# Patient Record
Sex: Female | Born: 1935 | Race: Black or African American | Hispanic: No | Marital: Married | Smoking: Former smoker
Health system: Southern US, Community
[De-identification: ages and names within clinical notes are randomized; demographics above are authoritative.]

## PROBLEM LIST (undated history)

## (undated) DIAGNOSIS — E119 Type 2 diabetes mellitus without complications: Secondary | ICD-10-CM

## (undated) DIAGNOSIS — F0390 Unspecified dementia without behavioral disturbance: Secondary | ICD-10-CM

## (undated) DIAGNOSIS — M1990 Unspecified osteoarthritis, unspecified site: Secondary | ICD-10-CM

---

## 2004-03-08 ENCOUNTER — Ambulatory Visit: Payer: Self-pay | Admitting: Unknown Physician Specialty

## 2005-02-28 ENCOUNTER — Ambulatory Visit: Payer: Self-pay | Admitting: Gastroenterology

## 2005-03-19 ENCOUNTER — Ambulatory Visit: Payer: Self-pay | Admitting: Unknown Physician Specialty

## 2006-04-16 ENCOUNTER — Ambulatory Visit: Payer: Self-pay | Admitting: Unknown Physician Specialty

## 2007-04-22 ENCOUNTER — Ambulatory Visit: Payer: Self-pay | Admitting: Unknown Physician Specialty

## 2008-01-21 ENCOUNTER — Ambulatory Visit: Payer: Self-pay | Admitting: Rheumatology

## 2008-02-06 ENCOUNTER — Ambulatory Visit: Payer: Self-pay

## 2008-03-16 ENCOUNTER — Ambulatory Visit: Payer: Self-pay | Admitting: Unknown Physician Specialty

## 2008-03-17 ENCOUNTER — Inpatient Hospital Stay: Payer: Self-pay | Admitting: Unknown Physician Specialty

## 2008-08-03 ENCOUNTER — Ambulatory Visit: Payer: Self-pay | Admitting: Unknown Physician Specialty

## 2008-08-17 ENCOUNTER — Ambulatory Visit: Payer: Self-pay | Admitting: Unknown Physician Specialty

## 2009-08-21 ENCOUNTER — Ambulatory Visit: Payer: Self-pay | Admitting: Unknown Physician Specialty

## 2010-07-19 ENCOUNTER — Ambulatory Visit: Payer: Self-pay | Admitting: Gastroenterology

## 2010-09-03 ENCOUNTER — Ambulatory Visit: Payer: Self-pay | Admitting: Unknown Physician Specialty

## 2011-11-26 ENCOUNTER — Ambulatory Visit: Payer: Self-pay | Admitting: Unknown Physician Specialty

## 2012-12-01 ENCOUNTER — Ambulatory Visit: Payer: Self-pay | Admitting: Physician Assistant

## 2013-05-20 HISTORY — PX: CARDIAC SURGERY: SHX584

## 2013-05-21 ENCOUNTER — Ambulatory Visit: Payer: Self-pay | Admitting: Cardiology

## 2013-09-24 DIAGNOSIS — E119 Type 2 diabetes mellitus without complications: Secondary | ICD-10-CM | POA: Insufficient documentation

## 2013-09-24 DIAGNOSIS — I351 Nonrheumatic aortic (valve) insufficiency: Secondary | ICD-10-CM | POA: Insufficient documentation

## 2013-09-24 DIAGNOSIS — I10 Essential (primary) hypertension: Secondary | ICD-10-CM | POA: Insufficient documentation

## 2013-09-24 DIAGNOSIS — M1990 Unspecified osteoarthritis, unspecified site: Secondary | ICD-10-CM | POA: Insufficient documentation

## 2013-09-24 DIAGNOSIS — E039 Hypothyroidism, unspecified: Secondary | ICD-10-CM | POA: Insufficient documentation

## 2013-09-24 DIAGNOSIS — I340 Nonrheumatic mitral (valve) insufficiency: Secondary | ICD-10-CM | POA: Insufficient documentation

## 2013-09-24 DIAGNOSIS — R0602 Shortness of breath: Secondary | ICD-10-CM | POA: Insufficient documentation

## 2013-09-24 DIAGNOSIS — I350 Nonrheumatic aortic (valve) stenosis: Secondary | ICD-10-CM | POA: Insufficient documentation

## 2013-09-28 DIAGNOSIS — I4510 Unspecified right bundle-branch block: Secondary | ICD-10-CM | POA: Insufficient documentation

## 2014-02-18 DIAGNOSIS — I442 Atrioventricular block, complete: Secondary | ICD-10-CM | POA: Insufficient documentation

## 2014-02-18 DIAGNOSIS — I9789 Other postprocedural complications and disorders of the circulatory system, not elsewhere classified: Secondary | ICD-10-CM

## 2014-03-01 DIAGNOSIS — Z952 Presence of prosthetic heart valve: Secondary | ICD-10-CM | POA: Insufficient documentation

## 2014-03-11 DIAGNOSIS — Z950 Presence of cardiac pacemaker: Secondary | ICD-10-CM | POA: Insufficient documentation

## 2014-05-26 ENCOUNTER — Observation Stay: Payer: Self-pay | Admitting: Internal Medicine

## 2014-05-26 LAB — COMPREHENSIVE METABOLIC PANEL
ANION GAP: 8 (ref 7–16)
Albumin: 3.9 g/dL (ref 3.4–5.0)
Alkaline Phosphatase: 72 U/L
BILIRUBIN TOTAL: 0.4 mg/dL (ref 0.2–1.0)
BUN: 12 mg/dL (ref 7–18)
CO2: 26 mmol/L (ref 21–32)
CREATININE: 0.86 mg/dL (ref 0.60–1.30)
Calcium, Total: 9.7 mg/dL (ref 8.5–10.1)
Chloride: 107 mmol/L (ref 98–107)
Glucose: 95 mg/dL (ref 65–99)
OSMOLALITY: 281 (ref 275–301)
POTASSIUM: 3.9 mmol/L (ref 3.5–5.1)
SGOT(AST): 18 U/L (ref 15–37)
SGPT (ALT): 22 U/L
SODIUM: 141 mmol/L (ref 136–145)
Total Protein: 8.1 g/dL (ref 6.4–8.2)

## 2014-05-26 LAB — LIPID PANEL
Cholesterol: 204 mg/dL — ABNORMAL HIGH (ref 0–200)
HDL Cholesterol: 104 mg/dL — ABNORMAL HIGH (ref 40–60)
Ldl Cholesterol, Calc: 91 mg/dL (ref 0–100)
TRIGLYCERIDES: 45 mg/dL (ref 0–200)
VLDL CHOLESTEROL, CALC: 9 mg/dL (ref 5–40)

## 2014-05-26 LAB — PROTIME-INR
INR: 1
Prothrombin Time: 13.3 secs (ref 11.5–14.7)

## 2014-05-26 LAB — CBC WITH DIFFERENTIAL/PLATELET
Basophil #: 0 10*3/uL (ref 0.0–0.1)
Basophil %: 0.4 %
Eosinophil #: 0.1 10*3/uL (ref 0.0–0.7)
Eosinophil %: 1.2 %
HCT: 37.1 % (ref 35.0–47.0)
HGB: 11.7 g/dL — ABNORMAL LOW (ref 12.0–16.0)
LYMPHS ABS: 1.4 10*3/uL (ref 1.0–3.6)
LYMPHS PCT: 31.2 %
MCH: 30.4 pg (ref 26.0–34.0)
MCHC: 31.5 g/dL — ABNORMAL LOW (ref 32.0–36.0)
MCV: 97 fL (ref 80–100)
Monocyte #: 0.5 x10 3/mm (ref 0.2–0.9)
Monocyte %: 10.2 %
Neutrophil #: 2.6 10*3/uL (ref 1.4–6.5)
Neutrophil %: 57 %
PLATELETS: 183 10*3/uL (ref 150–440)
RBC: 3.84 10*6/uL (ref 3.80–5.20)
RDW: 17.1 % — AB (ref 11.5–14.5)
WBC: 4.5 10*3/uL (ref 3.6–11.0)

## 2014-05-26 LAB — TROPONIN I

## 2014-05-26 LAB — SEDIMENTATION RATE: Erythrocyte Sed Rate: 17 mm/hr (ref 0–30)

## 2014-05-26 LAB — APTT: ACTIVATED PTT: 35.1 s (ref 23.6–35.9)

## 2014-05-27 LAB — CBC WITH DIFFERENTIAL/PLATELET
Basophil #: 0 10*3/uL (ref 0.0–0.1)
Basophil %: 0.3 %
EOS PCT: 2.4 %
Eosinophil #: 0.1 10*3/uL (ref 0.0–0.7)
HCT: 34.2 % — ABNORMAL LOW (ref 35.0–47.0)
HGB: 11 g/dL — ABNORMAL LOW (ref 12.0–16.0)
Lymphocyte #: 2.3 10*3/uL (ref 1.0–3.6)
Lymphocyte %: 46.8 %
MCH: 30.5 pg (ref 26.0–34.0)
MCHC: 32.2 g/dL (ref 32.0–36.0)
MCV: 95 fL (ref 80–100)
MONOS PCT: 12.3 %
Monocyte #: 0.6 x10 3/mm (ref 0.2–0.9)
NEUTROS PCT: 38.2 %
Neutrophil #: 1.9 10*3/uL (ref 1.4–6.5)
PLATELETS: 182 10*3/uL (ref 150–440)
RBC: 3.6 10*6/uL — ABNORMAL LOW (ref 3.80–5.20)
RDW: 16.7 % — ABNORMAL HIGH (ref 11.5–14.5)
WBC: 4.9 10*3/uL (ref 3.6–11.0)

## 2014-05-27 LAB — BASIC METABOLIC PANEL
ANION GAP: 6 — AB (ref 7–16)
BUN: 11 mg/dL (ref 7–18)
CHLORIDE: 110 mmol/L — AB (ref 98–107)
Calcium, Total: 9.6 mg/dL (ref 8.5–10.1)
Co2: 27 mmol/L (ref 21–32)
Creatinine: 0.83 mg/dL (ref 0.60–1.30)
EGFR (African American): >60
EGFR (Non-African Amer.): >60
GLUCOSE: 101 mg/dL — AB (ref 65–99)
OSMOLALITY: 285 (ref 275–301)
Potassium: 3.9 mmol/L (ref 3.5–5.1)
Sodium: 143 mmol/L (ref 136–145)

## 2014-05-27 LAB — HEMOGLOBIN A1C: HEMOGLOBIN A1C: 6.2 % (ref 4.2–6.3)

## 2014-05-30 DIAGNOSIS — E782 Mixed hyperlipidemia: Secondary | ICD-10-CM | POA: Insufficient documentation

## 2014-05-30 DIAGNOSIS — I10 Essential (primary) hypertension: Secondary | ICD-10-CM | POA: Insufficient documentation

## 2014-05-30 DIAGNOSIS — E119 Type 2 diabetes mellitus without complications: Secondary | ICD-10-CM | POA: Insufficient documentation

## 2014-09-18 NOTE — H&P (Signed)
PATIENT NAME:  [name], [name] MR#:  [id number] DATE OF BIRTH:  10/06/1926  DATE OF ADMISSION:  05/26/2014  PRIMARY CARE PHYSICIAN:  Zoe Singh, MD   EMERGENCY ROOM PHYSICIAN: David Schaevitz, MD   CHIEF COMPLAINT: Vision loss in the left eye.   HISTORY OF PRESENT ILLNESS: An 81-year-old female patient with history of high blood pressure, diabetes, brought in because of the vision changes. Mainly, she lost her vision in the left eye briefly for an hour. The patient noticed she that could not see anything out of the left eye around 9:00 a.m. and it persisted until 10:00 a.m. The patient started to see things again from the left eye after 1 hour. The patient did not have any headache, did not have syncope, no weakness in hands or legs. No facial droop, no slurred speech. The patient denies any fever, did not have any previous symptoms like this. The patient has no previous history of strokes.   PAST MEDICAL HISTORY: Significant for high blood pressure, diabetes, history of pacemaker placement, recent open heart surgery at [facility] back in October. The patient is not sure of what surgery she had. , but she is unable to tell me what surgery she had. The patient also has history of hypothyroidism and hyperlipidemia.   ALLERGIES: No known allergies.   MEDICATIONS: Aspirin 81 mg daily, atorvastatin 20 mg daily, fish oil 1 gram p.o. t.i.d., levothyroxine 112 mcg p.o. daily, oxycodone 5 mg every 6 hours, Actos 30 mg p.o. daily, Tylenol 500 mg every 6 hours p.r.n. as needed for pain and fever.   PAST SURGICAL HISTORY:>, history of hysterectomy, pacemaker replacement, right knee replacement, and open heart surgery in October of last year.   SOCIAL HISTORY: The patient is an occasional smoker, quit now, never was a heavy smoker. No alcohol. No drugs. Lives alone, independent of activities of daily living.   FAMILY HISTORY: Mother has diabetes. Sisters have diabetes. Nephews have diabetes.  REVIEW OF  SYSTEMS:  CONSTITUTIONAL: The patient has no fever, no fatigue.  EYES: The patient lost vision the left eye briefly and now denies any blurred vision.  EARS, NOSE, AND THROAT: No tinnitus, no epistaxis, no difficulty swallowing.  RESPIRATORY: No cough. No wheezing.  CARDIOVASCULAR: No chest pain, no orthopnea, no PND, no pedal edema.  GASTROINTESTINAL: No nausea. No vomiting. No abdominal pain.  GENITOURINARY: No dysuria or hematuria.  ENDOCRINE: No polyuria or polydipsia.  HEMATOLOGIC: No anemia.  INTEGUMENTARY: No skin rashes. The patient has no acne.  MUSCULOSKELETAL: Denies any joint pains, no gout.  NEUROLOGIC: Denies any numbness, no dysarthria, no epilepsy, no tremors, no headache, no seizures, no TIA. The patient has no migraines. Lost vision in the left eye briefly today.  PSYCHIATRIC: No anxiety or insomnia. No nervousness or anxiety.   PHYSICAL EXAMINATION: VITAL SIGNS: Temperature 98 Fahrenheit, heart rate 86, blood pressure 193/100, and saturations 93% in room air.  GENERAL: The patient is an awake, alert, oriented elderly African American female, not in distress, answering questions appropriately.  HEAD: Normocephalic, atraumatic.  EYES: Pupils equal, reacting to light. Right now, her visual acuity is normal on vision test, able to see from both eyes. Extraocular movements are intact. The patient's pupils are equal and reacting to light. The patient has no scleral icterus, and able to see clearly from both eyes and visual field testing is normal in both eyes.  NOSE: No nasal drainage and no external lesions.  EARS: No drainage,  tympanic membranes are clear. MOUTH:   No lesions.   NECK: Supple. No JVD. No carotid bruit. Normal range of motion.  RESPIRATORY: Good respiratory effort. Clear to auscultation. No wheeze. No rales.  CARDIOVASCULAR: S1, S2 regular. PMI not displaced. The patient has no murmurs. 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 No peripheral edema.  GASTROINTESTINAL: Abdomen is soft, nontender, nondistended. Bowel sounds present. No hernias.  MUSCULOSKELETAL: Normal gait and station. No pathology. No tenderness or effusion. Normal range of motion. Strength and tone are equal bilaterally.  SKIN: Inspection is normal, well-hydrated.  NEUROLOGIC: Alert, awake, oriented. Cranial nerves II through XII are intact. Power 5/5 in upper and lower extremities. Sensation is intact. DTRs 2+ bilaterally. The patient's visual acuity,  extraocular movements, and visual field defects are completely normal. Her visual fields are completely normal in both eyes at this time.  PSYCHIATRIC: Mood and affect are within normal limits.   LABORATORY DATA: INR 1. Electrolytes: Sodium 141, potassium 3.9, chloride 107, bicarbonate 26, BUN 12, creatinine 0.86, glucose 95. LFTs within normal limits. CT head shows no intracranial hemorrhage. Small vessel-type changes without any evidence of acute ischemia. CBC: WBC 4.5, hemoglobin 11.7, hematocrit 37.1, platelets 183. EKG: Atrial pacer rhythm with a prolonged AV conduction, 82 beats per minute.   ASSESSMENT AND PLAN:  1. This is an 81-year-old female patient with amaurosis fugax with vision loss in the left eye. Differential includes possible emboli versus Central retinal artery occlusion versus possible giant cell arteritis versus vasculitis. The patient is admitted to telemetry. We were going to do carotid ultrasound, echocardiogram, and MRI of the brain to evaluate for stroke. The patient is also on telemetry for (possible arrhythmias.  2. Possible giant cell arteritis. That can cause transient monocular vision loss, so I ordered ESR and CRP, and I called ophthalmology on call. He suggested that if the patient  vision is back to normal, he would see her in the office.  I called and talked to her grandson .  3. The patient may need outpatient fundoscopy evaluation to evaluate for any retinal emboli   The patient is awaiting carotid ultrasound to evaluate for any thromboembolic phenomena, and MRIof the brain and echocardiogram. The patient had a heart surgery, but unable to tell me what surgery she had. If the work-up is negative, she may need a TEE. The patient is right now on aspirin and statins. Continue them, and further work-up based on the testing, and a neurology consult will be ordered, and the patient will be signed out to [facility].  4. Continue other medications for hypothyroidism and hyperlipidemia.   TIME SPENT: 55 minutes.    ____________________________  , MD sk:mw D: 05/26/2014 18:12:27 ET T: 05/26/2014 19:04:38 ET JOB#: [id number]  cc:  , MD, <Dictator>   MD ELECTRONICALLY SIGNED 06/22/2014 16:15 

## 2014-09-20 ENCOUNTER — Encounter: Payer: Medicare Other | Attending: Cardiology | Admitting: *Deleted

## 2014-09-20 ENCOUNTER — Encounter: Payer: Self-pay | Admitting: *Deleted

## 2014-09-20 VITALS — Ht 68.0 in | Wt 197.2 lb

## 2014-09-20 DIAGNOSIS — Z954 Presence of other heart-valve replacement: Secondary | ICD-10-CM | POA: Insufficient documentation

## 2014-09-20 DIAGNOSIS — Z952 Presence of prosthetic heart valve: Secondary | ICD-10-CM

## 2014-09-20 NOTE — Patient Instructions (Signed)
Patient Instructions  Patient Details  Name: Zoe [name] MRN: [id number] Date of Birth: [birth year] Referring Provider:  Dalia HeadingFath, Kenneth A, MD  Below are the personal goals you chose as well as exercise and nutrition goals. Our goal is to help you keep on track towards obtaining and maintaining your goals. We will be discussing your progress on these goals with you throughout the program.  Initial Exercise Prescription:     Initial Exercise Prescription - 09/20/14 1000    Treadmill   MPH 1.5   Grade 0   Minutes 10   Bike   Level 0.4   Minutes 10   Recumbant Bike   Level 2   Watts 15   Minutes 10   NuStep   Level 2   Watts 35   Minutes 15   Arm Ergometer   Level 1   Watts 8   Minutes 10   Arm/Foot Ergometer   Level 2   Watts 10   Minutes 10   Cybex   Level 1   RPM 50   Minutes 10   Recumbant Elliptical   Level 1   RPM 40   Watts 10   Minutes 10   REL-XR   Level 1   Watts 20   Minutes 10   Prescription Details   Frequency (times per week) 3   Duration Progress to 30 minutes of continuous aerobic without signs/symptoms of physical distress   Intensity   THRR REST +  20   Ratings of Perceived Exertion 11-15   Progression Continue progressive overload as per policy without signs/symptoms or physical distress.   Resistance Training   Training Prescription Yes   Weight 1      Exercise Goals: Frequency: Be able to perform aerobic exercise three times per week working toward 3-5 days per week.  Intensity: Work with a perceived exertion of 11 (fairly light) - 15 (hard) as tolerated. Follow your new exercise prescription and watch for changes in prescription as you progress with the program. Changes will be reviewed with you when they are made.  Duration: You should be able to do 30 minutes of continuous aerobic exercise in addition to a 5 minute warm-up and a 5 minute cool-down routine.  Nutrition Goals: Your personal nutrition goals will be established  when you do your nutrition analysis with the dietician.  The following are nutrition guidelines to follow: Cholesterol < 200mg /day Sodium < 1500mg /day Fiber: Women over 50 yrs - 21 grams per day  Personal Goals:     Personal Goals and Risk Factors at Admission - 09/20/14 0908    Personal Goals and Risk Factors on Admission    Weight Management Yes   Intervention Learn and follow the exercise and diet guidelines while in the program. Utilize the nutrition and education classes to help gain knowledge of the diet and exercise expectations in the program   Increase Aerobic Exercise and Physical Activity Yes;Sedentary   Intervention While in program, learn and follow the exercise prescription taught. Start at a low level workload and increase workload after able to maintain previous level for 30 minutes. Increase time before increasing intensity.   Intervention Provide exercise education and an individualized exercise prescription that will provide continued progressive overload as per policy without signs/symptoms of physical distress.   Diabetes Yes   Goal Blood glucose control identified by blood glucose values, HgbA1C. Participant verbalizes understanding of the signs/symptoms of hyper/hypo glycemia, proper foot care and importance of medication and nutrition  plan for blood glucose control.   Intervention Provide nutrition & aerobic exercise along with prescribed medications to achieve blood glucose in normal ranges: Fasting 65-99 mg/dL   Hypertension Yes   Goal Participant will see blood pressure controlled within the values of 140/28mm/Hg or within value directed by their physician.   Intervention Provide nutrition & aerobic exercise along with prescribed medications to achieve BP 140/90 or less.   Lipids Yes   Goal Cholesterol controlled with medications as prescribed, with individualized exercise RX and with personalized nutrition plan. Value goals: LDL < , HDL > . Participant  states understanding of desired cholesterol values and following prescriptions.   Intervention Provide nutrition & aerobic exercise along with prescribed medications to achieve LDL 70mg , HDL >40mg .   Stress No      Tobacco Use Initial Evaluation: History  Smoking status  . Former Smoker -- 0.25 packs/day for 5 years  . Types: Cigarettes  . Quit date: 05/22/1968  Smokeless tobacco  . Not on file    Copy of goals given to participant.

## 2014-09-20 NOTE — Progress Notes (Signed)
Cardiac Individual Treatment Plan  Patient Details  Name: Zoe [name] MRN: [id number] Date of Birth: 1927/02/20 Referring Provider:  Dalia Heading, MD  Initial Encounter Date:   09/20/2014   Visit Diagnosis: S/P aortic valve replacement    Patient's Home Medications on Admission:  Current outpatient prescriptions:  .  atorvastatin (LIPITOR) 20 MG tablet, Take by mouth., Disp: , Rfl:  .  Bioflavonoid Products (BIOFLEX PO), Take 1 tablet by mouth every morning., Disp: , Rfl:  .  levothyroxine (SYNTHROID, LEVOTHROID) 88 MCG tablet, TAKE 1 TABLET DAILY ON AN EMPTY STOMACH WITH A GLASS OF WATER AT LEAST 30 TO 60 MINUTES BEFORE BREAKFAST, Disp: , Rfl:  .  metoprolol succinate (TOPROL-XL) 50 MG 24 hr tablet, Take by mouth., Disp: , Rfl:  .  Omega-3 Fatty Acids (FISH OIL) 1000 MG CAPS, Take 2 capsules by mouth every morning., Disp: , Rfl:  .  pioglitazone (ACTOS) 30 MG tablet, TAKE 1 TABLET EVERY MORNING, Disp: , Rfl:  .  acetaminophen (TYLENOL) 325 MG tablet, Take by mouth., Disp: , Rfl:  .  clopidogrel (PLAVIX) 75 MG tablet, Take by mouth., Disp: , Rfl:   Past Medical History: Hypertension Hyperlipidemia Diabetes Pacemaker   11/2013 Complete heart block Hypothyriodism AVR-02/2014   Tobacco Use: History  Smoking status  . Former Smoker -- 0.25 packs/day for 5 years  . Types: Cigarettes  . Quit date: 05/22/1968  Smokeless tobacco  . Not on file    Labs: Recent Review Flowsheet Data    There is no flowsheet data to display.       Exercise Target Goals:    Exercise Program Goal: Individual exercise prescription set with THRR, safety & activity barriers. Participant demonstrates ability to understand and report RPE using BORG scale, to self-measure pulse accurately, and to acknowledge the importance of the exercise prescription.  Exercise Prescription Goal: Starting with aerobic activity 30 plus minutes a day, 3 days per week for initial exercise prescription.  Provide home exercise prescription and guidelines that participant acknowledges understanding prior to discharge.  Activity Barriers & Risk Stratification:   6 Minute Walk:     6 Minute Walk      09/20/14 1002       6 Minute Walk   Phase Initial     Distance 1070 feet     Walk Time 6 minutes     Resting HR 84 bpm     Resting BP 162/88 mmHg     Max Ex. HR 111 bpm     Max Ex. BP 162/90 mmHg     RPE 11     Symptoms No        Initial Exercise Prescription:     Initial Exercise Prescription - 09/20/14 1000    Treadmill   MPH 1.5   Grade 0   Minutes 10   Bike   Level 0.4   Minutes 10   Recumbant Bike   Level 2   Watts 15   Minutes 10   NuStep   Level 2   Watts 35   Minutes 15   Arm Ergometer   Level 1   Watts 8   Minutes 10   Arm/Foot Ergometer   Level 2   Watts 10   Minutes 10   Cybex   Level 1   RPM 50   Minutes 10   Recumbant Elliptical   Level 1   RPM 40   Watts 10   Minutes 10   REL-XR  Level 1   Watts 20   Minutes 10   Prescription Details   Frequency (times per week) 3   Duration Progress to 30 minutes of continuous aerobic without signs/symptoms of physical distress   Intensity   THRR REST +  20   Ratings of Perceived Exertion 11-15   Progression Continue progressive overload as per policy without signs/symptoms or physical distress.   Resistance Training   Training Prescription Yes   Weight 1      Exercise Prescription Changes:   Discharge Exercise Prescription:   Nutrition:  Target Goals: Understanding of nutrition guidelines, daily intake of sodium 1500mg , cholesterol 200mg , calories 30% from fat and 7% or less from saturated fats, daily to have 5 or more servings of fruits and vegetables.  Biometrics:     Pre Biometrics - 09/20/14 1010    Pre Biometrics   Height  (1.727 m)   Weight 197 lb 3.2 oz (89.449 kg)   Waist Circumference 33 inches   Hip Circumference 44.5 inches   Waist to Hip Ratio 0.74 %   BMI  (Calculated) 30       Nutrition Therapy Plan and Nutrition Goals:   Nutrition Discharge: Rate Your Plate Scores:   Nutrition Goals Re-Evaluation:   Psychosocial: Target Goals: Acknowledge presence or absence of depression, maximize coping skills, provide positive support system. Participant is able to verbalize types and ability to use techniques and skills needed for reducing stress and depression.  Initial Review & Psychosocial Screening:     Initial Psych Review & Screening - 09/20/14 0912    Family Dynamics   Good Support System? Yes   Barriers   Psychosocial barriers to participate in program There are no identifiable barriers or psychosocial needs.;The patient should benefit from training in stress management and relaxation.   Screening Interventions   Interventions Encouraged to exercise      Quality of Life Scores:   PHQ-9:     Recent Review Flowsheet Data    Depression screen [facility] 2/9 09/20/2014   Decreased Interest 2   Down, Depressed, Hopeless 0   PHQ - 2 Score 2   Altered sleeping 0   Tired, decreased energy 0   Change in appetite 0   Feeling bad or failure about yourself  0   Trouble concentrating 0   Moving slowly or fidgety/restless 0   Suicidal thoughts 0   PHQ-9 Score 2   Difficult doing work/chores Not difficult at all      Psychosocial Evaluation and Intervention:   Psychosocial Re-Evaluation:   Vocational Rehabilitation: Provide vocational rehab assistance to qualifying candidates.   Vocational Rehab Evaluation & Intervention:     Vocational Rehab - 09/20/14 0903    Initial Vocational Rehab Evaluation & Intervention   Assessment shows need for Vocational Rehabilitation No      Education: Education Goals: Education classes will be provided on a weekly basis, covering required topics. Participant will state understanding/return demonstration of topics presented.  Learning Barriers/Preferences:     Learning  Barriers/Preferences - 09/20/14 1610    Learning Barriers/Preferences   Learning Barriers None   Learning Preferences Written Material      Education Topics: General Nutrition Guidelines/Fats and Fiber: -Group instruction provided by verbal, written material, models and posters to present the general guidelines for heart healthy nutrition. Gives an explanation and review of dietary fats and fiber.   Controlling Sodium/Reading Food Labels: -Group verbal and written material supporting the discussion of sodium use in heart healthy  nutrition. Review and explanation with models, verbal and written materials for utilization of the food label.   Exercise Physiology & Risk Factors: - Group verbal and written instruction with models to review the exercise physiology of the cardiovascular system and associated critical values. Details cardiovascular disease risk factors and the goals associated with each risk factor.   Aerobic Exercise & Resistance Training: - Gives group verbal and written discussion on the health impact of inactivity. On the components of aerobic and resistive training programs and the benefits of this training and how to safely progress through these programs.   Flexibility, Balance, General Exercise Guidelines: - Provides group verbal and written instruction on the benefits of flexibility and balance training programs. Provides general exercise guidelines with specific guidelines to those with heart or lung disease. Demonstration and skill practice provided.   Stress Management: - Provides group verbal and written instruction about the health risks of elevated stress, cause of high stress, and healthy ways to reduce stress.   Depression: - Provides group verbal and written instruction on the correlation between heart/lung disease and depressed mood, treatment options, and the stigmas associated with seeking treatment.   Anatomy & Physiology of the Heart: - Group  verbal and written instruction and models provide basic cardiac anatomy and physiology, with the coronary electrical and arterial systems. Review of: AMI, Angina, Valve disease, Heart Failure, Cardiac Arrhythmia, Pacemakers, and the ICD.   Cardiac Procedures: - Group verbal and written instruction and models to describe the testing methods done to diagnose heart disease. Reviews the outcomes of the test results. Describes the treatment choices: Medical Management, Angioplasty, or Coronary Bypass Surgery.   Cardiac Medications: - Group verbal and written instruction to review commonly prescribed medications for heart disease. Reviews the medication, class of the drug, and side effects. Includes the steps to properly store meds and maintain the prescription regimen.   Go Sex-Intimacy & Heart Disease, Get SMART - Goal Setting: - Group verbal and written instruction through game format to discuss heart disease and the return to sexual intimacy. Provides group verbal and written material to discuss and apply goal setting through the application of the S.M.A.R.T. Method.   Other Matters of the Heart: - Provides group verbal, written materials and models to describe Heart Failure, Angina, Valve Disease, and Diabetes in the realm of heart disease. Includes description of the disease process and treatment options available to the cardiac patient.   Exercise & Equipment Safety: - Individual verbal instruction and demonstration of equipment use and safety with use of the equipment.          Cardiac Rehab from 09/20/2014 in [facility] Cardiac Rehab   Date  09/20/14   Educator  S Duayne Brideau   Instruction Review Code  2- meets goals/outcomes      Infection Prevention: - Provides verbal and written material to individual with discussion of infection control including proper hand washing and proper equipment cleaning during exercise session.      Cardiac Rehab from 09/20/2014 in [facility] Cardiac Rehab   Date  09/20/14    Educator  S Lanaiya Lantry   Instruction Review Code  2- meets goals/outcomes      Falls Prevention: - Provides verbal and written material to individual with discussion of falls prevention and safety.      Cardiac Rehab from 09/20/2014 in [facility] Cardiac Rehab   Date  09/20/14   Educator  S Eleaner Dibartolo   Instruction Review Code  2- meets goals/outcomes  Diabetes: - Individual verbal and written instruction to review signs/symptoms of diabetes, desired ranges of glucose level fasting, after meals and with exercise. Advice that pre and post exercise glucose checks will be done for 3 sessions at entry of program.      [facility]   Date  09/20/14   Educator  S Koree Staheli   Instruction Review Code  2- meets goals/outcomes       Knowledge Questionnaire Score:     Knowledge Questionnaire Score - 09/20/14 1050    Knowledge Questionnaire Score   Pre Score 20/28      Personal Goals and Risk Factors at Admission:     Personal Goals and Risk Factors at Admission - 09/20/14 0908    Personal Goals and Risk Factors on Admission    Weight Management Yes   Intervention Learn and follow the exercise and diet guidelines while in the program. Utilize the nutrition and education classes to help gain knowledge of the diet and exercise expectations in the program   Increase Aerobic Exercise and Physical Activity Yes;Sedentary   Intervention While in program, learn and follow the exercise prescription taught. Start at a low level workload and increase workload after able to maintain previous level for 30 minutes. Increase time before increasing intensity.   Intervention Provide exercise education and an individualized exercise prescription that will provide continued progressive overload as per policy without signs/symptoms of physical distress.   Diabetes Yes   Goal Blood glucose control identified by blood glucose values, HgbA1C. Participant verbalizes understanding of the  signs/symptoms of hyper/hypo glycemia, proper foot care and importance of medication and nutrition plan for blood glucose control.   Intervention Provide nutrition & aerobic exercise along with prescribed medications to achieve blood glucose in normal ranges: Fasting 65-99 mg/dL   Hypertension Yes   Goal Participant will see blood pressure controlled within the values of 140/2990mm/Hg or within value directed by their physician.   Intervention Provide nutrition & aerobic exercise along with prescribed medications to achieve BP 140/90 or less.   Lipids Yes   Goal Cholesterol controlled with medications as prescribed, with individualized exercise RX and with personalized nutrition plan. Value goals: LDL < 70mg , HDL > 40mg . Participant states understanding of desired cholesterol values and following prescriptions.   Intervention Provide nutrition & aerobic exercise along with prescribed medications to achieve LDL 70mg , HDL >40mg .   Stress No      Personal Goals and Risk Factors Review:    Personal Goals Discharge:     Comments: Initial Treatment Plan Completed 09/20/2014

## 2014-09-26 ENCOUNTER — Encounter: Payer: Medicare Other | Admitting: *Deleted

## 2014-09-26 DIAGNOSIS — Z954 Presence of other heart-valve replacement: Secondary | ICD-10-CM | POA: Diagnosis not present

## 2014-09-26 DIAGNOSIS — Z952 Presence of prosthetic heart valve: Secondary | ICD-10-CM

## 2014-09-26 LAB — GLUCOSE, CAPILLARY
GLUCOSE-CAPILLARY: 86 mg/dL (ref 70–99)
Glucose-Capillary: 72 mg/dL (ref 70–99)

## 2014-09-26 NOTE — Progress Notes (Signed)
Daily Session Note  Patient Details  Name: Zoe [name] MRN: [id number] Date of Birth: April 01, 1927 Referring Provider:  Teodoro Spray, MD  Encounter Date: 09/26/2014  Check In:     Session Check In - 09/26/14 1126    Check-In   Staff Present Heath Lark RN, BSN, CCRP;Anesha Hackert BS, ACSM CEP Exercise Physiologist;Renee De Graff MS, ACSM CEP Exercise Physiologist   ER physicians immediately available to respond to emergencies See telemetry face sheet for immediately available ER MD   Warm-up and Cool-down Performed on first and last piece of equipment   VAD Patient? No   Pain Assessment   Currently in Pain? No/denies   Multiple Pain Sites No         Goals Met:  Proper associated with RPD/PD & O2 Sat Independence with exercise equipment Exercise tolerated well No cardiac symptoms reported with exercise.  Goals Unmet:  Not Applicable  Goals Comments:    Dr. Emily Filbert is Medical Director for [place] and [facility].

## 2014-09-28 DIAGNOSIS — Z952 Presence of prosthetic heart valve: Secondary | ICD-10-CM

## 2014-09-28 DIAGNOSIS — Z954 Presence of other heart-valve replacement: Secondary | ICD-10-CM | POA: Diagnosis not present

## 2014-09-28 LAB — GLUCOSE, CAPILLARY
GLUCOSE-CAPILLARY: 89 mg/dL (ref 70–99)
Glucose-Capillary: 99 mg/dL (ref 70–99)

## 2014-09-28 NOTE — Progress Notes (Signed)
Daily Session Note  Patient Details  Name: DALTON MILLE MRN: [id number] Date of Birth: [birth year] Referring Provider:  Teodoro Spray, MD  Encounter Date: 09/28/2014  Check In:     Session Check In - 09/28/14 0858    Check-In   Staff Present Heath Lark RN, BSN, CCRP;Alassane Kalafut BS, ACSM EP-C, Exercise Physiologist;Renee Dillard Essex MS, ACSM CEP Exercise Physiologist   ER physicians immediately available to respond to emergencies See telemetry face sheet for immediately available ER MD   Medication changes reported     No   Fall or balance concerns reported    No   Warm-up and Cool-down Performed on first and last piece of equipment   VAD Patient? No   Pain Assessment   Currently in Pain? No/denies         Goals Met:  Proper associated with RPD/PD & O2 Sat Exercise tolerated well No report of cardiac concerns or symptoms  Goals Unmet:  Not Applicable  Goals Comments:   Dr. Emily Filbert is Medical Director for [place] and [facility].

## 2014-09-30 ENCOUNTER — Encounter: Payer: Medicare Other | Admitting: *Deleted

## 2014-09-30 DIAGNOSIS — Z952 Presence of prosthetic heart valve: Secondary | ICD-10-CM

## 2014-09-30 DIAGNOSIS — Z954 Presence of other heart-valve replacement: Secondary | ICD-10-CM | POA: Diagnosis not present

## 2014-09-30 NOTE — Progress Notes (Signed)
Daily Session Note  Patient Details  Name: Zoe [name] MRN: [id number] Date of Birth: 06/16/1926 Referring Provider:  Teodoro Spray, MD  Encounter Date: 09/30/2014  Check In:     Session Check In - 09/30/14 0915    Check-In   Staff Present Heath Lark RN, BSN, CCRP;Renee Dillard Essex MS, ACSM CEP Exercise Physiologist;Carroll Enterkin RN, BSN   ER physicians immediately available to respond to emergencies See telemetry face sheet for immediately available ER MD   Medication changes reported     No   Fall or balance concerns reported    No   Warm-up and Cool-down Performed on first and last piece of equipment   VAD Patient? No   Pain Assessment   Currently in Pain? No/denies         Goals Met:  Independence with exercise equipment Exercise tolerated well No report of cardiac concerns or symptoms Strength training completed today  Goals Unmet:  Not Applicable  Goals Comments:    Dr. Emily Filbert is Medical Director for [place] and [facility].

## 2014-10-03 ENCOUNTER — Encounter: Payer: Medicare Other | Admitting: *Deleted

## 2014-10-03 DIAGNOSIS — Z954 Presence of other heart-valve replacement: Secondary | ICD-10-CM | POA: Diagnosis not present

## 2014-10-03 DIAGNOSIS — Z952 Presence of prosthetic heart valve: Secondary | ICD-10-CM

## 2014-10-03 NOTE — Progress Notes (Signed)
Daily Session Note  Patient Details  Name: JET TRAYNHAM MRN: [id number] Date of Birth: July 21, 1926 Referring Provider:  Teodoro Spray, MD  Encounter Date: 10/03/2014  Check In:     Session Check In - 10/03/14 0858    Check-In   Staff Present Heath Lark RN, BSN, CCRP;Kelly Hayes BS, ACSM CEP Exercise Physiologist;Collene Massimino Dillard Essex MS, ACSM CEP Exercise Physiologist   ER physicians immediately available to respond to emergencies See telemetry face sheet for immediately available ER MD   Medication changes reported     No   Fall or balance concerns reported    No   Warm-up and Cool-down Performed on first and last piece of equipment   VAD Patient? No   Pain Assessment   Currently in Pain? No/denies   Multiple Pain Sites No         Goals Met:  Independence with exercise equipment Exercise tolerated well No report of cardiac concerns or symptoms  Goals Unmet:  Not Applicable  Goals Comments:    Dr. Emily Filbert is Medical Director for [place] and [facility].

## 2014-10-05 DIAGNOSIS — Z952 Presence of prosthetic heart valve: Secondary | ICD-10-CM

## 2014-10-05 DIAGNOSIS — Z954 Presence of other heart-valve replacement: Secondary | ICD-10-CM | POA: Diagnosis not present

## 2014-10-05 NOTE — Progress Notes (Signed)
Daily Session Note  Patient Details  Name: Zoe [name] MRN: [id number] Date of Birth: 1926-06-17 Referring Provider:  Teodoro Spray, MD  Encounter Date: 10/05/2014  Check In:     Session Check In - 10/05/14 0844    Check-In   Staff Present Heath Lark RN, BSN, CCRP;Layan Zalenski BS, ACSM EP-C, Exercise Physiologist;Renee Dillard [place] MS, ACSM CEP Exercise Physiologist   ER physicians immediately available to respond to emergencies See telemetry face sheet for immediately available ER MD   Medication changes reported     No   Fall or balance concerns reported    No   Warm-up and Cool-down Performed on first and last piece of equipment   VAD Patient? No   Pain Assessment   Currently in Pain? No/denies         Goals Met:  Proper associated with RPD/PD & O2 Sat Exercise tolerated well No report of cardiac concerns or symptoms Strength training completed today  Goals Unmet:  Not Applicable  Goals Comments:   Dr. Emily Filbert is Medical Director for [place] and [facility].

## 2014-10-07 ENCOUNTER — Encounter: Payer: Medicare Other | Admitting: *Deleted

## 2014-10-07 DIAGNOSIS — Z954 Presence of other heart-valve replacement: Secondary | ICD-10-CM | POA: Diagnosis not present

## 2014-10-07 DIAGNOSIS — Z952 Presence of prosthetic heart valve: Secondary | ICD-10-CM

## 2014-10-07 NOTE — Progress Notes (Signed)
Daily Session Note  Patient Details  Name: RAEVIN WIERENGA MRN: [id number] Date of Birth: 13-Oct-1926 Referring Provider:  Teodoro Spray, MD  Encounter Date: 10/07/2014  Check In:     Session Check In - 10/07/14 1023    Check-In   Staff Present Heath Lark RN, BSN, CCRP;Mithra Spano RN, Drusilla Kanner MS, ACSM CEP Exercise Physiologist   ER physicians immediately available to respond to emergencies See telemetry face sheet for immediately available ER MD   Medication changes reported     No   Fall or balance concerns reported    No   Warm-up and Cool-down Performed on first and last piece of equipment   VAD Patient? No   Pain Assessment   Currently in Pain? No/denies         Goals Met:  Independence with exercise equipment Exercise tolerated well No report of cardiac concerns or symptoms Strength training completed today  Goals Unmet:  Not Applicable  Goals Comments:    Dr. Emily Filbert is Medical Director for [place] and [facility].

## 2014-10-10 ENCOUNTER — Encounter: Payer: Medicare Other | Admitting: *Deleted

## 2014-10-10 DIAGNOSIS — Z952 Presence of prosthetic heart valve: Secondary | ICD-10-CM

## 2014-10-10 DIAGNOSIS — Z954 Presence of other heart-valve replacement: Secondary | ICD-10-CM | POA: Diagnosis not present

## 2014-10-10 NOTE — Progress Notes (Signed)
Daily Session Note  Patient Details  Name: Zoe [name] MRN: [id number] Date of Birth: 1926/07/18 Referring Provider:  Teodoro Spray, MD  Encounter Date: 10/10/2014  Check In:     Session Check In - 10/10/14 0859    Check-In   Staff Present Earlean Shawl BS, ACSM CEP Exercise Physiologist;Renee Dillard Essex MS, ACSM CEP Exercise Physiologist;Susanne Bice RN, BSN, CCRP   ER physicians immediately available to respond to emergencies See telemetry face sheet for immediately available ER MD   Medication changes reported     No   Fall or balance concerns reported    No   Warm-up and Cool-down Performed on first and last piece of equipment   VAD Patient? No   Pain Assessment   Currently in Pain? No/denies   Multiple Pain Sites No         Goals Met:  Independence with exercise equipment Exercise tolerated well No report of cardiac concerns or symptoms  Goals Unmet:  Not Applicable  Goals Comments:    Dr. Emily Filbert is Medical Director for [place] and [facility].

## 2014-10-11 ENCOUNTER — Encounter: Payer: Self-pay | Admitting: *Deleted

## 2014-10-11 NOTE — Progress Notes (Signed)
Cardiac Individual Treatment Plan  Patient Details  Name: Zoe [name] MRN: [id number] Date of Birth: 1927/01/29 Referring Provider:  Dr. Kirtland Bouchard. Fath Initial Encounter Date:  09/20/2014 DiagnosisS/P Aortic Valve Replacement  Patient's Home Medications on Admission:  Current outpatient prescriptions:  .  acetaminophen (TYLENOL) 325 MG tablet, Take by mouth., Disp: , Rfl:  .  atorvastatin (LIPITOR) 20 MG tablet, Take by mouth., Disp: , Rfl:  .  Bioflavonoid Products (BIOFLEX PO), Take 1 tablet by mouth every morning., Disp: , Rfl:  .  clopidogrel (PLAVIX) 75 MG tablet, Take by mouth., Disp: , Rfl:  .  levothyroxine (SYNTHROID, LEVOTHROID) 88 MCG tablet, TAKE 1 TABLET DAILY ON AN EMPTY STOMACH WITH A GLASS OF WATER AT LEAST 30 TO 60 MINUTES BEFORE BREAKFAST, Disp: , Rfl:  .  metoprolol succinate (TOPROL-XL) 50 MG 24 hr tablet, Take by mouth., Disp: , Rfl:  .  Omega-3 Fatty Acids (FISH OIL) 1000 MG CAPS, Take 2 capsules by mouth every morning., Disp: , Rfl:  .  pioglitazone (ACTOS) 30 MG tablet, TAKE 1 TABLET EVERY MORNING, Disp: , Rfl:   Past Medical History: No past medical history on file.  Tobacco Use: History  Smoking status  . Former Smoker -- 0.25 packs/day for 5 years  . Types: Cigarettes  . Quit date: 05/22/1968  Smokeless tobacco  . Not on file    Labs: Recent Review Flowsheet Data    There is no flowsheet data to display.       Exercise Target Goals:    Exercise Program Goal: Individual exercise prescription set with THRR, safety & activity barriers. Participant demonstrates ability to understand and report RPE using BORG scale, to self-measure pulse accurately, and to acknowledge the importance of the exercise prescription.  Exercise Prescription Goal: Starting with aerobic activity 30 plus minutes a day, 3 days per week for initial exercise prescription. Provide home exercise prescription and guidelines that participant acknowledges understanding prior to  discharge.  Activity Barriers & Risk Stratification:   6 Minute Walk:     6 Minute Walk      09/20/14 1002       6 Minute Walk   Phase Initial     Distance 1070 feet     Walk Time 6 minutes     Resting HR 84 bpm     Resting BP 162/88 mmHg     Max Ex. HR 111 bpm     Max Ex. BP 162/90 mmHg     RPE 11     Symptoms No        Initial Exercise Prescription:     Initial Exercise Prescription - 09/20/14 1000    Treadmill   MPH 1.5   Grade 0   Minutes 10   Bike   Level 0.4   Minutes 10   Recumbant Bike   Level 2   Watts 15   Minutes 10   NuStep   Level 2   Watts 35   Minutes 15   Arm Ergometer   Level 1   Watts 8   Minutes 10   Arm/Foot Ergometer   Level 2   Watts 10   Minutes 10   Cybex   Level 1   RPM 50   Minutes 10   Recumbant Elliptical   Level 1   RPM 40   Watts 10   Minutes 10   REL-XR   Level 1   Watts 20   Minutes 10   Prescription Details  Frequency (times per week) 3   Duration Progress to 30 minutes of continuous aerobic without signs/symptoms of physical distress   Intensity   THRR REST +  20   Ratings of Perceived Exertion 11-15   Progression Continue progressive overload as per policy without signs/symptoms or physical distress.   Resistance Training   Training Prescription Yes   Weight 1      Exercise Prescription Changes:     Exercise Prescription Changes      10/05/14 0800 10/11/14 0700         Exercise Review   Progression Yes Yes      Response to Exercise   Blood Pressure (Admit)  136/82 mmHg      Blood Pressure (Exercise)  144/82 mmHg      Blood Pressure (Exit)  120/70 mmHg      Heart Rate (Admit)  82 bpm      Heart Rate (Exercise)  97 bpm      Heart Rate (Exit)  82 bpm      Rating of Perceived Exertion (Exercise)  15      Frequency  Add 1 additional day to program exercise sessions.      Duration  Progress to 30 minutes of continuous aerobic without signs/symptoms of physical distress      Intensity  THRR  unchanged      Progression  Continue progressive overload as per policy without signs/symptoms or physical distress.      Resistance Training   Training Prescription Yes Yes      Weight 2 2      Reps 10-12 10-12      Treadmill   MPH  1.5      Grade  0      Minutes  15      NuStep   Level 3 4      Watts 45 50      Minutes  25         Discharge Exercise Prescription:   Nutrition:  Target Goals: Understanding of nutrition guidelines, daily intake of sodium 1500mg , cholesterol 200mg , calories 30% from fat and 7% or less from saturated fats, daily to have 5 or more servings of fruits and vegetables.  Biometrics:     Pre Biometrics - 09/20/14 1010    Pre Biometrics   Height 5\' 8"  (1.727 m)   Weight 197 lb 3.2 oz (89.449 kg)   Waist Circumference 33 inches   Hip Circumference 44.5 inches   Waist to Hip Ratio 0.74 %   BMI (Calculated) 30       Nutrition Therapy Plan and Nutrition Goals:   Nutrition Discharge: Rate Your Plate Scores:   Nutrition Goals Re-Evaluation:   Psychosocial: Target Goals: Acknowledge presence or absence of depression, maximize coping skills, provide positive support system. Participant is able to verbalize types and ability to use techniques and skills needed for reducing stress and depression.  Initial Review & Psychosocial Screening:     Initial Psych Review & Screening - 09/20/14 0912    Family Dynamics   Good Support System? Yes   Barriers   Psychosocial barriers to participate in program There are no identifiable barriers or psychosocial needs.;The patient should benefit from training in stress management and relaxation.   Screening Interventions   Interventions Encouraged to exercise      Quality of Life Scores:   PHQ-9:     Recent Review Flowsheet Data    Depression screen [facility] 2/9 09/20/2014   Decreased Interest  2   Down, Depressed, Hopeless 0   PHQ - 2 Score 2   Altered sleeping 0   Tired, decreased energy 0   Change  in appetite 0   Feeling bad or failure about yourself  0   Trouble concentrating 0   Moving slowly or fidgety/restless 0   Suicidal thoughts 0   PHQ-9 Score 2   Difficult doing work/chores Not difficult at all      Psychosocial Evaluation and Intervention:   Psychosocial Re-Evaluation:   Vocational Rehabilitation: Provide vocational rehab assistance to qualifying candidates.   Vocational Rehab Evaluation & Intervention:     Vocational Rehab - 09/20/14 0903    Initial Vocational Rehab Evaluation & Intervention   Assessment shows need for Vocational Rehabilitation No      Education: Education Goals: Education classes will be provided on a weekly basis, covering required topics. Participant will state understanding/return demonstration of topics presented.  Learning Barriers/Preferences:     Learning Barriers/Preferences - 09/20/14 1610    Learning Barriers/Preferences   Learning Barriers None   Learning Preferences Written Material      Education Topics: General Nutrition Guidelines/Fats and Fiber: -Group instruction provided by verbal, written material, models and posters to present the general guidelines for heart healthy nutrition. Gives an explanation and review of dietary fats and fiber.   Controlling Sodium/Reading Food Labels: -Group verbal and written material supporting the discussion of sodium use in heart healthy nutrition. Review and explanation with models, verbal and written materials for utilization of the food label.   Exercise Physiology & Risk Factors: - Group verbal and written instruction with models to review the exercise physiology of the cardiovascular system and associated critical values. Details cardiovascular disease risk factors and the goals associated with each risk factor.   Aerobic Exercise & Resistance Training: - Gives group verbal and written discussion on the health impact of inactivity. On the components of aerobic and  resistive training programs and the benefits of this training and how to safely progress through these programs.   Flexibility, Balance, General Exercise Guidelines: - Provides group verbal and written instruction on the benefits of flexibility and balance training programs. Provides general exercise guidelines with specific guidelines to those with heart or lung disease. Demonstration and skill practice provided.   Stress Management: - Provides group verbal and written instruction about the health risks of elevated stress, cause of high stress, and healthy ways to reduce stress.   Depression: - Provides group verbal and written instruction on the correlation between heart/lung disease and depressed mood, treatment options, and the stigmas associated with seeking treatment.   Anatomy & Physiology of the Heart: - Group verbal and written instruction and models provide basic cardiac anatomy and physiology, with the coronary electrical and arterial systems. Review of: AMI, Angina, Valve disease, Heart Failure, Cardiac Arrhythmia, Pacemakers, and the ICD.   Cardiac Procedures: - Group verbal and written instruction and models to describe the testing methods done to diagnose heart disease. Reviews the outcomes of the test results. Describes the treatment choices: Medical Management, Angioplasty, or Coronary Bypass Surgery.   Cardiac Medications: - Group verbal and written instruction to review commonly prescribed medications for heart disease. Reviews the medication, class of the drug, and side effects. Includes the steps to properly store meds and maintain the prescription regimen.   Go Sex-Intimacy & Heart Disease, Get SMART - Goal Setting: - Group verbal and written instruction through game format to discuss heart disease and the return to sexual  intimacy. Provides group verbal and written material to discuss and apply goal setting through the application of the S.M.A.R.T.  Method.   Other Matters of the Heart: - Provides group verbal, written materials and models to describe Heart Failure, Angina, Valve Disease, and Diabetes in the realm of heart disease. Includes description of the disease process and treatment options available to the cardiac patient.   Exercise & Equipment Safety: - Individual verbal instruction and demonstration of equipment use and safety with use of the equipment.   Infection Prevention: - Provides verbal and written material to individual with discussion of infection control including proper hand washing and proper equipment cleaning during exercise session.   Falls Prevention: - Provides verbal and written material to individual with discussion of falls prevention and safety.   Diabetes: - Individual verbal and written instruction to review signs/symptoms of diabetes, desired ranges of glucose level fasting, after meals and with exercise. Advice that pre and post exercise glucose checks will be done for 3 sessions at entry of program.    Knowledge Questionnaire Score:     Knowledge Questionnaire Score - 09/20/14 1050    Knowledge Questionnaire Score   Pre Score 20/28      Personal Goals and Risk Factors at Admission:     Personal Goals and Risk Factors at Admission - 09/20/14 0908    Personal Goals and Risk Factors on Admission    Weight Management Yes   Intervention Learn and follow the exercise and diet guidelines while in the program. Utilize the nutrition and education classes to help gain knowledge of the diet and exercise expectations in the program   Increase Aerobic Exercise and Physical Activity Yes;Sedentary   Intervention While in program, learn and follow the exercise prescription taught. Start at a low level workload and increase workload after able to maintain previous level for 30 minutes. Increase time before increasing intensity.   Intervention Provide exercise education and an individualized exercise  prescription that will provide continued progressive overload as per policy without signs/symptoms of physical distress.   Diabetes Yes   Goal Blood glucose control identified by blood glucose values, HgbA1C. Participant verbalizes understanding of the signs/symptoms of hyper/hypo glycemia, proper foot care and importance of medication and nutrition plan for blood glucose control.   Intervention Provide nutrition & aerobic exercise along with prescribed medications to achieve blood glucose in normal ranges: Fasting 65-99 mg/dL   Hypertension Yes   Goal Participant will see blood pressure controlled within the values of 140/5590mm/Hg or within value directed by their physician.   Intervention Provide nutrition & aerobic exercise along with prescribed medications to achieve BP 140/90 or less.   Lipids Yes   Goal Cholesterol controlled with medications as prescribed, with individualized exercise RX and with personalized nutrition plan. Value goals: LDL < 70mg , HDL > 40mg . Participant states understanding of desired cholesterol values and following prescriptions.   Intervention Provide nutrition & aerobic exercise along with prescribed medications to achieve LDL 70mg , HDL >40mg .   Stress No      Personal Goals and Risk Factors Review:      Goals and Risk Factor Review      10/11/14 1555           Weight Management   Goals Progress/Improvement seen Yes       Comments Weight down a few pounds       Increase Aerobic Exercise and Physical Activity   Goals Progress/Improvement seen  Yes  Comments Doing well with exercise prescription       Diabetes   Goal --  Good blood sugar contol noted during program session       Hypertension   Goal --  Blood pressure in controlled range       Abnormal Lipids   Goal --  Following exercise guidelines. No labs to see any changes          Personal Goals Discharge:     Comments:30 day review

## 2014-10-12 DIAGNOSIS — Z952 Presence of prosthetic heart valve: Secondary | ICD-10-CM

## 2014-10-12 DIAGNOSIS — Z954 Presence of other heart-valve replacement: Secondary | ICD-10-CM | POA: Diagnosis not present

## 2014-10-12 NOTE — Progress Notes (Signed)
Daily Session Note  Patient Details  Name: Zoe [name] MRN: [id number] Date of Birth: 1927/02/04 Referring Provider:  Teodoro Spray, MD  Encounter Date: 10/12/2014  Check In:     Session Check In - 10/12/14 0841    Check-In   Staff Present Heath Lark RN, BSN, CCRP;Renee Dillard Essex MS, ACSM CEP Exercise Physiologist;Alnita Aybar BS, ACSM EP-C, Exercise Physiologist   ER physicians immediately available to respond to emergencies See telemetry face sheet for immediately available ER MD   Medication changes reported     No   Fall or balance concerns reported    No   Warm-up and Cool-down Performed on first and last piece of equipment   VAD Patient? No   Pain Assessment   Currently in Pain? No/denies         Goals Met:  Proper associated with RPD/PD & O2 Sat Exercise tolerated well No report of cardiac concerns or symptoms Strength training completed today  Goals Unmet:  Not Applicable  Goals Comments:    Dr. Emily Filbert is Medical Director for [place] and [facility].

## 2014-10-13 ENCOUNTER — Other Ambulatory Visit: Payer: Self-pay | Admitting: *Deleted

## 2014-10-13 DIAGNOSIS — Z952 Presence of prosthetic heart valve: Secondary | ICD-10-CM

## 2014-10-14 ENCOUNTER — Encounter: Payer: Medicare Other | Admitting: *Deleted

## 2014-10-14 DIAGNOSIS — Z954 Presence of other heart-valve replacement: Secondary | ICD-10-CM | POA: Diagnosis not present

## 2014-10-14 DIAGNOSIS — Z952 Presence of prosthetic heart valve: Secondary | ICD-10-CM

## 2014-10-14 NOTE — Progress Notes (Signed)
Daily Session Note  Patient Details  Name: MYRL BYNUM MRN: [id number] Date of Birth: September 16, 1926 Referring Provider:  Teodoro Spray, MD  Encounter Date: 10/14/2014  Check In:     Session Check In - 10/14/14 0918    Check-In   Staff Present Heath Lark RN, BSN, CCRP;Renee Dillard Essex MS, ACSM CEP Exercise Physiologist;Carroll Enterkin RN, BSN   ER physicians immediately available to respond to emergencies See telemetry face sheet for immediately available ER MD   Medication changes reported     No   Fall or balance concerns reported    No   Warm-up and Cool-down Performed on first and last piece of equipment   VAD Patient? No   Pain Assessment   Currently in Pain? No/denies         Goals Met:  Independence with exercise equipment Exercise tolerated well No report of cardiac concerns or symptoms Strength training completed today  Goals Unmet:  Not Applicable  Goals Comments:    Dr. Emily Filbert is Medical Director for [place] and [facility].

## 2014-10-19 ENCOUNTER — Encounter: Payer: Medicare Other | Attending: Cardiology

## 2014-10-19 DIAGNOSIS — Z952 Presence of prosthetic heart valve: Secondary | ICD-10-CM

## 2014-10-19 DIAGNOSIS — Z954 Presence of other heart-valve replacement: Secondary | ICD-10-CM | POA: Insufficient documentation

## 2014-10-19 NOTE — Progress Notes (Signed)
Daily Session Note  Patient Details  Name: YVONDA FOUTY MRN: [id number] Date of Birth: 08/25/1926 Referring Provider:  Teodoro Spray, MD  Encounter Date: 10/19/2014  Check In:     Session Check In - 10/19/14 0841    Check-In   Staff Present Heath Lark RN, BSN, CCRP;Renee Dillard Essex MS, ACSM CEP Exercise Physiologist;Tawan Degroote BS, ACSM EP-C, Exercise Physiologist   ER physicians immediately available to respond to emergencies See telemetry face sheet for immediately available ER MD   Medication changes reported     No   Fall or balance concerns reported    No   Warm-up and Cool-down Performed on first and last piece of equipment   VAD Patient? No   Pain Assessment   Currently in Pain? No/denies         Goals Met:  Proper associated with RPD/PD & O2 Sat Exercise tolerated well No report of cardiac concerns or symptoms Strength training completed today  Goals Unmet:  Not Applicable  Goals Comments:    Dr. Emily Filbert is Medical Director for [place] and [facility].

## 2014-10-21 ENCOUNTER — Encounter: Payer: Medicare Other | Admitting: *Deleted

## 2014-10-21 DIAGNOSIS — Z952 Presence of prosthetic heart valve: Secondary | ICD-10-CM

## 2014-10-21 DIAGNOSIS — Z954 Presence of other heart-valve replacement: Secondary | ICD-10-CM | POA: Diagnosis not present

## 2014-10-21 NOTE — Progress Notes (Signed)
Daily Session Note  Patient Details  Name: Zoe [name] MRN: [id number] Date of Birth: 01/21/1927 Referring Provider:  Teodoro Spray, MD  Encounter Date: 10/21/2014  Check In:     Session Check In - 10/21/14 0947    Check-In   Staff Present Heath Lark RN, BSN, CCRP;Carroll Enterkin RN, Drusilla Kanner MS, ACSM CEP Exercise Physiologist   ER physicians immediately available to respond to emergencies See telemetry face sheet for immediately available ER MD   Medication changes reported     No   Fall or balance concerns reported    No   Warm-up and Cool-down Performed on first and last piece of equipment   VAD Patient? No   Pain Assessment   Currently in Pain? No/denies   Multiple Pain Sites No         Goals Met:  Independence with exercise equipment Exercise tolerated well No report of cardiac concerns or symptoms Strength training completed today  Goals Unmet:  Not Applicable  Goals Comments:    Dr. Emily Filbert is Medical Director for [place] and [facility].

## 2014-10-24 ENCOUNTER — Encounter: Payer: Medicare Other | Admitting: *Deleted

## 2014-10-24 DIAGNOSIS — Z952 Presence of prosthetic heart valve: Secondary | ICD-10-CM

## 2014-10-24 DIAGNOSIS — Z954 Presence of other heart-valve replacement: Secondary | ICD-10-CM | POA: Diagnosis not present

## 2014-10-24 NOTE — Progress Notes (Signed)
Daily Session Note  Patient Details  Name: Zoe [name] MRN: [id number] Date of Birth: [birth year] Referring Provider:  Teodoro Spray, MD  Encounter Date: 10/24/2014  Check In:     Session Check In - 10/24/14 0853    Check-In   Staff Present Heath Lark RN, BSN, CCRP;Renee Dillard Essex MS, ACSM CEP Exercise Physiologist;Gisselle Galvis Alfonso Patten, ACSM CEP Exercise Physiologist   ER physicians immediately available to respond to emergencies See telemetry face sheet for immediately available ER MD   Medication changes reported     No   Fall or balance concerns reported    No   Warm-up and Cool-down Performed on first and last piece of equipment   VAD Patient? No   Pain Assessment   Currently in Pain? No/denies   Multiple Pain Sites No         Goals Met:  Independence with exercise equipment Exercise tolerated well No report of cardiac concerns or symptoms  Goals Unmet:  Not Applicable  Goals Comments:    Dr. Emily Filbert is Medical Director for [place] and [facility].

## 2014-10-26 DIAGNOSIS — Z952 Presence of prosthetic heart valve: Secondary | ICD-10-CM

## 2014-10-26 DIAGNOSIS — Z954 Presence of other heart-valve replacement: Secondary | ICD-10-CM | POA: Diagnosis not present

## 2014-10-26 NOTE — Progress Notes (Signed)
Daily Session Note  Patient Details  Name: BETTYANN BIRCHLER MRN: [id number] Date of Birth: 10-01-1926 Referring Provider:  Teodoro Spray, MD  Encounter Date: 10/26/2014  Check In:     Session Check In - 10/26/14 0909    Check-In   Staff Present Heath Lark RN, BSN, CCRP;Marnette Perkins BS, ACSM EP-C, Exercise Physiologist;Renee Dillard Essex MS, ACSM CEP Exercise Physiologist   ER physicians immediately available to respond to emergencies See telemetry face sheet for immediately available ER MD   Medication changes reported     No   Fall or balance concerns reported    No   Warm-up and Cool-down Performed on first and last piece of equipment   VAD Patient? No   Pain Assessment   Currently in Pain? No/denies         Goals Met:  Proper associated with RPD/PD & O2 Sat Exercise tolerated well No report of cardiac concerns or symptoms Strength training completed today  Goals Unmet:  Not Applicable  Goals Comments:   Dr. Emily Filbert is Medical Director for [place] and [facility].

## 2014-10-28 ENCOUNTER — Encounter: Payer: Medicare Other | Admitting: *Deleted

## 2014-10-28 DIAGNOSIS — Z954 Presence of other heart-valve replacement: Secondary | ICD-10-CM | POA: Diagnosis not present

## 2014-10-28 DIAGNOSIS — Z952 Presence of prosthetic heart valve: Secondary | ICD-10-CM

## 2014-10-28 NOTE — Progress Notes (Signed)
Daily Session Note  Patient Details  Name: Zoe [name] MRN: [id number] Date of Birth: 10/01/1926 Referring Provider:  Teodoro Spray, MD  Encounter Date: 10/28/2014  Check In:     Session Check In - 10/28/14 0954    Check-In   Staff Present Gerlene Burdock RN, BSN;Lanya Bucks Dillard Essex MS, ACSM CEP Exercise Physiologist;Mary Kellie Shropshire RN   ER physicians immediately available to respond to emergencies See telemetry face sheet for immediately available ER MD   Medication changes reported     No   Fall or balance concerns reported    No   Warm-up and Cool-down Performed on first and last piece of equipment   VAD Patient? No   Pain Assessment   Currently in Pain? No/denies   Multiple Pain Sites No         Goals Met:  Proper associated with RPD/PD & O2 Sat Exercise tolerated well No report of cardiac concerns or symptoms Strength training completed today  Goals Unmet:  Not Applicable  Goals Comments:   Dr. Emily Filbert is Medical Director for [place] and [facility].

## 2014-10-31 ENCOUNTER — Encounter: Payer: Medicare Other | Admitting: *Deleted

## 2014-10-31 DIAGNOSIS — Z952 Presence of prosthetic heart valve: Secondary | ICD-10-CM

## 2014-10-31 DIAGNOSIS — Z954 Presence of other heart-valve replacement: Secondary | ICD-10-CM | POA: Diagnosis not present

## 2014-10-31 NOTE — Progress Notes (Signed)
Daily Session Note  Patient Details  Name: DAISIE HAFT MRN: [id number] Date of Birth: Jan 21, 1927 Referring Provider:  Teodoro Spray, MD  Encounter Date: 10/31/2014  Check In:     Session Check In - 10/31/14 0849    Check-In   Staff Present Candiss Norse MS, ACSM CEP Exercise Physiologist;Susanne Bice RN, BSN, CCRP;Mailin Coglianese Alfonso Patten, ACSM CEP Exercise Physiologist   ER physicians immediately available to respond to emergencies See telemetry face sheet for immediately available ER MD   Medication changes reported     No   Fall or balance concerns reported    No   Warm-up and Cool-down Performed on first and last piece of equipment   VAD Patient? No   Pain Assessment   Currently in Pain? No/denies   Multiple Pain Sites No         Goals Met:  Independence with exercise equipment Exercise tolerated well No report of cardiac concerns or symptoms  Goals Unmet:  Not Applicable  Goals Comments:    Dr. Emily Filbert is Medical Director for [place] and [facility].

## 2014-11-02 ENCOUNTER — Encounter: Payer: Self-pay | Admitting: *Deleted

## 2014-11-02 DIAGNOSIS — Z952 Presence of prosthetic heart valve: Secondary | ICD-10-CM

## 2014-11-02 DIAGNOSIS — Z954 Presence of other heart-valve replacement: Secondary | ICD-10-CM | POA: Diagnosis not present

## 2014-11-02 NOTE — Progress Notes (Signed)
Daily Session Note  Patient Details  Name: Zoe [name] MRN: [id number] Date of Birth: [birth year] Referring Provider:  Teodoro Spray, MD  Encounter Date: 11/02/2014  Check In:     Session Check In - 11/02/14 0837    Check-In   Staff Present Heath Lark RN, BSN, CCRP;Deontae Robson BS, ACSM EP-C, Exercise Physiologist;Renee Dillard Essex MS, ACSM CEP Exercise Physiologist   ER physicians immediately available to respond to emergencies See telemetry face sheet for immediately available ER MD   Medication changes reported     No   Fall or balance concerns reported    No   Warm-up and Cool-down Performed on first and last piece of equipment   VAD Patient? No   Pain Assessment   Currently in Pain? No/denies         Goals Met:  Proper associated with RPD/PD & O2 Sat Exercise tolerated well No report of cardiac concerns or symptoms Strength training completed today  Goals Unmet:  Not Applicable  Goals Comments:    Dr. Emily Filbert is Medical Director for [place] and [facility].

## 2014-11-03 ENCOUNTER — Encounter: Payer: Self-pay | Admitting: *Deleted

## 2014-11-03 NOTE — Patient Instructions (Signed)
Discharge Instructions  Patient Details  Name: Zoe [name] MRN: [id number] Date of Birth: 08-24-1926 Referring Provider:  No ref. provider found   Number of Visits: 36  Reason for Discharge:  Patient reached a stable level of exercise. Patient independent in their exercise.  Smoking History:  History  Smoking status  . Former Smoker -- 0.25 packs/day for 5 years  . Types: Cigarettes  . Quit date: 05/22/1968  Smokeless tobacco  . Not on file    Diagnosis:  No diagnosis found.  Initial Exercise Prescription:     Initial Exercise Prescription - 09/20/14 1000    Treadmill   MPH 1.5   Grade 0   Minutes 10   Bike   Level 0.4   Minutes 10   Recumbant Bike   Level 2   Watts 15   Minutes 10   NuStep   Level 2   Watts 35   Minutes 15   Arm Ergometer   Level 1   Watts 8   Minutes 10   Arm/Foot Ergometer   Level 2   Watts 10   Minutes 10   Cybex   Level 1   RPM 50   Minutes 10   Recumbant Elliptical   Level 1   RPM 40   Watts 10   Minutes 10   REL-XR   Level 1   Watts 20   Minutes 10   Prescription Details   Frequency (times per week) 3   Duration Progress to 30 minutes of continuous aerobic without signs/symptoms of physical distress   Intensity   THRR REST +  20   Ratings of Perceived Exertion 11-15   Progression Continue progressive overload as per policy without signs/symptoms or physical distress.   Resistance Training   Training Prescription Yes   Weight 1      Discharge Exercise Prescription:   Functional Capacity:     6 Minute Walk      09/20/14 1002       6 Minute Walk   Phase Initial     Distance 1070 feet     Walk Time 6 minutes     Resting HR 84 bpm     Resting BP 162/88 mmHg     Max Ex. HR 111 bpm     Max Ex. BP 162/90 mmHg     RPE 11     Symptoms No        Quality of Life:     Quality of Life - 11/02/14 1128    Quality of Life Scores   Health/Function Pre 21.63 %   Health/Function Post 23.11 %   Health/Function % Change 7 %   Socioeconomic Pre 22.08 %   Socioeconomic Post 25.25 %   Socioeconomic % Change 14 %   Psych/Spiritual Pre 25.2 %   Psych/Spiritual Post 25.75 %   Psych/Spiritual % Change 2 %   Family Pre 28.5 %   Family Post 26.38 %   Family % Change -7 %   GLOBAL Pre 23.23 %   GLOBAL Post 26.38 %   GLOBAL % Change 5 %      Personal Goals: Goals established at orientation with interventions provided to work toward goal.     Personal Goals and Risk Factors at Admission - 09/20/14 0908    Personal Goals and Risk Factors on Admission    Weight Management Yes   Intervention Learn and follow the exercise and diet guidelines while in the program. Utilize the  nutrition and education classes to help gain knowledge of the diet and exercise expectations in the program   Increase Aerobic Exercise and Physical Activity Yes;Sedentary   Intervention While in program, learn and follow the exercise prescription taught. Start at a low level workload and increase workload after able to maintain previous level for 30 minutes. Increase time before increasing intensity.   Intervention Provide exercise education and an individualized exercise prescription that will provide continued progressive overload as per policy without signs/symptoms of physical distress.   Diabetes Yes   Goal Blood glucose control identified by blood glucose values, HgbA1C. Participant verbalizes understanding of the signs/symptoms of hyper/hypo glycemia, proper foot care and importance of medication and nutrition plan for blood glucose control.   Intervention Provide nutrition & aerobic exercise along with prescribed medications to achieve blood glucose in normal ranges: Fasting 65-99 mg/dL   Hypertension Yes   Goal Participant will see blood pressure controlled within the values of 140/48mm/Hg or within value directed by their physician.   Intervention Provide nutrition & aerobic exercise along with prescribed  medications to achieve BP 140/90 or less.   Lipids Yes   Goal Cholesterol controlled with medications as prescribed, with individualized exercise RX and with personalized nutrition plan. Value goals: LDL < 70mg , HDL > 40mg . Participant states understanding of desired cholesterol values and following prescriptions.   Intervention Provide nutrition & aerobic exercise along with prescribed medications to achieve LDL 70mg , HDL >40mg .   Stress No       Personal Goals Discharge:   Nutrition & Weight - Outcomes:     Pre Biometrics - 09/20/14 1010    Pre Biometrics   Height 5\' 8"  (1.727 m)   Weight 197 lb 3.2 oz (89.449 kg)   Waist Circumference 33 inches   Hip Circumference 44.5 inches   Waist to Hip Ratio 0.74 %   BMI (Calculated) 30       Nutrition:   Nutrition Discharge:     Rate Your Plate - 08/65/78 1127    Rate Your Plate Scores   Post Score 61   Post Score % 70 %      Education Questionnaire Score:     Knowledge Questionnaire Score - 11/02/14 1124    Knowledge Questionnaire Score   Pre Score 20/28   Post Score 21/28      Goals reviewed with patient; copy given to patient.

## 2014-11-04 ENCOUNTER — Encounter: Payer: Medicare Other | Admitting: *Deleted

## 2014-11-04 DIAGNOSIS — Z954 Presence of other heart-valve replacement: Secondary | ICD-10-CM | POA: Diagnosis not present

## 2014-11-04 DIAGNOSIS — Z952 Presence of prosthetic heart valve: Secondary | ICD-10-CM

## 2014-11-04 NOTE — Progress Notes (Signed)
Daily Session Note  Patient Details  Name: [name] MRN: [id number] Date of Birth: September 15, 1926 Referring Provider:  Teodoro Spray, MD  Encounter Date: 11/04/2014  Check In:     Session Check In - 11/04/14 0932    Check-In   Staff Present Candiss Norse MS, ACSM CEP Exercise Physiologist;Carroll Enterkin RN, BSN;Susanne Bice RN, BSN, [place]   ER physicians immediately available to respond to emergencies See telemetry face sheet for immediately available ER MD   Medication changes reported     No   Fall or balance concerns reported    No   Warm-up and Cool-down Performed on first and last piece of equipment   VAD Patient? No   Pain Assessment   Currently in Pain? No/denies   Multiple Pain Sites No         Goals Met:  Independence with exercise equipment Exercise tolerated well No report of cardiac concerns or symptoms Strength training completed today  Goals Unmet:  Not Applicable  Goals Comments:   Dr. Emily Filbert is Medical Director for [place] and [facility].

## 2014-11-07 ENCOUNTER — Encounter: Payer: Medicare Other | Admitting: *Deleted

## 2014-11-07 DIAGNOSIS — Z952 Presence of prosthetic heart valve: Secondary | ICD-10-CM

## 2014-11-07 DIAGNOSIS — Z954 Presence of other heart-valve replacement: Secondary | ICD-10-CM | POA: Diagnosis not present

## 2014-11-07 NOTE — Progress Notes (Signed)
Daily Session Note  Patient Details  Name: SHALAINA GUARDIOLA MRN: [id number] Date of Birth: 12-22-1926 Referring Provider:  Teodoro Spray, MD  Encounter Date: 11/07/2014  Check In:     Session Check In - 11/07/14 0835    Check-In   Staff Present Heath Lark RN, BSN, CCRP;Shaunte Tuft Alfonso Patten, ACSM CEP Exercise Physiologist;Carroll Enterkin RN, BSN   ER physicians immediately available to respond to emergencies See telemetry face sheet for immediately available ER MD   Medication changes reported     No   Fall or balance concerns reported    No   Warm-up and Cool-down Performed on first and last piece of equipment   VAD Patient? No   Pain Assessment   Currently in Pain? No/denies   Multiple Pain Sites No         Goals Met:  Independence with exercise equipment Exercise tolerated well No report of cardiac concerns or symptoms Strength training completed today  Goals Unmet:  Not Applicable  Goals Comments:    Dr. Emily Filbert is Medical Director for [place] and [facility].

## 2014-11-09 ENCOUNTER — Other Ambulatory Visit: Payer: Medicare Other | Admitting: *Deleted

## 2014-11-09 DIAGNOSIS — Z954 Presence of other heart-valve replacement: Secondary | ICD-10-CM | POA: Diagnosis not present

## 2014-11-09 DIAGNOSIS — Z952 Presence of prosthetic heart valve: Secondary | ICD-10-CM

## 2014-11-09 NOTE — Progress Notes (Signed)
Daily Session Note  Patient Details  Name: AUDEN WETTSTEIN MRN: [id number] Date of Birth: 1927/01/26 Referring Provider:  Teodoro Spray, MD  Encounter Date: 11/09/2014  Check In:     Session Check In - 11/09/14 0904    Check-In   Staff Present Heath Lark RN, BSN, CCRP;Chalese Peach BS, ACSM EP-C, Exercise Physiologist;Renee Dillard Essex MS, ACSM CEP Exercise Physiologist   ER physicians immediately available to respond to emergencies See telemetry face sheet for immediately available ER MD   Medication changes reported     No   Fall or balance concerns reported    No   Warm-up and Cool-down Performed on first and last piece of equipment   VAD Patient? No   Pain Assessment   Currently in Pain? No/denies         Goals Met:  Proper associated with RPD/PD & O2 Sat Exercise tolerated well No report of cardiac concerns or symptoms Strength training completed today  Goals Unmet:  Not Applicable  Goals Comments:    Dr. Emily Filbert is Medical Director for [place] and [facility].

## 2014-11-11 ENCOUNTER — Encounter: Payer: Medicare Other | Admitting: *Deleted

## 2014-11-11 VITALS — Wt 197.0 lb

## 2014-11-11 DIAGNOSIS — Z954 Presence of other heart-valve replacement: Secondary | ICD-10-CM | POA: Diagnosis not present

## 2014-11-11 DIAGNOSIS — Z952 Presence of prosthetic heart valve: Secondary | ICD-10-CM

## 2014-11-11 NOTE — Progress Notes (Signed)
Daily Session Note  Patient Details  Name: MAZZY SANTARELLI MRN: [id number] Date of Birth: 1927/04/01 Referring Provider:  Teodoro Spray, MD  Encounter Date: 11/11/2014  Check In:      Goals Met:  Independence with exercise equipment Exercise tolerated well No report of cardiac concerns or symptoms Strength training completed today  Goals Unmet:  Not Applicable  Goals Comments:    Dr. Emily Filbert is Medical Director for [place] and [facility].

## 2014-11-14 ENCOUNTER — Encounter: Payer: Medicare Other | Admitting: *Deleted

## 2014-11-14 DIAGNOSIS — Z952 Presence of prosthetic heart valve: Secondary | ICD-10-CM

## 2014-11-14 DIAGNOSIS — Z954 Presence of other heart-valve replacement: Secondary | ICD-10-CM | POA: Diagnosis not present

## 2014-11-14 NOTE — Progress Notes (Signed)
Daily Session Note  Patient Details  Name: TYNLEIGH BIRT MRN: [id number] Date of Birth: Oct 14, 1926 Referring Provider:  Teodoro Spray, MD  Encounter Date: 11/14/2014  Check In:     Session Check In - 11/14/14 0838    Check-In   Staff Present Heath Lark RN, BSN, CCRP;Kelly Hayes BS, ACSM CEP Exercise Physiologist;Kairie Vangieson Dillard Essex MS, ACSM CEP Exercise Physiologist   ER physicians immediately available to respond to emergencies See telemetry face sheet for immediately available ER MD   Medication changes reported     No   Fall or balance concerns reported    No   Warm-up and Cool-down Performed on first and last piece of equipment   VAD Patient? No   Pain Assessment   Currently in Pain? No/denies   Multiple Pain Sites No         Goals Met:  Independence with exercise equipment Exercise tolerated well No report of cardiac concerns or symptoms Strength training completed today  Goals Unmet:  Not Applicable  Goals Comments:   Dr. Emily Filbert is Medical Director for [place] and [facility].

## 2014-11-16 ENCOUNTER — Encounter: Payer: Self-pay | Admitting: *Deleted

## 2014-11-16 DIAGNOSIS — Z954 Presence of other heart-valve replacement: Secondary | ICD-10-CM | POA: Diagnosis not present

## 2014-11-16 DIAGNOSIS — Z952 Presence of prosthetic heart valve: Secondary | ICD-10-CM

## 2014-11-16 NOTE — Progress Notes (Signed)
Daily Session Note  Patient Details  Name: Zoe [name] MRN: [id number] Date of Birth: 04/02/1927 Referring Provider:  Fath, Kenneth A, MD  Encounter Date: 11/16/2014  Check In:     Session Check In - 11/16/14 0839    Check-In   Staff Present Susanne Bice RN, BSN, CCRP;  BS, ACSM EP-C, Exercise Physiologist;Renee MacMillan MS, ACSM CEP Exercise Physiologist   ER physicians immediately available to respond to emergencies See telemetry face sheet for immediately available ER MD   Medication changes reported     No   Fall or balance concerns reported    No   Warm-up and Cool-down Performed on first and last piece of equipment   VAD Patient? No   Pain Assessment   Currently in Pain? No/denies         Goals Met:  Proper associated with RPD/PD & O2 Sat Exercise tolerated well No report of cardiac concerns or symptoms Strength training completed today  Goals Unmet:  Not Applicable  Goals Comments:    Dr. Mark Miller is Medical Director for [facility] and [facility]. 

## 2014-11-16 NOTE — Patient Instructions (Signed)
Discharge Instructions  Patient Details  Name: Zoe [name] MRN: [id number] Date of Birth: [birth year] Referring Provider:  Dr. Oletta CohnK Fath  Number of Visits: 36  Reason for Discharge:  Patient reached a stable level of exercise. Patient independent in their exercise.  Smoking History:  History  Smoking status  . Former Smoker -- 0.25 packs/day for 5 years  . Types: Cigarettes  . Quit date: 05/22/1968  Smokeless tobacco  . Not on file    Diagnosis:  S/P aortic valve replacement  Initial Exercise Prescription:     Initial Exercise Prescription - 09/20/14 1000    Treadmill   MPH 1.5   Grade 0   Minutes 10   Bike   Level 0.4   Minutes 10   Recumbant Bike   Level 2   Watts 15   Minutes 10   NuStep   Level 2   Watts 35   Minutes 15   Arm Ergometer   Level 1   Watts 8   Minutes 10   Arm/Foot Ergometer   Level 2   Watts 10   Minutes 10   Cybex   Level 1   RPM 50   Minutes 10   Recumbant Elliptical   Level 1   RPM 40   Watts 10   Minutes 10   REL-XR   Level 1   Watts 20   Minutes 10   Prescription Details   Frequency (times per week) 3   Duration Progress to 30 minutes of continuous aerobic without signs/symptoms of physical distress   Intensity   THRR REST +  20   Ratings of Perceived Exertion 11-15   Progression Continue progressive overload as per policy without signs/symptoms or physical distress.   Resistance Training   Training Prescription Yes   Weight 1      Discharge Exercise Prescription (Final Exercise Prescription Changes):     Exercise Prescription Changes - 11/11/14 1000    Exercise Review   Progression Yes   Response to Exercise   Blood Pressure (Admit) 136/82 mmHg   Blood Pressure (Exercise) 144/82 mmHg   Blood Pressure (Exit) 120/70 mmHg   Heart Rate (Admit) 82 bpm   Heart Rate (Exercise) 97 bpm   Heart Rate (Exit) 82 bpm   Rating of Perceived Exertion (Exercise) 15   Comments Plans after program: Walk at the park and  has a stationary bike at her house   Frequency Add 1 additional day to program exercise sessions.   Duration Progress to 30 minutes of continuous aerobic without signs/symptoms of physical distress   Intensity THRR unchanged   Progression Continue progressive overload as per policy without signs/symptoms or physical distress.   Resistance Training   Training Prescription Yes   Weight 2   Reps 10-12   Treadmill   MPH 1.6   Grade 0   Minutes 15   NuStep   Level 4   Watts 50   Minutes 25      Functional Capacity:     6 Minute Walk      09/20/14 1002 11/11/14 1014     6 Minute Walk   Phase Initial Discharge    Distance 1070 feet 1180 feet    Walk Time 6 minutes 6 minutes    Resting HR 84 bpm 98 bpm    Resting BP 162/88 mmHg 130/80 mmHg    Max Ex. HR 111 bpm 104 bpm    Max Ex. BP 162/90 mmHg 148/70 mmHg  RPE 11 12    Symptoms No No       Quality of Life:     Quality of Life - 11/02/14 1128    Quality of Life Scores   Health/Function Pre 21.63 %   Health/Function Post 23.11 %   Health/Function % Change 7 %   Socioeconomic Pre 22.08 %   Socioeconomic Post 25.25 %   Socioeconomic % Change 14 %   Psych/Spiritual Pre 25.2 %   Psych/Spiritual Post 25.75 %   Psych/Spiritual % Change 2 %   Family Pre 28.5 %   Family Post 26.38 %   Family % Change -7 %   GLOBAL Pre 23.23 %   GLOBAL Post 26.38 %   GLOBAL % Change 5 %      Personal Goals: Goals established at orientation with interventions provided to work toward goal.     Personal Goals and Risk Factors at Admission - 09/20/14 0908    Personal Goals and Risk Factors on Admission    Weight Management Yes   Intervention Learn and follow the exercise and diet guidelines while in the program. Utilize the nutrition and education classes to help gain knowledge of the diet and exercise expectations in the program   Increase Aerobic Exercise and Physical Activity Yes;Sedentary   Intervention While in program, learn  and follow the exercise prescription taught. Start at a low level workload and increase workload after able to maintain previous level for 30 minutes. Increase time before increasing intensity.   Intervention Provide exercise education and an individualized exercise prescription that will provide continued progressive overload as per policy without signs/symptoms of physical distress.   Diabetes Yes   Goal Blood glucose control identified by blood glucose values, HgbA1C. Participant verbalizes understanding of the signs/symptoms of hyper/hypo glycemia, proper foot care and importance of medication and nutrition plan for blood glucose control.   Intervention Provide nutrition & aerobic exercise along with prescribed medications to achieve blood glucose in normal ranges: Fasting 65-99 mg/dL   Hypertension Yes   Goal Participant will see blood pressure controlled within the values of 140/5mm/Hg or within value directed by their physician.   Intervention Provide nutrition & aerobic exercise along with prescribed medications to achieve BP 140/90 or less.   Lipids Yes   Goal Cholesterol controlled with medications as prescribed, with individualized exercise RX and with personalized nutrition plan. Value goals: LDL < 70mg , HDL > 40mg . Participant states understanding of desired cholesterol values and following prescriptions.   Intervention Provide nutrition & aerobic exercise along with prescribed medications to achieve LDL 70mg , HDL >40mg .   Stress No       Personal Goals Discharge:   Nutrition & Weight - Outcomes:     Pre Biometrics - 09/20/14 1010    Pre Biometrics   Height 5\' 8"  (1.727 m)   Weight 197 lb 3.2 oz (89.449 kg)   Waist Circumference 33 inches   Hip Circumference 44.5 inches   Waist to Hip Ratio 0.74 %   BMI (Calculated) 30         Post Biometrics - 11/11/14 1014     Post  Biometrics   Weight 197 lb (89.359 kg)   Waist Circumference 33.5 inches   Hip Circumference 42  inches   Waist to Hip Ratio 0.8 %      Nutrition:   Nutrition Discharge:     Rate Your Plate - 16/10/96 1127    Rate Your Plate Scores   Post Score 61  Post Score % 70 %      Education Questionnaire Score:     Knowledge Questionnaire Score - 11/16/14 0704    Knowledge Questionnaire Score   Pre Score 20/28   Post Score 21/28      Goals reviewed with patient; copy given to patient.

## 2014-11-16 NOTE — Progress Notes (Signed)
Discharge Summary  Patient Details  Name: Zoe [name] MRN: [id number] Date of Birth: 07-15-1926 Referring Provider: Dr. Kirtland Bouchard. Fath  Number of Visits: 17  Reason for Discharge:  Patient reached a stable level of exercise. Patient independent in their exercise.  Smoking History:  History  Smoking status  . Former Smoker -- 0.25 packs/day for 5 years  . Types: Cigarettes  . Quit date: 05/22/1968  Smokeless tobacco  . Not on file    Diagnosis:  S/P aortic valve replacement  ADL [facility]:   Initial Exercise Prescription:     Initial Exercise Prescription - 09/20/14 1000    Treadmill   MPH 1.5   Grade 0   Minutes 10   Bike   Level 0.4   Minutes 10   Recumbant Bike   Level 2   Watts 15   Minutes 10   NuStep   Level 2   Watts 35   Minutes 15   Arm Ergometer   Level 1   Watts 8   Minutes 10   Arm/Foot Ergometer   Level 2   Watts 10   Minutes 10   Cybex   Level 1   RPM 50   Minutes 10   Recumbant Elliptical   Level 1   RPM 40   Watts 10   Minutes 10   REL-XR   Level 1   Watts 20   Minutes 10   Prescription Details   Frequency (times per week) 3   Duration Progress to 30 minutes of continuous aerobic without signs/symptoms of physical distress   Intensity   THRR REST +  20   Ratings of Perceived Exertion 11-15   Progression Continue progressive overload as per policy without signs/symptoms or physical distress.   Resistance Training   Training Prescription Yes   Weight 1      Discharge Exercise Prescription (Final Exercise Prescription Changes):     Exercise Prescription Changes - 11/11/14 1000    Exercise Review   Progression Yes   Response to Exercise   Blood Pressure (Admit) 136/82 mmHg   Blood Pressure (Exercise) 144/82 mmHg   Blood Pressure (Exit) 120/70 mmHg   Heart Rate (Admit) 82 bpm   Heart Rate (Exercise) 97 bpm   Heart Rate (Exit) 82 bpm   Rating of Perceived Exertion (Exercise) 15   Comments Plans after program: Walk at  the park and has a stationary bike at her house   Frequency Add 1 additional day to program exercise sessions.   Duration Progress to 30 minutes of continuous aerobic without signs/symptoms of physical distress   Intensity THRR unchanged   Progression Continue progressive overload as per policy without signs/symptoms or physical distress.   Resistance Training   Training Prescription Yes   Weight 2   Reps 10-12   Treadmill   MPH 1.6   Grade 0   Minutes 15   NuStep   Level 4   Watts 50   Minutes 25      Functional Capacity:     6 Minute Walk      09/20/14 1002 11/11/14 1014     6 Minute Walk   Phase Initial Discharge    Distance 1070 feet 1180 feet    Walk Time 6 minutes 6 minutes    Resting HR 84 bpm 98 bpm    Resting BP 162/88 mmHg 130/80 mmHg    Max Ex. HR 111 bpm 104 bpm    Max Ex. BP 162/90 mmHg  148/70 mmHg    RPE 11 12    Symptoms No No       Psychological, QOL, Others - Outcomes: PHQ 2/9: [facility] 11/02/2014 09/20/2014  Decreased Interest 0 2  Down, Depressed, Hopeless 0 0  PHQ - 2 Score 0 2  Altered sleeping 0 0  Tired, decreased energy 0 0  Change in appetite 0 0  Feeling bad or failure about yourself  0 0  Trouble concentrating 0 0  Moving slowly or fidgety/restless 0 0  Suicidal thoughts 0 0  PHQ-9 Score 0 2  Difficult doing work/chores Not difficult at all Not difficult at all    Quality of Life:     Quality of Life - 11/02/14 1128    Quality of Life Scores   Health/Function Pre 21.63 %   Health/Function Post 23.11 %   Health/Function % Change 7 %   Socioeconomic Pre 22.08 %   Socioeconomic Post 25.25 %   Socioeconomic % Change 14 %   Psych/Spiritual Pre 25.2 %   Psych/Spiritual Post 25.75 %   Psych/Spiritual % Change 2 %   Family Pre 28.5 %   Family Post 26.38 %   Family % Change -7 %   GLOBAL Pre 23.23 %   GLOBAL Post 26.38 %   GLOBAL % Change 5 %      Personal Goals: Goals established at orientation with  interventions provided to work toward goal.     Personal Goals and Risk Factors at Admission - 09/20/14 0908    Personal Goals and Risk Factors on Admission    Weight Management Yes   Intervention Learn and follow the exercise and diet guidelines while in the program. Utilize the nutrition and education classes to help gain knowledge of the diet and exercise expectations in the program   Increase Aerobic Exercise and Physical Activity Yes;Sedentary   Intervention While in program, learn and follow the exercise prescription taught. Start at a low level workload and increase workload after able to maintain previous level for 30 minutes. Increase time before increasing intensity.   Intervention Provide exercise education and an individualized exercise prescription that will provide continued progressive overload as per policy without signs/symptoms of physical distress.   Diabetes Yes   Goal Blood glucose control identified by blood glucose values, HgbA1C. Participant verbalizes understanding of the signs/symptoms of hyper/hypo glycemia, proper foot care and importance of medication and nutrition plan for blood glucose control.   Intervention Provide nutrition & aerobic exercise along with prescribed medications to achieve blood glucose in normal ranges: Fasting 65-99 mg/dL   Hypertension Yes   Goal Participant will see blood pressure controlled within the values of 140/7090mm/Hg or within value directed by their physician.   Intervention Provide nutrition & aerobic exercise along with prescribed medications to achieve BP 140/90 or less.   Lipids Yes   Goal Cholesterol controlled with medications as prescribed, with individualized exercise RX and with personalized nutrition plan. Value goals: LDL < 70mg , HDL > 40mg . Participant states understanding of desired cholesterol values and following prescriptions.   Intervention Provide nutrition & aerobic exercise along with prescribed medications to achieve  LDL 70mg , HDL >40mg .   Stress No       Personal Goals Discharge:   Nutrition & Weight - Outcomes:     Pre Biometrics - 09/20/14 1010    Pre Biometrics   Height 5\' 8"  (1.727 m)   Weight 197 lb 3.2 oz (89.449 kg)   Waist Circumference  33 inches   Hip Circumference 44.5 inches   Waist to Hip Ratio 0.74 %   BMI (Calculated) 30         Post Biometrics - 11/11/14 1014     Post  Biometrics   Weight 197 lb (89.359 kg)   Waist Circumference 33.5 inches   Hip Circumference 42 inches   Waist to Hip Ratio 0.8 %      Nutrition:   Nutrition Discharge:     Rate Your Plate - 69/62/95 1127    Rate Your Plate Scores   Post Score 61   Post Score % 70 %      Education Questionnaire Score:     Knowledge Questionnaire Score - 11/16/14 0704    Knowledge Questionnaire Score   Pre Score 20/28   Post Score 21/28      Goals reviewed with patient; copy given to patient.

## 2014-11-22 ENCOUNTER — Encounter: Payer: Self-pay | Admitting: *Deleted

## 2014-11-22 DIAGNOSIS — Z952 Presence of prosthetic heart valve: Secondary | ICD-10-CM

## 2014-11-22 NOTE — Progress Notes (Signed)
Cardiac Individual Treatment Plan  Patient Details  Name: Zoe [name] MRN: [id number] Date of Birth: February 02, 1927 Referring Provider:  Dr. Christianne Borrow  Initial Encounter Date:  09/20/2014  Visit Diagnosis: S/P aortic valve replacement  Patient's Home Medications on Admission:  Current outpatient prescriptions:  .  acetaminophen (TYLENOL) 325 MG tablet, Take by mouth., Disp: , Rfl:  .  atorvastatin (LIPITOR) 20 MG tablet, Take by mouth., Disp: , Rfl:  .  Bioflavonoid Products (BIOFLEX PO), Take 1 tablet by mouth every morning., Disp: , Rfl:  .  clopidogrel (PLAVIX) 75 MG tablet, Take by mouth., Disp: , Rfl:  .  levothyroxine (SYNTHROID, LEVOTHROID) 88 MCG tablet, TAKE 1 TABLET DAILY ON AN EMPTY STOMACH WITH A GLASS OF WATER AT LEAST 30 TO 60 MINUTES BEFORE BREAKFAST, Disp: , Rfl:  .  metoprolol succinate (TOPROL-XL) 50 MG 24 hr tablet, Take by mouth., Disp: , Rfl:  .  Omega-3 Fatty Acids (FISH OIL) 1000 MG CAPS, Take 2 capsules by mouth every morning., Disp: , Rfl:  .  pioglitazone (ACTOS) 30 MG tablet, TAKE 1 TABLET EVERY MORNING, Disp: , Rfl:   Past Medical History: No past medical history on file.  Tobacco Use: History  Smoking status  . Former Smoker -- 0.25 packs/day for 5 years  . Types: Cigarettes  . Quit date: 05/22/1968  Smokeless tobacco  . Not on file    Labs: Recent Review Flowsheet Data    There is no flowsheet data to display.       Exercise Target Goals:    Exercise Program Goal: Individual exercise prescription set with THRR, safety & activity barriers. Participant demonstrates ability to understand and report RPE using BORG scale, to self-measure pulse accurately, and to acknowledge the importance of the exercise prescription.  Exercise Prescription Goal: Starting with aerobic activity 30 plus minutes a day, 3 days per week for initial exercise prescription. Provide home exercise prescription and guidelines that participant acknowledges understanding  prior to discharge.  Activity Barriers & Risk Stratification:   6 Minute Walk:     6 Minute Walk      11/11/14 1014       6 Minute Walk   Phase Discharge     Distance 1180 feet     Walk Time 6 minutes     Resting HR 98 bpm     Resting BP 130/80 mmHg     Max Ex. HR 104 bpm     Max Ex. BP 148/70 mmHg     RPE 12     Symptoms No        Initial Exercise Prescription:   Exercise Prescription Changes:     Exercise Prescription Changes      10/05/14 0800 10/11/14 0700 11/02/14 0800 11/11/14 1000     Exercise Review   Progression Yes Yes Yes Yes    Response to Exercise   Blood Pressure (Admit)  136/82 mmHg 136/82 mmHg 136/82 mmHg    Blood Pressure (Exercise)  144/82 mmHg 144/82 mmHg 144/82 mmHg    Blood Pressure (Exit)  120/70 mmHg 120/70 mmHg 120/70 mmHg    Heart Rate (Admit)  82 bpm 82 bpm 82 bpm    Heart Rate (Exercise)  97 bpm 97 bpm 97 bpm    Heart Rate (Exit)  82 bpm 82 bpm 82 bpm    Rating of Perceived Exertion (Exercise)  _0 Comments    Plans after program: Walk at the park and has a stationary  bike at her house    Frequency  Add 1 additional day to program exercise sessions. Add 1 additional day to program exercise sessions. Add 1 additional day to program exercise sessions.    Duration  Progress to 30 minutes of continuous aerobic without signs/symptoms of physical distress Progress to 30 minutes of continuous aerobic without signs/symptoms of physical distress Progress to 30 minutes of continuous aerobic without signs/symptoms of physical distress    Intensity  THRR unchanged THRR unchanged THRR unchanged    Progression  Continue progressive overload as per policy without signs/symptoms or physical distress. Continue progressive overload as per policy without signs/symptoms or physical distress. Continue progressive overload as per policy without signs/symptoms or physical distress.    Resistance Training   Training Prescription Yes Yes Yes Yes    Weight  _0 Reps 10-12 10-12 10-12 10-12    Treadmill   MPH  1.5 1.6 1.6    Grade  0 0 0    Minutes  _1 NuStep   Level _2 Watts 45 50 50 50    Minutes  _3 Discharge Exercise Prescription (Final Exercise Prescription Changes):     Exercise Prescription Changes - 11/11/14 1000    Exercise Review   Progression Yes   Response to Exercise   Blood Pressure (Admit) 136/82 mmHg   Blood Pressure (Exercise) 144/82 mmHg   Blood Pressure (Exit) 120/70 mmHg   Heart Rate (Admit) 82 bpm   Heart Rate (Exercise) 97 bpm   Heart Rate (Exit) 82 bpm   Rating of Perceived Exertion (Exercise) 15   Comments Plans after program: Walk at the park and has a stationary bike at her house   Frequency Add 1 additional day to program exercise sessions.   Duration Progress to 30 minutes of continuous aerobic without signs/symptoms of physical distress   Intensity THRR unchanged   Progression Continue progressive overload as per policy without signs/symptoms or physical distress.   Resistance Training   Training Prescription Yes   Weight 2   Reps 10-12   Treadmill   MPH 1.6   Grade 0   Minutes 15   NuStep   Level 4   Watts 50   Minutes 25      Nutrition:  Target Goals: Understanding of nutrition guidelines, daily intake of sodium <157m, cholesterol <2085m calories 30% from fat and 7% or less from saturated fats, daily to have 5 or more servings of fruits and vegetables.  Biometrics:      Post Biometrics - 11/11/14 1014     Post  Biometrics   Weight 197 lb (89.359 kg)   Waist Circumference 33.5 inches   Hip Circumference 42 inches   Waist to Hip Ratio 0.8 %      Nutrition Therapy Plan and Nutrition Goals:   Nutrition Discharge: Rate Your Plate Scores:     Rate Your Plate - 0676/54/6510354  Rate Your Plate Scores   Post Score 61   Post Score % 70 %      Nutrition Goals Re-Evaluation:   Psychosocial: Target Goals: Acknowledge presence or  absence of depression, maximize coping skills, provide positive support system. Participant is able to verbalize types and ability to use techniques and skills needed for reducing stress and depression.  Initial Review & Psychosocial Screening:   Quality of Life Scores:  Quality of Life - 11/02/14 1128    Quality of Life Scores   Health/Function Pre 21.63 %   Health/Function Post 23.11 %   Health/Function % Change 7 %   Socioeconomic Pre 22.08 %   Socioeconomic Post 25.25 %   Socioeconomic % Change 14 %   Psych/Spiritual Pre 25.2 %   Psych/Spiritual Post 25.75 %   Psych/Spiritual % Change 2 %   Family Pre 28.5 %   Family Post 26.38 %   Family % Change -7 %   GLOBAL Pre 23.23 %   GLOBAL Post 26.38 %   GLOBAL % Change 5 %      PHQ-9:     Recent Review Flowsheet Data    Depression screen [facility] 2/9 11/02/2014 09/20/2014   Decreased Interest 0 2   Down, Depressed, Hopeless 0 0   PHQ - 2 Score 0 2   Altered sleeping 0 0   Tired, decreased energy 0 0   Change in appetite 0 0   Feeling bad or failure about yourself  0 0   Trouble concentrating 0 0   Moving slowly or fidgety/restless 0 0   Suicidal thoughts 0 0   PHQ-9 Score 0 2   Difficult doing work/chores Not difficult at all Not difficult at all      Psychosocial Evaluation and Intervention:     Psychosocial Evaluation - 11/16/14 0937    Discharge Psychosocial Assessment & Intervention   Comments Counselor met with Ms. Starling today reporting she is "graduating" from this program today and has increased her strength and stamina significantly.  She came in originally complaining of tightness in her chest when she worked out and was afraid to raise her arm above her head.  Now she exercises with greater ease and no tightness in her chest.  She reports an ability to stand and walk for increased periods of time without tiring as easily.  She plans to walk consistently with her group of friends in order to maintain this  progress.  Ms. Carnegie expressed appreciation for the staff here and reported benefits from all of the educational components of this program as well.        Psychosocial Re-Evaluation:   Vocational Rehabilitation: Provide vocational rehab assistance to qualifying candidates.   Vocational Rehab Evaluation & Intervention:   Education: Education Goals: Education classes will be provided on a weekly basis, covering required topics. Participant will state understanding/return demonstration of topics presented.  Learning Barriers/Preferences:   Education Topics: General Nutrition Guidelines/Fats and Fiber: -Group instruction provided by verbal, written material, models and posters to present the general guidelines for heart healthy nutrition. Gives an explanation and review of dietary fats and fiber.   Controlling Sodium/Reading Food Labels: -Group verbal and written material supporting the discussion of sodium use in heart healthy nutrition. Review and explanation with models, verbal and written materials for utilization of the food label.   Exercise Physiology & Risk Factors: - Group verbal and written instruction with models to review the exercise physiology of the cardiovascular system and associated critical values. Details cardiovascular disease risk factors and the goals associated with each risk factor.   Aerobic Exercise & Resistance Training: - Gives group verbal and written discussion on the health impact of inactivity. On the components of aerobic and resistive training programs and the benefits of this training and how to safely progress through these programs.   Flexibility, Balance, General Exercise Guidelines: - Provides group verbal and written instruction on the benefits  of flexibility and balance training programs. Provides general exercise guidelines with specific guidelines to those with heart or lung disease. Demonstration and skill practice provided.   Stress  Management: - Provides group verbal and written instruction about the health risks of elevated stress, cause of high stress, and healthy ways to reduce stress.   Depression: - Provides group verbal and written instruction on the correlation between heart/lung disease and depressed mood, treatment options, and the stigmas associated with seeking treatment.   Anatomy & Physiology of the Heart: - Group verbal and written instruction and models provide basic cardiac anatomy and physiology, with the coronary electrical and arterial systems. Review of: AMI, Angina, Valve disease, Heart Failure, Cardiac Arrhythmia, Pacemakers, and the ICD.   Cardiac Procedures: - Group verbal and written instruction and models to describe the testing methods done to diagnose heart disease. Reviews the outcomes of the test results. Describes the treatment choices: Medical Management, Angioplasty, or Coronary Bypass Surgery.   Cardiac Medications: - Group verbal and written instruction to review commonly prescribed medications for heart disease. Reviews the medication, class of the drug, and side effects. Includes the steps to properly store meds and maintain the prescription regimen.   Go Sex-Intimacy & Heart Disease, Get SMART - Goal Setting: - Group verbal and written instruction through game format to discuss heart disease and the return to sexual intimacy. Provides group verbal and written material to discuss and apply goal setting through the application of the S.M.A.R.T. Method.   Other Matters of the Heart: - Provides group verbal, written materials and models to describe Heart Failure, Angina, Valve Disease, and Diabetes in the realm of heart disease. Includes description of the disease process and treatment options available to the cardiac patient.   Exercise & Equipment Safety: - Individual verbal instruction and demonstration of equipment use and safety with use of the equipment.   Infection  Prevention: - Provides verbal and written material to individual with discussion of infection control including proper hand washing and proper equipment cleaning during exercise session.   Falls Prevention: - Provides verbal and written material to individual with discussion of falls prevention and safety.   Diabetes: - Individual verbal and written instruction to review signs/symptoms of diabetes, desired ranges of glucose level fasting, after meals and with exercise. Advice that pre and post exercise glucose checks will be done for 3 sessions at entry of program.    Knowledge Questionnaire Score:     Knowledge Questionnaire Score - 11/16/14 0704    Knowledge Questionnaire Score   Pre Score 20/28   Post Score 21/28      Personal Goals and Risk Factors at Admission:   Personal Goals and Risk Factors Review:      Goals and Risk Factor Review      10/11/14 1555 10/21/14 1258         Weight Management   Goals Progress/Improvement seen Yes No      Comments Weight down a few pounds       Increase Aerobic Exercise and Physical Activity   Goals Progress/Improvement seen  Yes Yes      Comments Doing well with exercise prescription Enjoys the exercise and looks forward to coming to class. Is very proud of the fact that she can walk on the treadmill for over 15 minutes. She is noticing positive changes due to the consistent exercise and enjoys being able to move around better and is now not scared to do work or work up a sweat.  [facility] rehab classes have given her confidence in her physical abilities.       Understand more about Heart/Pulmonary Disease   Goals Progress/Improvement seen   Yes      Comments  The education lessons have been very helpful and she has learned something from each lesson.       Diabetes   Goal --  Good blood sugar contol noted during program session       Hypertension   Goal --  Blood pressure in controlled range       Abnormal Lipids   Goal --   Following exercise guidelines. No labs to see any changes          Personal Goals Discharge:     Comments: Discharged/completed the program

## 2015-12-10 IMAGING — CT CT HEAD WITHOUT CONTRAST
1 series · 16 of 30 positions shown, 20 images · non-contrast
Comparison: None.

CLINICAL DATA: 87-year-old female with complaint of left eye visual
changes. Initial encounter.

EXAM:
CT HEAD WITHOUT CONTRAST
TECHNIQUE: Contiguous axial images were obtained from the base of the skull
through the vertex without intravenous contrast.

[Series 2: head wo · axial · 0.43mm/px · z∈[+434,+578]mm · 16 of 36 slices shown, 20 images]
[im 2/36  brain]
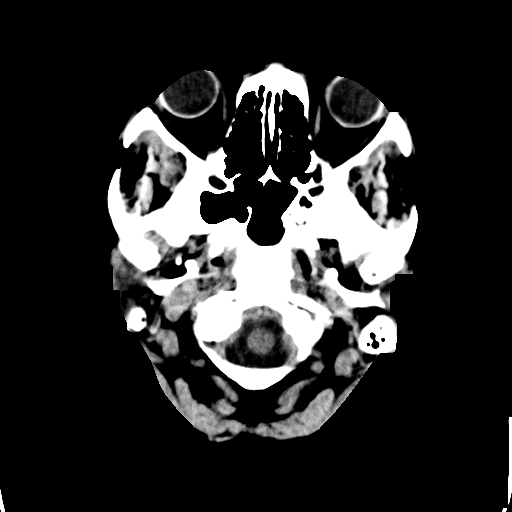
[im 2/36  bone]
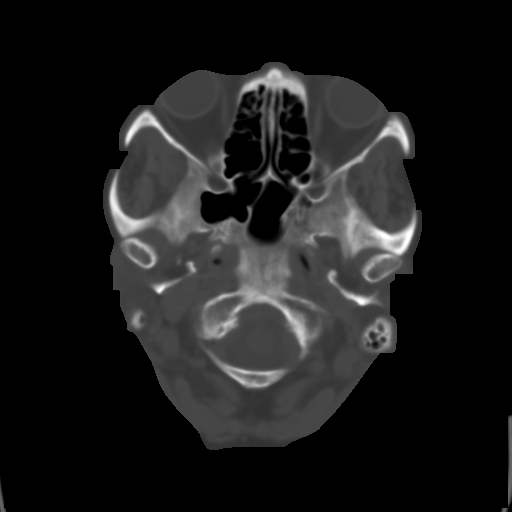
[im 4/36  brain]
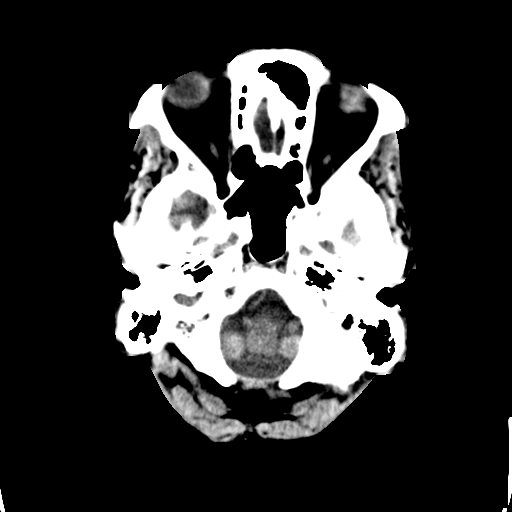
[im 7/36  brain]
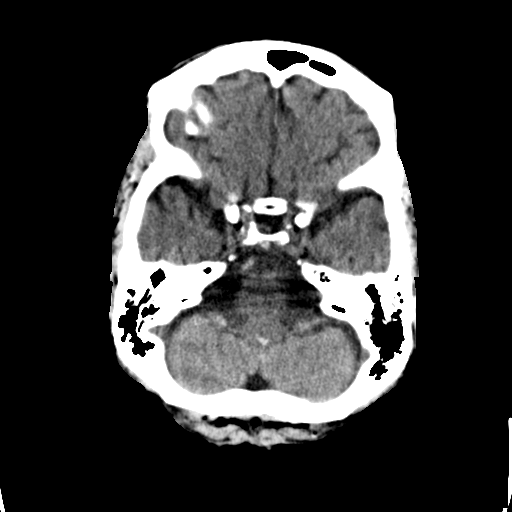
[im 9/36  brain]
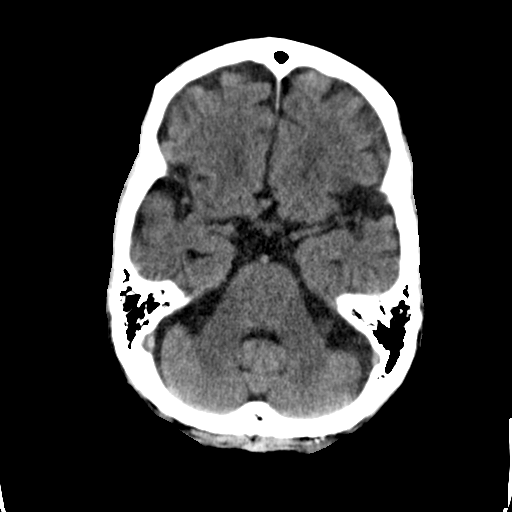
[im 10/36  brain]
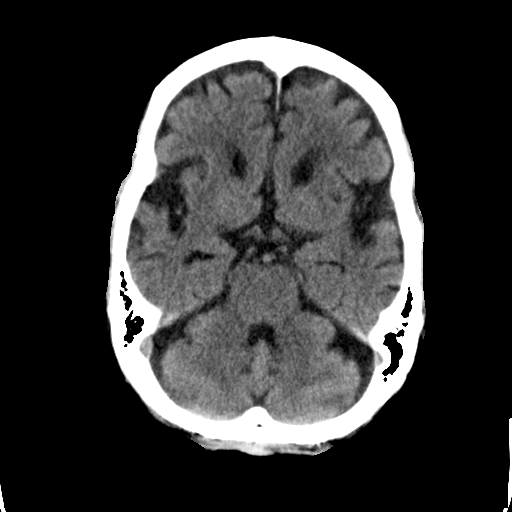
[im 10/36  bone]
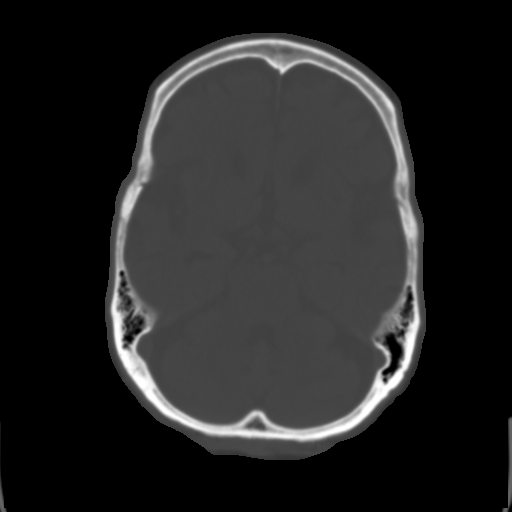
[im 13/36  brain]
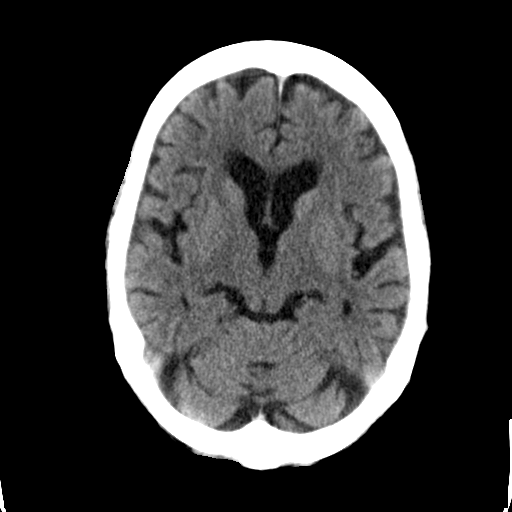
[im 15/36  brain]
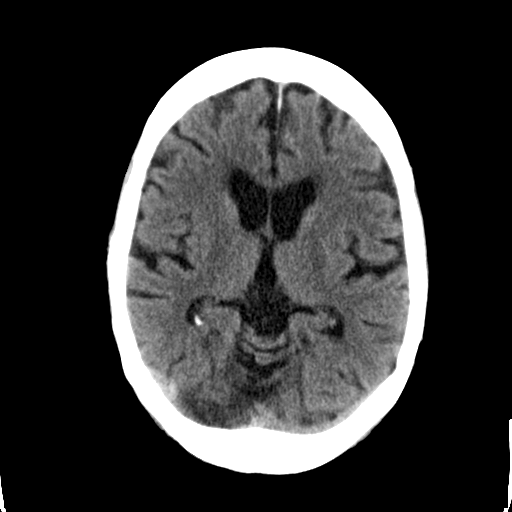
[im 17/36  brain]
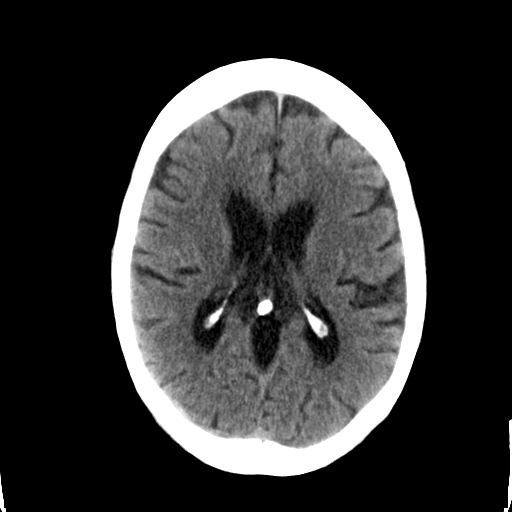
[im 19/36  brain]
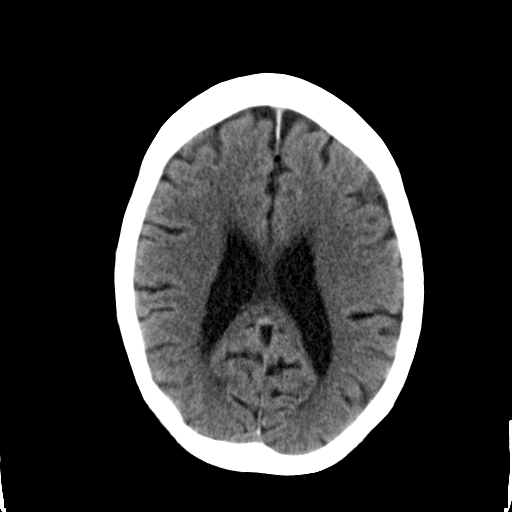
[im 19/36  bone]
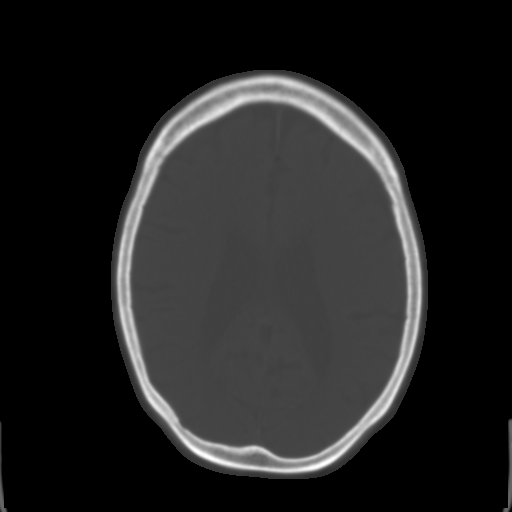
[im 21/36  brain]
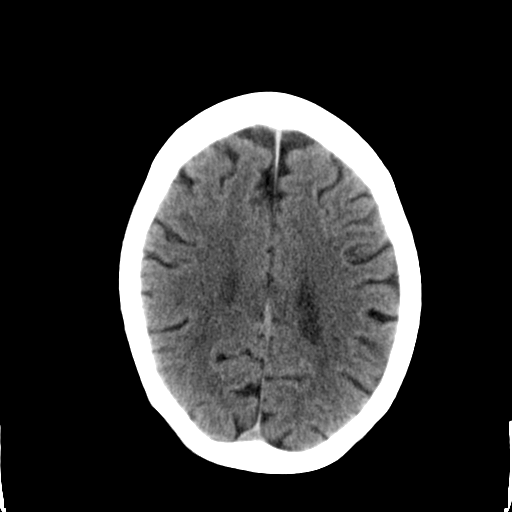
[im 23/36  brain]
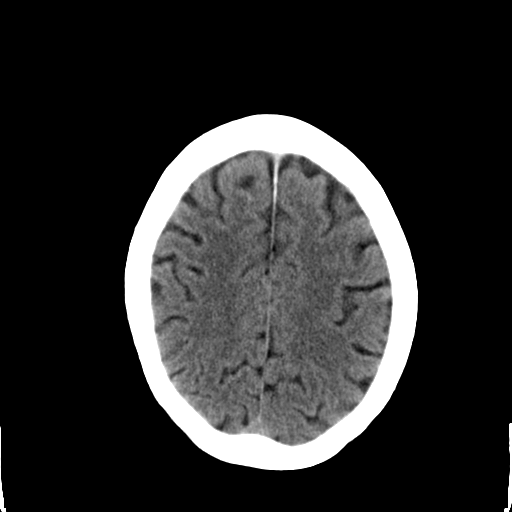
[im 26/36  brain]
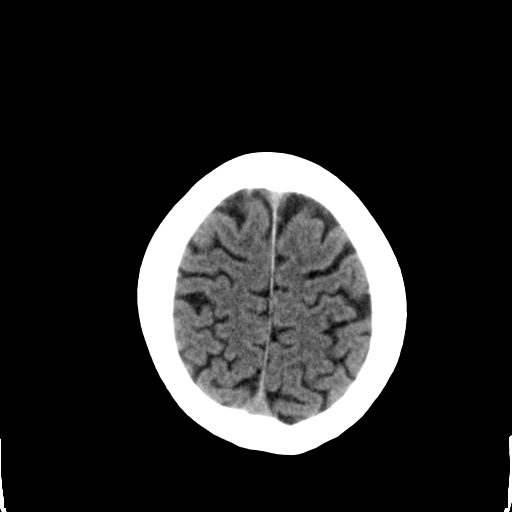
[im 27/36  brain]
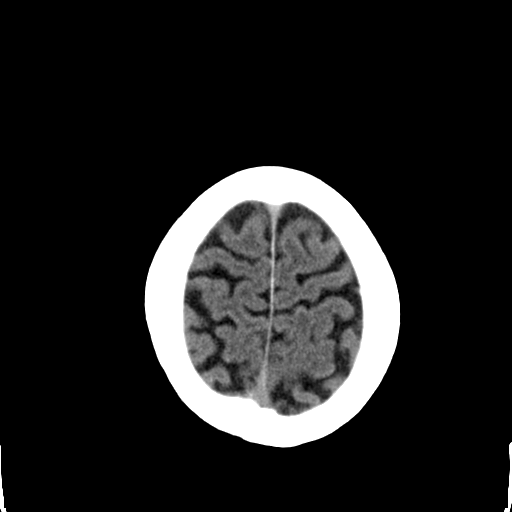
[im 27/36  bone]
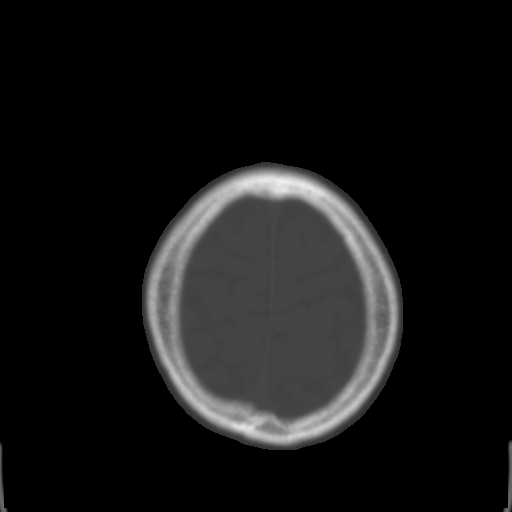
[im 29/36  brain]
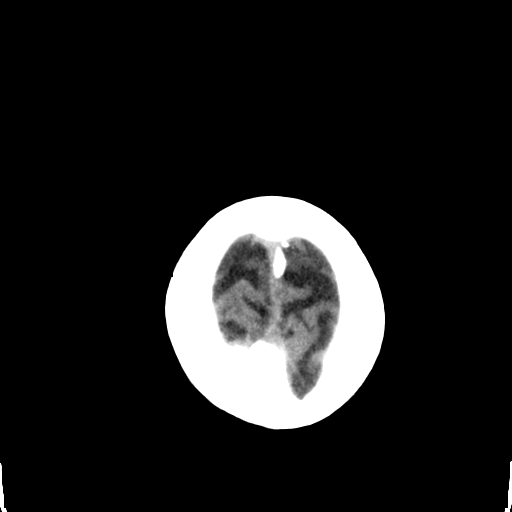
[im 32/36  brain]
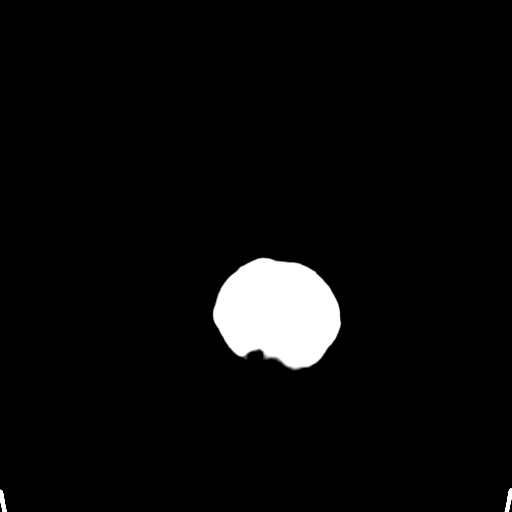
[im 34/36  brain]
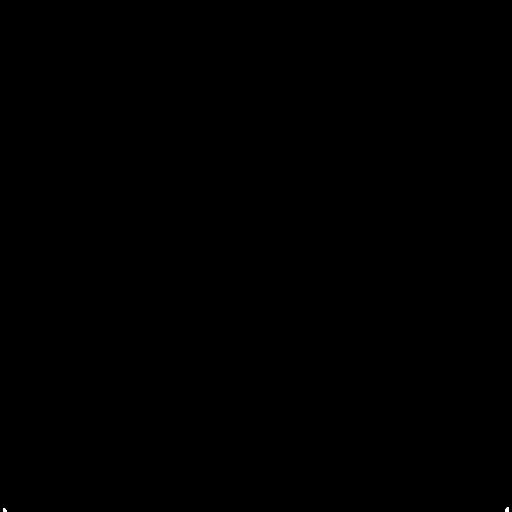

[16 of 30 positions shown; findings below may reference images not displayed]

FINDINGS: No intracranial hemorrhage.

Small vessel disease type changes without CT evidence of large acute
infarct.

Global atrophy more notable involving the cerebellum. No
hydrocephalus.

No intracranial mass lesion noted on this unenhanced exam.

Carotid artery calcifications.

Limited imaging of orbital structures unremarkable.
IMPRESSION: No intracranial hemorrhage.

Small vessel disease type changes without CT evidence of large acute
infarct.

Atrophy more notable involving the cerebellum.

## 2019-05-28 ENCOUNTER — Other Ambulatory Visit: Payer: Self-pay

## 2019-05-28 ENCOUNTER — Emergency Department: Payer: Medicare Other

## 2019-05-28 ENCOUNTER — Inpatient Hospital Stay
Admission: EM | Admit: 2019-05-28 | Discharge: 2019-06-03 | DRG: 494 | Disposition: A | Discharge status: Skilled Nursing Facility | Payer: Medicare Other | Attending: Internal Medicine | Admitting: Internal Medicine

## 2019-05-28 ENCOUNTER — Encounter: Payer: Self-pay | Admitting: Emergency Medicine

## 2019-05-28 ENCOUNTER — Inpatient Hospital Stay: Payer: Medicare Other

## 2019-05-28 DIAGNOSIS — E039 Hypothyroidism, unspecified: Secondary | ICD-10-CM | POA: Diagnosis present

## 2019-05-28 DIAGNOSIS — Z79890 Hormone replacement therapy (postmenopausal): Secondary | ICD-10-CM | POA: Diagnosis not present

## 2019-05-28 DIAGNOSIS — D539 Nutritional anemia, unspecified: Secondary | ICD-10-CM | POA: Diagnosis present

## 2019-05-28 DIAGNOSIS — S82861A Displaced Maisonneuve's fracture of right leg, initial encounter for closed fracture: Secondary | ICD-10-CM

## 2019-05-28 DIAGNOSIS — I9789 Other postprocedural complications and disorders of the circulatory system, not elsewhere classified: Secondary | ICD-10-CM | POA: Diagnosis not present

## 2019-05-28 DIAGNOSIS — S82301A Unspecified fracture of lower end of right tibia, initial encounter for closed fracture: Secondary | ICD-10-CM | POA: Diagnosis not present

## 2019-05-28 DIAGNOSIS — Z950 Presence of cardiac pacemaker: Secondary | ICD-10-CM

## 2019-05-28 DIAGNOSIS — Z96651 Presence of right artificial knee joint: Secondary | ICD-10-CM | POA: Diagnosis present

## 2019-05-28 DIAGNOSIS — M79661 Pain in right lower leg: Secondary | ICD-10-CM | POA: Diagnosis present

## 2019-05-28 DIAGNOSIS — I442 Atrioventricular block, complete: Secondary | ICD-10-CM | POA: Diagnosis not present

## 2019-05-28 DIAGNOSIS — S82831A Other fracture of upper and lower end of right fibula, initial encounter for closed fracture: Secondary | ICD-10-CM | POA: Diagnosis present

## 2019-05-28 DIAGNOSIS — S82191G Other fracture of upper end of right tibia, subsequent encounter for closed fracture with delayed healing: Secondary | ICD-10-CM | POA: Diagnosis not present

## 2019-05-28 DIAGNOSIS — I10 Essential (primary) hypertension: Secondary | ICD-10-CM | POA: Diagnosis present

## 2019-05-28 DIAGNOSIS — Z79899 Other long term (current) drug therapy: Secondary | ICD-10-CM

## 2019-05-28 DIAGNOSIS — S82302G Unspecified fracture of lower end of left tibia, subsequent encounter for closed fracture with delayed healing: Secondary | ICD-10-CM | POA: Diagnosis not present

## 2019-05-28 DIAGNOSIS — W010XXA Fall on same level from slipping, tripping and stumbling without subsequent striking against object, initial encounter: Secondary | ICD-10-CM | POA: Diagnosis present

## 2019-05-28 DIAGNOSIS — Y9389 Activity, other specified: Secondary | ICD-10-CM | POA: Diagnosis not present

## 2019-05-28 DIAGNOSIS — E119 Type 2 diabetes mellitus without complications: Secondary | ICD-10-CM | POA: Diagnosis present

## 2019-05-28 DIAGNOSIS — Y92018 Other place in single-family (private) house as the place of occurrence of the external cause: Secondary | ICD-10-CM | POA: Diagnosis not present

## 2019-05-28 DIAGNOSIS — S82191A Other fracture of upper end of right tibia, initial encounter for closed fracture: Secondary | ICD-10-CM

## 2019-05-28 DIAGNOSIS — W19XXXA Unspecified fall, initial encounter: Secondary | ICD-10-CM | POA: Diagnosis not present

## 2019-05-28 DIAGNOSIS — Z833 Family history of diabetes mellitus: Secondary | ICD-10-CM | POA: Diagnosis not present

## 2019-05-28 DIAGNOSIS — Z952 Presence of prosthetic heart valve: Secondary | ICD-10-CM

## 2019-05-28 DIAGNOSIS — I4510 Unspecified right bundle-branch block: Secondary | ICD-10-CM

## 2019-05-28 DIAGNOSIS — Y92009 Unspecified place in unspecified non-institutional (private) residence as the place of occurrence of the external cause: Secondary | ICD-10-CM

## 2019-05-28 DIAGNOSIS — Z87891 Personal history of nicotine dependence: Secondary | ICD-10-CM | POA: Diagnosis not present

## 2019-05-28 DIAGNOSIS — Z7902 Long term (current) use of antithrombotics/antiplatelets: Secondary | ICD-10-CM | POA: Diagnosis not present

## 2019-05-28 DIAGNOSIS — Z20822 Contact with and (suspected) exposure to covid-19: Secondary | ICD-10-CM | POA: Diagnosis present

## 2019-05-28 DIAGNOSIS — W19XXXD Unspecified fall, subsequent encounter: Secondary | ICD-10-CM | POA: Diagnosis not present

## 2019-05-28 DIAGNOSIS — S82202A Unspecified fracture of shaft of left tibia, initial encounter for closed fracture: Secondary | ICD-10-CM | POA: Diagnosis present

## 2019-05-28 DIAGNOSIS — Z419 Encounter for procedure for purposes other than remedying health state, unspecified: Secondary | ICD-10-CM

## 2019-05-28 DIAGNOSIS — S82391A Other fracture of lower end of right tibia, initial encounter for closed fracture: Principal | ICD-10-CM | POA: Diagnosis present

## 2019-05-28 DIAGNOSIS — E1169 Type 2 diabetes mellitus with other specified complication: Secondary | ICD-10-CM

## 2019-05-28 DIAGNOSIS — S82209A Unspecified fracture of shaft of unspecified tibia, initial encounter for closed fracture: Secondary | ICD-10-CM

## 2019-05-28 HISTORY — DX: Unspecified osteoarthritis, unspecified site: M19.90

## 2019-05-28 HISTORY — DX: Type 2 diabetes mellitus without complications: E11.9

## 2019-05-28 LAB — CBC WITH DIFFERENTIAL/PLATELET
Abs Immature Granulocytes: 0.02 10*3/uL (ref 0.00–0.07)
Basophils Absolute: 0 10*3/uL (ref 0.0–0.1)
Basophils Relative: 0 %
Eosinophils Absolute: 0 10*3/uL (ref 0.0–0.5)
Eosinophils Relative: 0 %
HCT: 38.6 % (ref 36.0–46.0)
Hemoglobin: 12.3 g/dL (ref 12.0–15.0)
Immature Granulocytes: 0 %
Lymphocytes Relative: 18 %
Lymphs Abs: 1.4 10*3/uL (ref 0.7–4.0)
MCH: 32.5 pg (ref 26.0–34.0)
MCHC: 31.9 g/dL (ref 30.0–36.0)
MCV: 101.8 fL — ABNORMAL HIGH (ref 80.0–100.0)
Monocytes Absolute: 0.6 10*3/uL (ref 0.1–1.0)
Monocytes Relative: 8 %
Neutro Abs: 5.5 10*3/uL (ref 1.7–7.7)
Neutrophils Relative %: 74 %
Platelets: 192 10*3/uL (ref 150–400)
RBC: 3.79 MIL/uL — ABNORMAL LOW (ref 3.87–5.11)
RDW: 14.3 % (ref 11.5–15.5)
WBC: 7.5 10*3/uL (ref 4.0–10.5)
nRBC: 0 % (ref 0.0–0.2)

## 2019-05-28 LAB — HEMOGLOBIN A1C
Hgb A1c MFr Bld: 6.6 % — ABNORMAL HIGH (ref 4.8–5.6)
Mean Plasma Glucose: 142.72 mg/dL

## 2019-05-28 LAB — BASIC METABOLIC PANEL
Anion gap: 6 (ref 5–15)
BUN: 15 mg/dL (ref 8–23)
CO2: 26 mmol/L (ref 22–32)
Calcium: 9.5 mg/dL (ref 8.9–10.3)
Chloride: 109 mmol/L (ref 98–111)
Creatinine, Ser: 0.88 mg/dL (ref 0.44–1.00)
GFR calc Af Amer: >60 mL/min (ref >60)
GFR calc non Af Amer: 58 mL/min — ABNORMAL LOW (ref >60)
Glucose, Bld: 123 mg/dL — ABNORMAL HIGH (ref 70–99)
Potassium: 3.9 mmol/L (ref 3.5–5.1)
Sodium: 141 mmol/L (ref 135–145)

## 2019-05-28 LAB — PROTIME-INR
INR: 1 (ref 0.8–1.2)
Prothrombin Time: 13.3 seconds (ref 11.4–15.2)

## 2019-05-28 LAB — GLUCOSE, CAPILLARY: Glucose-Capillary: 99 mg/dL (ref 70–99)

## 2019-05-28 LAB — TSH: TSH: 0.133 u[IU]/mL — ABNORMAL LOW (ref 0.350–4.500)

## 2019-05-28 LAB — SARS CORONAVIRUS 2 (TAT 6-24 HRS): SARS Coronavirus 2: NEGATIVE

## 2019-05-28 MED ORDER — CEFAZOLIN SODIUM-DEXTROSE 2-4 GM/100ML-% IV SOLN
2.0000 g | Freq: Once | INTRAVENOUS | Status: AC
  Administered 2019-05-29: 15:00:00 2 g via INTRAVENOUS
  Filled 2019-05-28: qty 100

## 2019-05-28 MED ORDER — SENNOSIDES-DOCUSATE SODIUM 8.6-50 MG PO TABS
1.0000 | ORAL_TABLET | Freq: Every evening | ORAL | Status: DC | PRN
  Administered 2019-05-31: 1 via ORAL
  Filled 2019-05-28: qty 1

## 2019-05-28 MED ORDER — KETOROLAC TROMETHAMINE 15 MG/ML IJ SOLN
15.0000 mg | Freq: Four times a day (QID) | INTRAMUSCULAR | Status: DC | PRN
  Filled 2019-05-28: qty 1

## 2019-05-28 MED ORDER — BISACODYL 5 MG PO TBEC: 5.0000 mg | DELAYED_RELEASE_TABLET | Freq: Every day | ORAL | Status: DC | PRN

## 2019-05-28 MED ORDER — SODIUM CHLORIDE 0.9 % IV SOLN: INTRAVENOUS | Status: AC

## 2019-05-28 MED ORDER — IPRATROPIUM-ALBUTEROL 0.5-2.5 (3) MG/3ML IN SOLN: 3.0000 mL | RESPIRATORY_TRACT | Status: DC | PRN

## 2019-05-28 MED ORDER — ONDANSETRON HCL 4 MG PO TABS: 4.0000 mg | ORAL_TABLET | Freq: Four times a day (QID) | ORAL | Status: DC | PRN

## 2019-05-28 MED ORDER — INSULIN ASPART 100 UNIT/ML ~~LOC~~ SOLN
0.0000 [IU] | Freq: Three times a day (TID) | SUBCUTANEOUS | Status: DC
  Administered 2019-05-30: 3 [IU] via SUBCUTANEOUS
  Administered 2019-05-31 – 2019-06-01 (×2): 2 [IU] via SUBCUTANEOUS
  Administered 2019-06-01: 3 [IU] via SUBCUTANEOUS
  Administered 2019-06-02 – 2019-06-03 (×4): 2 [IU] via SUBCUTANEOUS
  Filled 2019-05-28 (×6): qty 1

## 2019-05-28 MED ORDER — INSULIN ASPART 100 UNIT/ML ~~LOC~~ SOLN: 0.0000 [IU] | Freq: Every day | SUBCUTANEOUS | Status: DC

## 2019-05-28 MED ORDER — OXYCODONE HCL 5 MG PO TABS: 5.0000 mg | ORAL_TABLET | ORAL | Status: DC | PRN

## 2019-05-28 MED ORDER — ACETAMINOPHEN 325 MG PO TABS: 650.0000 mg | ORAL_TABLET | Freq: Four times a day (QID) | ORAL | Status: DC | PRN

## 2019-05-28 MED ORDER — ACETAMINOPHEN 650 MG RE SUPP: 650.0000 mg | Freq: Four times a day (QID) | RECTAL | Status: DC | PRN

## 2019-05-28 MED ORDER — ONDANSETRON HCL 4 MG/2ML IJ SOLN: 4.0000 mg | Freq: Four times a day (QID) | INTRAMUSCULAR | Status: DC | PRN

## 2019-05-28 MED ORDER — INSULIN ASPART 100 UNIT/ML ~~LOC~~ SOLN
3.0000 [IU] | Freq: Three times a day (TID) | SUBCUTANEOUS | Status: DC
  Administered 2019-05-30 – 2019-06-03 (×11): 3 [IU] via SUBCUTANEOUS
  Filled 2019-05-28 (×9): qty 1

## 2019-05-28 NOTE — ED Provider Notes (Addendum)
[facility] Emergency Department Provider Note  ____________________________________________  Time seen: Approximately 4:57 PM  I have reviewed the triage vital signs and the nursing notes.   HISTORY  Chief Complaint Fall    HPI [name] is a 86 y.o. female with a history of arthritis, diabetes, cardiac pacemaker who was in her usual state of health until today when she was taking out the trash and slipped on the wet ground.  She did not hit her head or lose consciousness, only complaint is right lower leg pain.  She was unable to stand and had to crawl back to the house.  Pain is currently moderate intensity, worse with movement, better when she is still.  Nonradiating.  Denies numbness or tingling or weakness of the lower leg/foot   She has had prior right knee replacement   Past Medical History:  Diagnosis Date  . Arthritis   . Diabetes mellitus without complication [facility])      Patient Active Problem List   Diagnosis Date Noted  . Fall 05/28/2019  . Tibia fracture 05/28/2019  . Essential (primary) hypertension 05/30/2014  . Combined fat and carbohydrate induced hyperlipemia 05/30/2014  . Diabetes mellitus, type 2 (HCC) 05/30/2014  . Artificial cardiac pacemaker 03/11/2014  . H/O aortic valve replacement 03/01/2014  . Complete heart block, post-surgical (HCC) 02/18/2014  . Bundle branch block, right 09/28/2013  . AI (aortic incompetence) 09/24/2013  . Aortic heart valve narrowing 09/24/2013  . Diabetes (HCC) 09/24/2013  . Breath shortness 09/24/2013  . BP (high blood pressure) 09/24/2013  . Adult hypothyroidism 09/24/2013  . MI (mitral incompetence) 09/24/2013  . Arthritis, degenerative 09/24/2013        Prior to Admission medications   Medication Sig Start Date End Date Taking? Authorizing Provider  acetaminophen (TYLENOL) 325 MG tablet Take by mouth.    [provider]  atorvastatin (LIPITOR) 20 MG tablet Take by  mouth. 05/18/14 05/18/15  [provider]  Bioflavonoid Products (BIOFLEX PO) Take 1 tablet by mouth every morning.    [provider]  clopidogrel (PLAVIX) 75 MG tablet Take by mouth.    [provider]  levothyroxine (SYNTHROID, LEVOTHROID) 88 MCG tablet TAKE 1 TABLET DAILY ON AN EMPTY STOMACH WITH A GLASS OF WATER AT LEAST 30 TO 60 MINUTES BEFORE BREAKFAST 08/10/14   [provider]  metoprolol succinate (TOPROL-XL) 50 MG 24 hr tablet Take by mouth. 06/29/14 06/29/15  [provider]  Omega-3 Fatty Acids (FISH OIL) 1000 MG CAPS Take 2 capsules by mouth every morning.    [provider]  pioglitazone (ACTOS) 30 MG tablet TAKE 1 TABLET EVERY MORNING 03/28/14   [provider]     Allergies Patient has no known allergies.   No family history on file.  Social History Social History   Tobacco Use  . Smoking status: Former Smoker    Packs/day: 0.25    Years: 5.00    Pack years: 1.25    Types: Cigarettes    Quit date: 05/22/1968    Years since quitting: 51.0  Substance Use Topics  . Alcohol use: Not on file  . Drug use: Not on file    Review of Systems  Constitutional:   No fever or chills.  ENT:   No sore throat. No rhinorrhea. Cardiovascular:   No chest pain or syncope. Respiratory:   No dyspnea or cough. Gastrointestinal:   Negative for abdominal pain, vomiting and diarrhea.  Musculoskeletal: Right lower leg pain as  above All other systems reviewed and are negative except as documented above in ROS and HPI.  ____________________________________________   PHYSICAL EXAM:  VITAL SIGNS: ED Triage Vitals  Enc Vitals Group     BP 05/28/19 1554 (!) 145/87     Pulse Rate 05/28/19 1554 81     Resp 05/28/19 1554 18     Temp 05/28/19 1554 99.2 F (37.3 C)     Temp Source 05/28/19 1554 Oral     SpO2 05/28/19 1554 99 %     Weight 05/28/19 1551 200 lb (90.7 kg)     Height 05/28/19 1551 5\' 8"  (1.727 m)     Head  Circumference --      Peak Flow --      Pain Score 05/28/19 1552 0     Pain Loc --      Pain Edu? --      Excl. in GC? --     Vital signs reviewed, nursing assessments reviewed.   Constitutional:   Alert and oriented. Non-toxic appearance. Eyes:   Conjunctivae are normal. EOMI. PERRL. ENT      Head:   Normocephalic and atraumatic.      Nose:   Wearing a mask.      Mouth/Throat:   Wearing a mask.      Neck:   No meningismus. Full ROM.  No midline spinal tenderness Hematological/Lymphatic/Immunilogical:   No cervical lymphadenopathy. Cardiovascular:   RRR. Symmetric bilateral radial and DP pulses.  No murmurs. Cap refill less than 2 seconds. Respiratory:   Normal respiratory effort without tachypnea/retractions. Breath sounds are clear and equal bilaterally. No wheezes/rales/rhonchi. Gastrointestinal:   Soft and nontender. Non distended. There is no CVA tenderness.  No rebound, rigidity, or guarding.  Musculoskeletal:   Pelvis stable, hips nontender.  Femur is nontender.  No focal tenderness about the knee.  There is crepitus at the right proximal fibula.  There is obvious deformity of the right distal tibia with tenderness in this area and some mild tenting of the skin overlying the palpable fracture.  The right ankle and foot appear to be intact and nontender.  Left lower extremity unremarkable. Neurologic:   Normal speech and language.  Motor grossly intact. No acute focal neurologic deficits are appreciated.  Skin:    Skin is warm, dry with 2 pinpoint skin tears overlying the area of distal tibia fracture of the right leg.  No laceration/open wound over the fracture.. No rash noted.  No petechiae, purpura, or bullae.  ____________________________________________    LABS (pertinent positives/negatives) (all labs ordered are listed, but only abnormal results are displayed) Labs Reviewed  SARS CORONAVIRUS 2 (TAT 6-24 HRS)  BASIC METABOLIC PANEL  CBC WITH DIFFERENTIAL/PLATELET   PROTIME-INR   ____________________________________________   EKG    ____________________________________________    RADIOLOGY  DG Tibia/Fibula Right  Result Date: 05/28/2019 CLINICAL DATA:  Recent fall with right leg pain EXAM: RIGHT TIBIA AND FIBULA - 2 VIEW COMPARISON:  03/17/2008 FINDINGS: Oblique fracture is noted through the proximal fibular shaft. Additionally an oblique comminuted fracture through the distal tibia involving the metaphysis is seen. Some rotational component laterally is noted in the distal lower leg. Changes consistent with prior knee replacement are again seen. IMPRESSION: Proximal fibular and distal tibial fracture with some rotational component laterally. Electronically Signed   By: 03/19/2008 M.D.   On: 05/28/2019 17:02    ____________________________________________   PROCEDURES .Ortho Injury Treatment  Date/Time: 05/28/2019 6:34 PM Performed by: 07/26/2019, MD  Authorized by: Carrie Mew, MD   Consent:    Consent obtained:  Verbal   Consent given by:  Patient   Risks discussed:  Nerve damage   Alternatives discussed:  Delayed treatmentInjury location: lower leg Location details: right lower leg Injury type: fracture Fracture type: tibial and fibular shafts Pre-procedure neurovascular assessment: neurovascularly intact Pre-procedure distal perfusion: normal Pre-procedure neurological function: normal Pre-procedure range of motion: normal  Anesthesia: Local anesthesia used: no  Patient sedated: NoManipulation performed: yes Skin traction used: no Skeletal traction used: yes Reduction successful: yes X-ray confirmed reduction: yes Immobilization: splint Splint type: long leg Supplies used: cotton padding,  Ortho-Glass and elastic bandage Post-procedure neurovascular assessment: post-procedure neurovascularly intact Post-procedure distal perfusion: normal Post-procedure neurological function: normal Post-procedure range of  motion: normal Patient tolerance: patient tolerated the procedure well with no immediate complications Comments: Manipulated to alleviate skin tenting, pain, and fracture rotation. Tolerated well. Leg maintained elevated after splinting.     ____________________________________________    CLINICAL IMPRESSION / ASSESSMENT AND PLAN / ED COURSE  Medications ordered in the ED: Medications  ceFAZolin (ANCEF) IVPB 2g/100 mL premix (has no administration in time range)    Pertinent labs & imaging results that were available during my care of the patient were reviewed by me and considered in my medical decision making (see chart for details).  Zoe [name] was evaluated in Emergency Department on 05/28/2019 for the symptoms described in the history of present illness. She was evaluated in the context of the global COVID-19 pandemic, which necessitated consideration that the patient might be at risk for infection with the SARS-CoV-2 virus that causes COVID-19. Institutional protocols and algorithms that pertain to the evaluation of patients at risk for COVID-19 are in a state of rapid change based on information released by regulatory bodies including the CDC and federal and state organizations. These policies and algorithms were followed during the patient's care in the ED.   Patient presents with right leg pain after a mechanical fall.  Obvious fracture of the distal tibia.  X-ray obtained and viewed by me which confirms a comminuted angulated fracture of the distal tibia as well as a fracture of the proximal fibula concerning for Maisonneuve fracture.  Patient is neurovascular intact without foot drop.  Will discuss with orthopedics.  ----------------------------------------- 5:01 PM on 05/28/2019 -----------------------------------------  Discussed with Dr. Roland Rack who recommends posterior splint and some distal traction to relieve stress on the distal tibia overlying skin.  Recommends  hospitalization for surgical management tomorrow.  Will check preop labs, chest x-ray, Covid screening.   ----------------------------------------- 6:33 PM on 05/28/2019 -----------------------------------------  Initial fracture management provided with manipulation and application of splint.    Remains neurovascular intact.  Skin tenting resolved.      ____________________________________________   FINAL CLINICAL IMPRESSION(S) / ED DIAGNOSES    Final diagnoses:  Maisonneuve fracture of fibula, right, closed, initial encounter  Closed fracture of distal end of right tibia, unspecified fracture morphology, initial encounter  Type 2 diabetes mellitus without complication, without long-term current use of insulin [facility])     ED Discharge Orders    None      Portions of this note were generated with dragon dictation software. Dictation errors may occur despite best attempts at proofreading.   Carrie Mew, MD 05/28/19 [place], MD 05/28/19 Bosie Helper

## 2019-05-28 NOTE — ED Triage Notes (Signed)
Pt arrival via [facility] from home do to fall. Pt states she slipped while putting trash in the garbage can and that she landed on her right leg. Pt states having 9/10 pain when she is moving, but only a 2/10 pain when staying still.     EMS Vitals- 167/94 BG- 157 Temp-99.3  Pt in NAD, no further complaints at this time.

## 2019-05-28 NOTE — ED Notes (Signed)
This nurse attempted to start an IV x2 and Tobi Bastos, RN attempted x1. IV team consult placed at this time. Fluids unable to start due to no IV.

## 2019-05-28 NOTE — H&P (Signed)
History and Physical    Onalee Steinbach [facility] PJK:[id number] DOB: 1926-11-17 DOA: 05/28/2019  PCP: Patient, No Pcp Per Patient coming from: Home  Chief Complaint: Right leg pain  HPI: Zoe [name] is a 86 y.o. female with medical history significant of high degree heart block status post pacemaker, aortic stenosis status post failed TAVR, diabetes mellitus type 2, HTN, hypothyroidism, osteoarthritis presented to the hospital after sustaining a fall with complains of right leg pain.  Patient was outside putting trash away when all of a sudden she buckled her knee and ended up falling causing her right knee pain she denied any chest pain, lightheadedness, palpitations or any other symptoms. In the ER patient was pretty much nonambulatory, routine labs were pending.  Orthopedic was consulted who recommended surgical intervention and admission to medical team.  When I saw the patient she was quite comfortable and did not have any complaints besides having difficulty walking.  Discussed with patient's RN at bedside as well regarding obtaining preop labs and EKG.  Review of Systems: As per HPI otherwise 10 point review of systems negative.  Review of Systems Otherwise negative except as per HPI, including: General: Denies fever, chills, night sweats or unintended weight loss. Resp: Denies cough, wheezing, shortness of breath. Cardiac: Denies chest pain, palpitations, orthopnea, paroxysmal nocturnal dyspnea. GI: Denies abdominal pain, nausea, vomiting, diarrhea or constipation GU: Denies dysuria, frequency, hesitancy or incontinence MS: Denies muscle aches, joint pain or swelling Neuro: Denies headache, neurologic deficits (focal weakness, numbness, tingling), abnormal gait Psych: Denies anxiety, depression, SI/HI/AVH Skin: Denies new rashes or lesions ID: Denies sick contacts, exotic exposures, travel  Past Medical History:  Diagnosis Date  . Arthritis   . Diabetes mellitus without  complication (HCC)     SOCIAL HISTORY:  reports that she quit smoking about 51 years ago. Her smoking use included cigarettes. She has a 1.25 pack-year smoking history. She does not have any smokeless tobacco history on file. No history on file for alcohol and drug.  No Known Allergies  FAMILY HISTORY: Paternal history of diabetes mellitus type 2   Prior to Admission medications   Medication Sig Start Date End Date Taking? Authorizing Provider  acetaminophen (TYLENOL) 325 MG tablet Take by mouth.    [provider]  atorvastatin (LIPITOR) 20 MG tablet Take by mouth. 05/18/14 05/18/15  [provider]  Bioflavonoid Products (BIOFLEX PO) Take 1 tablet by mouth every morning.    [provider]  clopidogrel (PLAVIX) 75 MG tablet Take by mouth.    [provider]  levothyroxine (SYNTHROID, LEVOTHROID) 88 MCG tablet TAKE 1 TABLET DAILY ON AN EMPTY STOMACH WITH A GLASS OF WATER AT LEAST 30 TO 60 MINUTES BEFORE BREAKFAST 08/10/14   [provider]  metoprolol succinate (TOPROL-XL) 50 MG 24 hr tablet Take by mouth. 06/29/14 06/29/15  [provider]  Omega-3 Fatty Acids (FISH OIL) 1000 MG CAPS Take 2 capsules by mouth every morning.    [provider]  pioglitazone (ACTOS) 30 MG tablet TAKE 1 TABLET EVERY MORNING 03/28/14   [provider]    Physical Exam: Vitals:   05/28/19 1554 05/28/19 1630 05/28/19 1700 05/28/19 1730  BP: (!) 145/87 (!) 157/91 (!) 163/94 (!) 152/90  Pulse: 81 80 82 80  Resp: 18   18  Temp: 99.2 F (37.3 C)     TempSrc: Oral     SpO2: 99% 97% 97% 100%  Weight:      Height:  Constitutional: NAD, calm, comfortable Eyes: PERRL, lids and conjunctivae normal ENMT: Mucous membranes are moist. Posterior pharynx clear of any exudate or lesions.Normal dentition.  Neck: normal, supple, no masses, no thyromegaly Respiratory: clear to auscultation bilaterally, no wheezing, no crackles. Normal  respiratory effort. No accessory muscle use.  Cardiovascular: Regular rate and rhythm, no murmurs / rubs / gallops. No extremity edema. 2+ pedal pulses. No carotid bruits.  Abdomen: no tenderness, no masses palpated. No hepatosplenomegaly. Bowel sounds positive.  Musculoskeletal: Limited range of right lower extremity Skin: no rashes, lesions, ulcers. No induration Neurologic: CN 2-12 grossly intact. Sensation intact, DTR normal. Strength 5/5 in all 4.  Psychiatric: Normal judgment and insight. Alert and oriented x 3. Normal mood.     Labs on Admission: I have personally reviewed following labs and imaging studies  CBC: No results for input(s): WBC, NEUTROABS, HGB, HCT, MCV, PLT in the last 168 hours. Basic Metabolic Panel: No results for input(s): NA, K, CL, CO2, GLUCOSE, BUN, CREATININE, CALCIUM, MG, PHOS in the last 168 hours. GFR: CrCl cannot be calculated (Patient's most recent lab result is older than the maximum 21 days allowed.). Liver Function Tests: No results for input(s): AST, ALT, ALKPHOS, BILITOT, PROT, ALBUMIN in the last 168 hours. No results for input(s): LIPASE, AMYLASE in the last 168 hours. No results for input(s): AMMONIA in the last 168 hours. Coagulation Profile: No results for input(s): INR, PROTIME in the last 168 hours. Cardiac Enzymes: No results for input(s): CKTOTAL, CKMB, CKMBINDEX, TROPONINI in the last 168 hours. BNP (last 3 results) No results for input(s): PROBNP in the last 8760 hours. HbA1C: No results for input(s): HGBA1C in the last 72 hours. CBG: No results for input(s): GLUCAP in the last 168 hours. Lipid Profile: No results for input(s): CHOL, HDL, LDLCALC, TRIG, CHOLHDL, LDLDIRECT in the last 72 hours. Thyroid Function Tests: No results for input(s): TSH, T4TOTAL, FREET4, T3FREE, THYROIDAB in the last 72 hours. Anemia Panel: No results for input(s): VITAMINB12, FOLATE, FERRITIN, TIBC, IRON, RETICCTPCT in the last 72 hours. Urine  analysis: No results found for: COLORURINE, APPEARANCEUR, LABSPEC, PHURINE, GLUCOSEU, HGBUR, BILIRUBINUR, KETONESUR, PROTEINUR, UROBILINOGEN, NITRITE, LEUKOCYTESUR Sepsis Labs: !!!!!!!!!!!!!!!!!!!!!!!!!!!!!!!!!!!!!!!!!!!! @LABRCNTIP (procalcitonin:4,lacticidven:4) )No results found for this or any previous visit (from the past 240 hour(s)).   Radiological Exams on Admission: DG Tibia/Fibula Right  Result Date: 05/28/2019 CLINICAL DATA:  Recent fall with right leg pain EXAM: RIGHT TIBIA AND FIBULA - 2 VIEW COMPARISON:  03/17/2008 FINDINGS: Oblique fracture is noted through the proximal fibular shaft. Additionally an oblique comminuted fracture through the distal tibia involving the metaphysis is seen. Some rotational component laterally is noted in the distal lower leg. Changes consistent with prior knee replacement are again seen. IMPRESSION: Proximal fibular and distal tibial fracture with some rotational component laterally. Electronically Signed   By: Inez Catalina M.D.   On: 05/28/2019 17:02   DG Chest Portable 1 View  Result Date: 05/28/2019 CLINICAL DATA:  Recent fall, initial encounter EXAM: PORTABLE CHEST 1 VIEW COMPARISON:  03/16/2008 FINDINGS: Cardiac shadow is within normal limits. Postsurgical changes are seen. Pacing device is noted. Elevation of the right hemidiaphragm is again seen. Mild basilar atelectasis is noted bilaterally. No bony abnormality is seen. IMPRESSION: Mild bibasilar atelectasis. Electronically Signed   By: Inez Catalina M.D.   On: 05/28/2019 17:39     All images have been reviewed by me personally.  EKG: Pending.   Assessment/Plan Principal Problem:   Fall Active Problems:   Artificial cardiac pacemaker  Essential (primary) hypertension   Adult hypothyroidism   Complete heart block, post-surgical (HCC)   Bundle branch block, right   H/O aortic valve replacement   Diabetes mellitus, type 2 (HCC)   Tibia fracture   Left tibial fracture   Right lower  extremity pain secondary to right-sided tibial and fibular fracture, closed -Admit to hospital for surgical repair -N.p.o. past midnight.  Orthopedic consulted -Pain control, bowel regimen -Gentle hydration -Bedrest, postop PT/OT -Postop DVT prophylaxis per orthopedic  Still awaiting preop lab work, COVID-19, EKG.  History of high degree heart block status post pacemaker History of aortic valve replacement/failed TAVR -Follows Dr. Lady Gary outpatient.  Clinically does not have any cardiac complaints at this time.  Hypothyroidism -Resume home meds when confirmed by pharmacy  Essential hypertension -Pending preadmission med rec  Diabetes mellitus type 2 -Pending preadmission med rec -Insulin sliding scale and Accu-Chek    DVT prophylaxis: SCDs preop Code Status: Patient wishes to be full code Family Communication: None Disposition Plan: Inpatient admission Consults called:  orthopedic Admission status: Inpatient admission for surgical repair   Time Spent: 65 minutes.  >50% of the time was devoted to discussing the patients care, assessment, plan and disposition with other care givers along with counseling the patient about the risks and benefits of treatment.    Cade Dashner Joline Maxcy MD [facility]  If 7PM-7AM, please contact night-coverage   05/28/2019, 5:45 PM

## 2019-05-28 NOTE — ED Notes (Signed)
Attempted to call report x 1  

## 2019-05-29 ENCOUNTER — Encounter
Admission: EM | Disposition: A | Discharge status: Skilled Nursing Facility | Payer: Self-pay | Source: Home / Self Care | Attending: Internal Medicine

## 2019-05-29 ENCOUNTER — Inpatient Hospital Stay: Payer: Medicare Other | Admitting: Anesthesiology

## 2019-05-29 ENCOUNTER — Encounter: Payer: Self-pay | Admitting: Internal Medicine

## 2019-05-29 ENCOUNTER — Inpatient Hospital Stay: Payer: Medicare Other

## 2019-05-29 DIAGNOSIS — Z952 Presence of prosthetic heart valve: Secondary | ICD-10-CM

## 2019-05-29 DIAGNOSIS — S82302G Unspecified fracture of lower end of left tibia, subsequent encounter for closed fracture with delayed healing: Secondary | ICD-10-CM

## 2019-05-29 DIAGNOSIS — W19XXXD Unspecified fall, subsequent encounter: Secondary | ICD-10-CM

## 2019-05-29 DIAGNOSIS — I10 Essential (primary) hypertension: Secondary | ICD-10-CM

## 2019-05-29 HISTORY — PX: OPEN REDUCTION INTERNAL FIXATION (ORIF) TIBIA/FIBULA FRACTURE: SHX5992

## 2019-05-29 LAB — GLUCOSE, CAPILLARY
Glucose-Capillary: 118 mg/dL — ABNORMAL HIGH (ref 70–99)
Glucose-Capillary: 116 mg/dL — ABNORMAL HIGH (ref 70–99)
Glucose-Capillary: 87 mg/dL (ref 70–99)
Glucose-Capillary: 128 mg/dL — ABNORMAL HIGH (ref 70–99)

## 2019-05-29 LAB — T4, FREE: Free T4: 1.23 ng/dL — ABNORMAL HIGH (ref 0.61–1.12)

## 2019-05-29 LAB — BASIC METABOLIC PANEL
Anion gap: 6 (ref 5–15)
BUN: 13 mg/dL (ref 8–23)
CO2: 25 mmol/L (ref 22–32)
Calcium: 9.3 mg/dL (ref 8.9–10.3)
Chloride: 108 mmol/L (ref 98–111)
Creatinine, Ser: 0.74 mg/dL (ref 0.44–1.00)
GFR calc Af Amer: >60 mL/min (ref >60)
GFR calc non Af Amer: >60 mL/min (ref >60)
Glucose, Bld: 116 mg/dL — ABNORMAL HIGH (ref 70–99)
Potassium: 3.4 mmol/L — ABNORMAL LOW (ref 3.5–5.1)
Sodium: 139 mmol/L (ref 135–145)

## 2019-05-29 LAB — CBC
HCT: 36.2 % (ref 36.0–46.0)
Hemoglobin: 11.7 g/dL — ABNORMAL LOW (ref 12.0–15.0)
MCH: 32.3 pg (ref 26.0–34.0)
MCHC: 32.3 g/dL (ref 30.0–36.0)
MCV: 100 fL (ref 80.0–100.0)
Platelets: 175 10*3/uL (ref 150–400)
RBC: 3.62 MIL/uL — ABNORMAL LOW (ref 3.87–5.11)
RDW: 14.1 % (ref 11.5–15.5)
WBC: 7.1 10*3/uL (ref 4.0–10.5)
nRBC: 0 % (ref 0.0–0.2)

## 2019-05-29 LAB — VITAMIN D 25 HYDROXY (VIT D DEFICIENCY, FRACTURES): Vit D, 25-Hydroxy: 50.94 ng/mL (ref 30–100)

## 2019-05-29 LAB — MAGNESIUM: Magnesium: 1.8 mg/dL (ref 1.7–2.4)

## 2019-05-29 SURGERY — OPEN REDUCTION INTERNAL FIXATION (ORIF) TIBIA/FIBULA FRACTURE
Anesthesia: Spinal | Site: Leg Lower | Laterality: Right

## 2019-05-29 MED ORDER — MAGNESIUM SULFATE 2 GM/50ML IV SOLN
2.0000 g | Freq: Once | INTRAVENOUS | Status: AC
  Administered 2019-05-29: 2 g via INTRAVENOUS
  Filled 2019-05-29: qty 50

## 2019-05-29 MED ORDER — OXYCODONE HCL 5 MG PO TABS
5.0000 mg | ORAL_TABLET | ORAL | Status: DC | PRN
  Administered 2019-05-29 – 2019-06-02 (×3): 10 mg via ORAL
  Filled 2019-05-29 (×3): qty 2

## 2019-05-29 MED ORDER — BUPIVACAINE IN DEXTROSE 0.75-8.25 % IT SOLN
INTRATHECAL | Status: DC | PRN
  Administered 2019-05-29: 1.6 mL via INTRATHECAL

## 2019-05-29 MED ORDER — VITAMIN D (ERGOCALCIFEROL) 1.25 MG (50000 UNIT) PO CAPS
50000.0000 [IU] | ORAL_CAPSULE | ORAL | Status: DC
  Administered 2019-06-02: 50000 [IU] via ORAL
  Filled 2019-05-29: qty 1

## 2019-05-29 MED ORDER — ONDANSETRON HCL 4 MG PO TABS: 4.0000 mg | ORAL_TABLET | Freq: Four times a day (QID) | ORAL | Status: DC | PRN

## 2019-05-29 MED ORDER — PANTOPRAZOLE SODIUM 40 MG PO TBEC
40.0000 mg | DELAYED_RELEASE_TABLET | Freq: Every day | ORAL | Status: DC
  Administered 2019-05-30 – 2019-06-03 (×5): 40 mg via ORAL
  Filled 2019-05-29 (×5): qty 1

## 2019-05-29 MED ORDER — ACETAMINOPHEN 325 MG PO TABS: 325.0000 mg | ORAL_TABLET | ORAL | Status: DC | PRN

## 2019-05-29 MED ORDER — PROPOFOL 10 MG/ML IV BOLUS
INTRAVENOUS | Status: AC
  Filled 2019-05-29: qty 20

## 2019-05-29 MED ORDER — ENOXAPARIN SODIUM 40 MG/0.4ML ~~LOC~~ SOLN
40.0000 mg | SUBCUTANEOUS | Status: DC
  Administered 2019-05-30 – 2019-06-03 (×5): 40 mg via SUBCUTANEOUS
  Filled 2019-05-29 (×5): qty 0.4

## 2019-05-29 MED ORDER — TRAMADOL HCL 50 MG PO TABS
50.0000 mg | ORAL_TABLET | Freq: Four times a day (QID) | ORAL | Status: DC | PRN
  Administered 2019-05-30 – 2019-06-03 (×5): 50 mg via ORAL
  Filled 2019-05-29 (×6): qty 1

## 2019-05-29 MED ORDER — LACTATED RINGERS IV SOLN: INTRAVENOUS | Status: DC | PRN

## 2019-05-29 MED ORDER — POTASSIUM CHLORIDE 10 MEQ/100ML IV SOLN
10.0000 meq | INTRAVENOUS | Status: AC
  Administered 2019-05-29 (×4): 10 meq via INTRAVENOUS
  Filled 2019-05-29 (×3): qty 100

## 2019-05-29 MED ORDER — LIDOCAINE HCL (PF) 2 % IJ SOLN
INTRAMUSCULAR | Status: AC
  Filled 2019-05-29: qty 10

## 2019-05-29 MED ORDER — PROPOFOL 500 MG/50ML IV EMUL
INTRAVENOUS | Status: AC
  Filled 2019-05-29: qty 50

## 2019-05-29 MED ORDER — FLEET ENEMA 7-19 GM/118ML RE ENEM: 1.0000 | ENEMA | Freq: Once | RECTAL | Status: DC | PRN

## 2019-05-29 MED ORDER — FENTANYL CITRATE (PF) 100 MCG/2ML IJ SOLN
INTRAMUSCULAR | Status: DC | PRN
  Administered 2019-05-29: 50 ug via INTRAVENOUS

## 2019-05-29 MED ORDER — BISACODYL 10 MG RE SUPP: 10.0000 mg | Freq: Every day | RECTAL | Status: DC | PRN

## 2019-05-29 MED ORDER — CHLORHEXIDINE GLUCONATE CLOTH 2 % EX PADS
6.0000 | MEDICATED_PAD | Freq: Every day | CUTANEOUS | Status: DC
  Administered 2019-05-30 – 2019-05-31 (×2): 6 via TOPICAL

## 2019-05-29 MED ORDER — ACETAMINOPHEN 500 MG PO TABS
1000.0000 mg | ORAL_TABLET | Freq: Four times a day (QID) | ORAL | Status: AC
  Administered 2019-05-29 – 2019-05-30 (×3): 1000 mg via ORAL
  Filled 2019-05-29 (×3): qty 2

## 2019-05-29 MED ORDER — METOPROLOL SUCCINATE ER 50 MG PO TB24
50.0000 mg | ORAL_TABLET | Freq: Every day | ORAL | Status: DC
  Administered 2019-05-29 – 2019-06-03 (×6): 50 mg via ORAL
  Filled 2019-05-29 (×6): qty 1

## 2019-05-29 MED ORDER — DIPHENHYDRAMINE HCL 12.5 MG/5ML PO ELIX: 12.5000 mg | ORAL_SOLUTION | ORAL | Status: DC | PRN

## 2019-05-29 MED ORDER — ACETAMINOPHEN 160 MG/5ML PO SOLN
325.0000 mg | ORAL | Status: DC | PRN
  Filled 2019-05-29: qty 20.3

## 2019-05-29 MED ORDER — LEVOTHYROXINE SODIUM 88 MCG PO TABS
88.0000 ug | ORAL_TABLET | Freq: Every day | ORAL | Status: DC
  Administered 2019-05-31 – 2019-06-03 (×4): 88 ug via ORAL
  Filled 2019-05-29 (×7): qty 1

## 2019-05-29 MED ORDER — NEOMYCIN-POLYMYXIN B GU 40-200000 IR SOLN
Status: DC | PRN
  Administered 2019-05-29: 4 mL

## 2019-05-29 MED ORDER — HYDROCODONE-ACETAMINOPHEN 7.5-325 MG PO TABS: 1.0000 | ORAL_TABLET | Freq: Once | ORAL | Status: DC | PRN

## 2019-05-29 MED ORDER — OXYCODONE HCL 5 MG PO CAPS: 5.0000 mg | ORAL_CAPSULE | Freq: Two times a day (BID) | ORAL | Status: DC | PRN

## 2019-05-29 MED ORDER — FAMOTIDINE 20 MG PO TABS: 20.0000 mg | ORAL_TABLET | Freq: Two times a day (BID) | ORAL | Status: DC

## 2019-05-29 MED ORDER — DOCUSATE SODIUM 100 MG PO CAPS
100.0000 mg | ORAL_CAPSULE | Freq: Two times a day (BID) | ORAL | Status: DC
  Administered 2019-05-29 – 2019-06-03 (×10): 100 mg via ORAL
  Filled 2019-05-29 (×10): qty 1

## 2019-05-29 MED ORDER — METOCLOPRAMIDE HCL 10 MG PO TABS: 5.0000 mg | ORAL_TABLET | Freq: Three times a day (TID) | ORAL | Status: DC | PRN

## 2019-05-29 MED ORDER — FENTANYL CITRATE (PF) 100 MCG/2ML IJ SOLN
INTRAMUSCULAR | Status: AC
  Filled 2019-05-29: qty 2

## 2019-05-29 MED ORDER — ONDANSETRON HCL 4 MG/2ML IJ SOLN: 4.0000 mg | Freq: Four times a day (QID) | INTRAMUSCULAR | Status: DC | PRN

## 2019-05-29 MED ORDER — METOCLOPRAMIDE HCL 5 MG/ML IJ SOLN: 5.0000 mg | Freq: Three times a day (TID) | INTRAMUSCULAR | Status: DC | PRN

## 2019-05-29 MED ORDER — BUPIVACAINE HCL 0.5 % IJ SOLN
INTRAMUSCULAR | Status: DC | PRN
  Administered 2019-05-29: 30 mL

## 2019-05-29 MED ORDER — PROPOFOL 10 MG/ML IV BOLUS
INTRAVENOUS | Status: DC | PRN
  Administered 2019-05-29: 30 ug/kg/min via INTRAVENOUS
  Administered 2019-05-29: 20 mg via INTRAVENOUS

## 2019-05-29 MED ORDER — FENTANYL CITRATE (PF) 100 MCG/2ML IJ SOLN: 25.0000 ug | INTRAMUSCULAR | Status: DC | PRN

## 2019-05-29 MED ORDER — CEFAZOLIN SODIUM-DEXTROSE 2-4 GM/100ML-% IV SOLN
2.0000 g | Freq: Four times a day (QID) | INTRAVENOUS | Status: AC
  Administered 2019-05-29 – 2019-05-30 (×2): 2 g via INTRAVENOUS
  Filled 2019-05-29 (×3): qty 100

## 2019-05-29 MED ORDER — MAGNESIUM HYDROXIDE 400 MG/5ML PO SUSP
30.0000 mL | Freq: Every day | ORAL | Status: DC | PRN
  Administered 2019-05-30 – 2019-06-01 (×2): 30 mL via ORAL
  Filled 2019-05-29 (×2): qty 30

## 2019-05-29 MED ORDER — SODIUM CHLORIDE 0.9 % IV SOLN
INTRAVENOUS | Status: DC | PRN
  Administered 2019-05-29: 15:00:00 30 ug/min via INTRAVENOUS

## 2019-05-29 MED ORDER — ACETAMINOPHEN 325 MG PO TABS
325.0000 mg | ORAL_TABLET | Freq: Four times a day (QID) | ORAL | Status: DC | PRN
  Administered 2019-06-03: 650 mg via ORAL
  Filled 2019-05-29: qty 2

## 2019-05-29 MED ORDER — LIDOCAINE HCL (CARDIAC) PF 100 MG/5ML IV SOSY
PREFILLED_SYRINGE | INTRAVENOUS | Status: DC | PRN
  Administered 2019-05-29: 60 mg via INTRAVENOUS

## 2019-05-29 MED ORDER — CEFAZOLIN SODIUM 1 G IJ SOLR
INTRAMUSCULAR | Status: AC
  Filled 2019-05-29: qty 20

## 2019-05-29 MED ORDER — HYDROMORPHONE HCL 1 MG/ML IJ SOLN: 0.2500 mg | INTRAMUSCULAR | Status: DC | PRN

## 2019-05-29 MED ORDER — ONDANSETRON HCL 4 MG/2ML IJ SOLN: 4.0000 mg | Freq: Once | INTRAMUSCULAR | Status: DC | PRN

## 2019-05-29 SURGICAL SUPPLY — 49 items
BIT DRILL 2.5X2.75 QC CALB (BIT) ×3 IMPLANT
BIT DRILL CALIBRATED 2.7 (BIT) ×2 IMPLANT
BIT DRILL CALIBRATED 2.7MM (BIT) ×1
BLADE SURG SZ10 CARB STEEL (BLADE) ×6 IMPLANT
BNDG COHESIVE 4X5 TAN STRL (GAUZE/BANDAGES/DRESSINGS) ×3 IMPLANT
BNDG ELASTIC 6X5.8 VLCR NS LF (GAUZE/BANDAGES/DRESSINGS) ×3 IMPLANT
BNDG ESMARK 4X12 TAN STRL LF (GAUZE/BANDAGES/DRESSINGS) ×3 IMPLANT
BNDG PLASTER FAST 4X5 WHT LF (CAST SUPPLIES) ×6 IMPLANT
BNDG PLASTER FAST 6X5 WHT LF (CAST SUPPLIES) ×6 IMPLANT
CANISTER SUCT 1200ML W/VALVE (MISCELLANEOUS) ×3 IMPLANT
CHLORAPREP W/TINT 26 (MISCELLANEOUS) ×3 IMPLANT
COVER WAND RF STERILE (DRAPES) ×3 IMPLANT
DECANTER SPIKE VIAL GLASS SM (MISCELLANEOUS) ×3 IMPLANT
DRAPE C-ARM XRAY 36X54 (DRAPES) ×3 IMPLANT
DRAPE C-ARMOR (DRAPES) ×3 IMPLANT
DRAPE INCISE IOBAN 66X45 STRL (DRAPES) ×3 IMPLANT
ELECT REM PT RETURN 9FT ADLT (ELECTROSURGICAL) ×3
ELECTRODE REM PT RTRN 9FT ADLT (ELECTROSURGICAL) ×1 IMPLANT
GAUZE SPONGE 4X4 12PLY STRL (GAUZE/BANDAGES/DRESSINGS) ×3 IMPLANT
GAUZE XEROFORM 4X4 STRL (GAUZE/BANDAGES/DRESSINGS) ×3 IMPLANT
GLOVE BIO SURGEON STRL SZ7.5 (GLOVE) ×6 IMPLANT
GLOVE INDICATOR 8.0 STRL GRN (GLOVE) ×6 IMPLANT
GOWN STRL REUS W/ TWL LRG LVL3 (GOWN DISPOSABLE) ×1 IMPLANT
GOWN STRL REUS W/ TWL XL LVL3 (GOWN DISPOSABLE) ×1 IMPLANT
GOWN STRL REUS W/TWL LRG LVL3 (GOWN DISPOSABLE) ×2
GOWN STRL REUS W/TWL XL LVL3 (GOWN DISPOSABLE) ×2
K-WIRE ACE 1.6X6 (WIRE) ×6
KWIRE ACE 1.6X6 (WIRE) ×2 IMPLANT
NS IRRIG 1000ML POUR BTL (IV SOLUTION) ×3 IMPLANT
PACK EXTREMITY ARMC (MISCELLANEOUS) ×3 IMPLANT
PAD CAST CTTN 4X4 STRL (SOFTGOODS) ×2 IMPLANT
PADDING CAST COTTON 4X4 STRL (SOFTGOODS) ×4
PLATE 9H 184 RT MED DIST TIB (Plate) ×3 IMPLANT
SCREW CORT T15 TPR 55X3.5XST (Screw) ×1 IMPLANT
SCREW CORTICAL 3.5MM  30MM (Screw) ×2 IMPLANT
SCREW CORTICAL 3.5MM 30MM (Screw) ×1 IMPLANT
SCREW CORTICAL 3.5X55MM (Screw) ×2 IMPLANT
SCREW LOCK CORT STAR 3.5X26 (Screw) ×6 IMPLANT
SCREW LOCK CORT STAR 3.5X28 (Screw) ×3 IMPLANT
SCREW LOCK CORT STAR 3.5X34 (Screw) ×6 IMPLANT
SCREW LOCK CORT STAR 3.5X36 (Screw) ×3 IMPLANT
SCREW LOCK CORT STAR 3.5X38 (Screw) ×6 IMPLANT
SPONGE LAP 18X18 RF (DISPOSABLE) ×3 IMPLANT
STAPLER SKIN PROX 35W (STAPLE) ×3 IMPLANT
STOCKINETTE M/LG 89821 (MISCELLANEOUS) ×3 IMPLANT
SUT PROLENE 4 0 PS 2 18 (SUTURE) ×3 IMPLANT
SUT VIC AB 0 CT1 36 (SUTURE) ×3 IMPLANT
SUT VIC AB 1 CT1 36 (SUTURE) ×3 IMPLANT
TOWEL OR 17X26 4PK STRL BLUE (TOWEL DISPOSABLE) ×6 IMPLANT

## 2019-05-29 NOTE — Plan of Care (Signed)
  Problem: Coping: Goal: Level of anxiety will decrease 05/29/2019 0116 by Donnel Saxon, RN Outcome: Progressing 05/29/2019 0112 by Donnel Saxon, RN Outcome: Progressing   Problem: Elimination: Goal: Will not experience complications related to bowel motility 05/29/2019 0116 by Donnel Saxon, RN Outcome: Progressing 05/29/2019 0112 by Donnel Saxon, RN Outcome: Progressing Goal: Will not experience complications related to urinary retention 05/29/2019 0116 by Donnel Saxon, RN Outcome: Progressing 05/29/2019 0112 by Donnel Saxon, RN Outcome: Progressing   Problem: Pain Managment: Goal: General experience of comfort will improve 05/29/2019 0116 by Donnel Saxon, RN Outcome: Progressing 05/29/2019 0112 by Donnel Saxon, RN Outcome: Progressing   Problem: Safety: Goal: Ability to remain free from injury will improve 05/29/2019 0116 by Donnel Saxon, RN Outcome: Progressing 05/29/2019 0112 by Donnel Saxon, RN Outcome: Progressing   Problem: Skin Integrity: Goal: Risk for impaired skin integrity will decrease 05/29/2019 0116 by Donnel Saxon, RN Outcome: Progressing 05/29/2019 0112 by Donnel Saxon, RN Outcome: Progressing

## 2019-05-29 NOTE — Op Note (Signed)
05/29/2019  5:17 PM  Patient:   Zoe [name]  Pre-Op Diagnosis:   Closed displaced right distal tibia and fibular fractures.  Post-Op Diagnosis:   Same  Procedure:   Open reduction and internal fixation of right distal tibia fracture  Surgeon:   Pascal Lux, MD  Assistant:   None  Anesthesia:   Spinal  Findings:   As above.  Complications:   None  Fluids:   500 cc crystalloid  EBL:   15 cc  UOP:   None  TT:   75 minutes at 300 mmHg  Drains:   None  Closure:   Staples  Implants:   Biomet ALPS 9-hole precontoured distal tibial plate  Brief Clinical Note:   The patient is a 86 year old female who sustained the above-noted injury yesterday afternoon when she tripped and fell while taking some trash out to her garbage can.  She presented to the emergency room where x-rays demonstrated the above-noted injury.  The patient has been cleared medically and presents at this time for definitive management of this injury.  Procedure:   The patient was brought into the operating room.  After adequate spinal anesthesia was obtained, the patient's right foot and lower extremity were prepped with ChloraPrep solution before being draped sterilely.  Preoperative antibiotics were administered.  A timeout was performed to verify the appropriate surgical site before the limb was exsanguinated with an Esmarch and the thigh tourniquet inflated to 300 mmHg.  The fracture alignment was assessed fluoroscopically in AP and lateral projections and found to be near anatomic.  An approximately 5 to 6 cm incision was made over the medial aspect of the ankle at the level of the medial malleolus.  The incision was carried down through the subcutaneous tissues to expose the medial malleolar region with care taken to avoid damaging the saphenous vein and associated saphenous nerves.    After determining the appropriate length plate, the 9-hole Biomet ALPS precontoured distal tibial plate was selected  and inserted into the incision before it was advanced along the anteromedial face of the tibia.  Again, the adequacy of fracture alignment and plate position was verified fluoroscopically in AP and lateral projections.  After several minor adjustments of the plate, the plate was secured temporarily along the medial aspect of the tibia using K wires proximally and distally.  The plate was then formally secured to the tibia using seven locking screws distally and four bicortical screws proximally.  One of the screws was a bicortical nonlocking screw, while the other 3 screws were locking bicortical screws.  The proximal bicortical screws were placed through stab incisions.  Again, the adequacy of fracture alignment and hardware position was verified fluoroscopically in AP and lateral projections and found to be excellent.  The wounds were copiously irrigated with bacitracin saline solution using bulb irrigation.  The distal incision was closed using 3-0 Vicryl interrupted sutures for the subcutaneous tissues before staples were used to close the skin.  The more proximal stab incisions also were closed using 3-0 Vicryl interrupted sutures for the subcutaneous tissues before the skin was closed using staples.  A total of 30 cc of 0.5% plain Sensorcaine was injected in and around the incision sites to help with postoperative analgesia.  Given the patient's age and skin fragility, it was felt best to apply a Praveena negative pressure wound VAC over the incisions before a posterior splint with a sugar tong supplement was applied to the lower leg, maintaining the ankle  in neutral dorsiflexion.  Once the splint had hardened, the patient was awakened and returned to the recovery room in satisfactory condition after tolerating the procedure well.

## 2019-05-29 NOTE — Anesthesia Preprocedure Evaluation (Addendum)
Anesthesia Evaluation  Patient identified by MRN, date of birth, ID band Patient awake    Reviewed: Allergy & Precautions, H&P , NPO status , Patient's Chart, lab work & pertinent test results  Airway Mallampati: III  TM Distance: >3 FB Neck ROM: limited    Dental  (+) Upper Dentures, Missing, Chipped   Pulmonary neg pulmonary ROS, former smoker,    Pulmonary exam normal        Cardiovascular hypertension, Normal cardiovascular exam+ dysrhythmias (CHB s/p pacemaker placement) + pacemaker + Valvular Problems/Murmurs (s/p AVR) AS   09/2018 ECHO INTERPRETATION NORMAL LEFT VENTRICULAR SYSTOLIC FUNCTION  WITH MODERATE LVH NORMAL RIGHT VENTRICULAR SYSTOLIC FUNCTION MODERATE VALVULAR REGURGITATION (See above) NO VALVULAR STENOSIS Leaflets: BIOPROSTHETIC Closest EF: >55% (Estimated) LVH: MODERATE LVH AVS: BIOPROSTHETIC AoV Mitral: MODERATE MR Tricuspid: MODERATE TR   Neuro/Psych negative neurological ROS  negative psych ROS   GI/Hepatic negative GI ROS, Neg liver ROS, neg GERD  ,  Endo/Other  diabetesHypothyroidism   Renal/GU      Musculoskeletal  (+) Arthritis ,   Abdominal   Peds  Hematology negative hematology ROS (+)   Anesthesia Other Findings Past Medical History: No date: Arthritis No date: Diabetes mellitus without complication (HCC)  Past Surgical History: 2015: CARDIAC SURGERY, S/P Bio AVR   BMI    Body Mass Index: 29.04 kg/m      Reproductive/Obstetrics negative OB ROS                          Anesthesia Physical Anesthesia Plan  ASA: III and emergent  Anesthesia Plan: Spinal   Post-op Pain Management:    Induction: Intravenous  PONV Risk Score and Plan: 2 and TIVA and Treatment may vary due to age or medical condition  Airway Management Planned: Nasal Cannula and Natural Airway  Additional Equipment:   Intra-op Plan:   Post-operative Plan:   Informed  Consent: I have reviewed the patients History and Physical, chart, labs and discussed the procedure including the risks, benefits and alternatives for the proposed anesthesia with the patient or authorized representative who has indicated his/her understanding and acceptance.     Dental Advisory Given  Plan Discussed with: Anesthesiologist  Anesthesia Plan Comments: (Pt not on plavix according to pt and latest chart documentation.)      Anesthesia Quick Evaluation

## 2019-05-29 NOTE — Plan of Care (Signed)

## 2019-05-29 NOTE — Progress Notes (Signed)
OT Cancellation Note  Patient Details Name: SHARIYA GASTER MRN: [id number] DOB: 05-11-1927   Cancelled Treatment:    Reason Eval/Treat Not Completed: Medical issues which prohibited therapy(Pt. is preparaing for anticipated surgical intervention today. Will perform the initial eval when appropriate.)  Olegario Messier, MS, OTR/L 05/29/2019, 10:46 AM

## 2019-05-29 NOTE — Anesthesia Procedure Notes (Signed)
Spinal  Patient location during procedure: OR Start time: 05/29/2019 3:12 PM End time: 05/29/2019 3:17 PM Staffing Performed: anesthesiologist  Anesthesiologist: Christia Reading, MD Preanesthetic Checklist Completed: patient identified, IV checked, site marked, risks and benefits discussed, surgical consent, monitors and equipment checked, pre-op evaluation and timeout performed Spinal Block Patient position: right lateral decubitus Prep: DuraPrep Patient monitoring: heart rate, cardiac monitor, continuous pulse ox and blood pressure Approach: right paramedian Location: L3-4 Injection technique: single-shot Needle Needle type: Sprotte  Needle gauge: 24 G Needle length: 9 cm Needle insertion depth: 6 cm Assessment Sensory level: T4 Additional Notes O2, mons on. Sterile prep, 2nd pass clear CSF. Easy aspir, tol well. 1.8 ml 0.755 hyperbaric bupiv

## 2019-05-29 NOTE — Progress Notes (Signed)
PT Cancellation Note  Patient Details Name: SHADIE SWEATMAN MRN: [id number] DOB: 16-Oct-1926   Cancelled Treatment:    Reason Eval/Treat Not Completed: Patient not medically ready;Other (comment);Active bedrest order(Patient consult received and reviewed. Upon speaking with nursing patient to have surgery this afternoon and is bedrest until then. Will continue to monitor patient and will attempt again as appropriate.)  Precious Bard, PT, DPT   05/29/2019, 9:55 AM

## 2019-05-29 NOTE — Progress Notes (Signed)
PROGRESS NOTE    Zoe [name]  ZOX:[id number] DOB: July 13, 1926 DOA: 05/28/2019 PCP: Patient, No Pcp Per   Brief Narrative:  86 y.o. female with medical history significant of high degree heart block status post pacemaker, aortic stenosis status post failed TAVR, diabetes mellitus type 2, HTN, hypothyroidism, osteoarthritis presented to the hospital after sustaining a fall with complains of right leg pain.  Found to have tibial fibula fracture.  Orthopedic recommending surgical intervention.   Assessment & Plan:   Principal Problem:   Fall Active Problems:   Artificial cardiac pacemaker   Essential (primary) hypertension   Adult hypothyroidism   Complete heart block, post-surgical (HCC)   Bundle branch block, right   H/O aortic valve replacement   Diabetes mellitus, type 2 (HCC)   Tibia fracture   Left tibial fracture  Right lower extremity pain secondary to right-sided tibial and fibular fracture, closed -N.p.o. past midnight.  Orthopedic consulted-plans for surgical repair today -Pain control, bowel regimen -Gentle hydration -Bedrest, postop PT/OT -Postop DVT prophylaxis per orthopedic  History of high degree heart block status post pacemaker History of aortic valve replacement/failed TAVR -Follows Dr. Lady Gary outpatient.  Clinically does not have any cardiac complaints at this time.  Hypothyroidism -Resume home meds  Essential hypertension -Resume home meds  Diabetes mellitus type 2 -Diabetic diet.  -Insulin sliding scale and Accu-Chek     DVT prophylaxis: Postop per surgery Code Status: Full code Family Communication: None Disposition Plan: Maintain hospital stay for surgical intervention  Consultants:   Ortho  Procedures:   None  Antimicrobials:   Perioperative   Subjective: Feels much better this morning denies any complaints.  Review of Systems Otherwise negative except as per HPI, including: General = no fevers, chills, dizziness,  malaise, fatigue HEENT/EYES = negative for pain, redness, loss of vision, double vision, blurred vision, loss of hearing, sore throat, hoarseness, dysphagia Cardiovascular= negative for chest pain, palpitation, murmurs, lower extremity swelling Respiratory/lungs= negative for shortness of breath, cough, hemoptysis, wheezing, mucus production Gastrointestinal= negative for nausea, vomiting,, abdominal pain, melena, hematemesis Genitourinary= negative for Dysuria, Hematuria, Change in Urinary Frequency MSK = Negative for arthralgia, myalgias, Back Pain, Joint swelling  Neurology= Negative for headache, seizures, numbness, tingling  Psychiatry= Negative for anxiety, depression, suicidal and homocidal ideation Allergy/Immunology= Medication/Food allergy as listed  Skin= Negative for Rash, lesions, ulcers, itching   Objective: Vitals:   05/28/19 2030 05/28/19 2134 05/29/19 0055 05/29/19 0757  BP: (!) 163/88 (!) 147/86 (!) 153/68 (!) 143/81  Pulse: 82 82 81 81  Resp: 17 16 16 17   Temp:  98.4 F (36.9 C) 98.5 F (36.9 C) 98.3 F (36.8 C)  TempSrc:  Oral Oral Oral  SpO2: 97% 98% 100% 98%  Weight:   86.6 kg   Height:        Intake/Output Summary (Last 24 hours) at 05/29/2019 1326 Last data filed at 05/29/2019 0935 Gross per 24 hour  Intake 765.97 ml  Output --  Net 765.97 ml   Filed Weights   05/28/19 1551 05/29/19 0055  Weight: 90.7 kg 86.6 kg    Examination:  Constitutional: Not in acute distress Respiratory: Clear to auscultation bilaterally Cardiovascular: Normal sinus rhythm, no rubs Abdomen: Nontender nondistended good bowel sounds Musculoskeletal: Limited range of motion left lower extremity Skin: No rashes seen Neurologic: CN 2-12 grossly intact.  And nonfocal Psychiatric: Normal judgment and insight. Alert and oriented x 3. Normal mood.       Data Reviewed:   CBC: Recent Labs  Lab 05/28/19 1745 05/29/19 0545  WBC 7.5 7.1  NEUTROABS 5.5  --   HGB 12.3 11.7*   HCT 38.6 36.2  MCV 101.8* 100.0  PLT 192 725   Basic Metabolic Panel: Recent Labs  Lab 05/28/19 1745 05/29/19 0545  NA 141 139  K 3.9 3.4*  CL 109 108  CO2 26 25  GLUCOSE 123* 116*  BUN 15 13  CREATININE 0.88 0.74  CALCIUM 9.5 9.3  MG  --  1.8   GFR: Estimated Creatinine Clearance: 53.9 mL/min (by C-G formula based on SCr of 0.74 mg/dL). Liver Function Tests: No results for input(s): AST, ALT, ALKPHOS, BILITOT, PROT, ALBUMIN in the last 168 hours. No results for input(s): LIPASE, AMYLASE in the last 168 hours. No results for input(s): AMMONIA in the last 168 hours. Coagulation Profile: Recent Labs  Lab 05/28/19 1745  INR 1.0   Cardiac Enzymes: No results for input(s): CKTOTAL, CKMB, CKMBINDEX, TROPONINI in the last 168 hours. BNP (last 3 results) No results for input(s): PROBNP in the last 8760 hours. HbA1C: Recent Labs    05/28/19 1746  HGBA1C 6.6*   CBG: Recent Labs  Lab 05/28/19 2148 05/29/19 0756 05/29/19 1151  GLUCAP 99 118* 116*   Lipid Profile: No results for input(s): CHOL, HDL, LDLCALC, TRIG, CHOLHDL, LDLDIRECT in the last 72 hours. Thyroid Function Tests: Recent Labs    05/28/19 1746 05/29/19 0545  TSH 0.133*  --   FREET4  --  1.23*   Anemia Panel: No results for input(s): VITAMINB12, FOLATE, FERRITIN, TIBC, IRON, RETICCTPCT in the last 72 hours. Sepsis Labs: No results for input(s): PROCALCITON, LATICACIDVEN in the last 168 hours.  Recent Results (from the past 240 hour(s))  SARS CORONAVIRUS 2 (TAT 6-24 HRS) Nasopharyngeal Nasopharyngeal Swab     Status: None   Collection Time: 05/28/19  5:46 PM   Specimen: Nasopharyngeal Swab  Result Value Ref Range Status   SARS Coronavirus 2 NEGATIVE NEGATIVE Final    Comment: (NOTE) SARS-CoV-2 target nucleic acids are NOT DETECTED. The SARS-CoV-2 RNA is generally detectable in upper and lower respiratory specimens during the acute phase of infection. Negative results do not preclude SARS-CoV-2  infection, do not rule out co-infections with other pathogens, and should not be used as the sole basis for treatment or other patient management decisions. Negative results must be combined with clinical observations, patient history, and epidemiological information. The expected result is Negative. Fact Sheet for Patients: SugarRoll.be Fact Sheet for Healthcare Providers: https://www.woods-mathews.com/ This test is not yet approved or cleared by the [place] FDA and  has been authorized for detection and/or diagnosis of SARS-CoV-2 by FDA under an Emergency Use Authorization (EUA). This EUA will remain  in effect (meaning this test can be used) for the duration of the COVID-19 declaration under Section 56 4(b)(1) of the Act, 21 U.S.C. section 360bbb-3(b)(1), unless the authorization is terminated or revoked sooner. Performed at [facility], [place] [address], [place], [place] [address]          Radiology Studies: DG Tibia/Fibula Right  Result Date: 05/28/2019 CLINICAL DATA:  Post reduction, fracture EXAM: RIGHT TIBIA AND FIBULA - 2 VIEW COMPARISON:  05/28/2019 FINDINGS: Acute comminuted fracture involving proximal shaft of the fibula with residual 1/2 shaft diameter anterior and less than 1/4 shaft diameter lateral displacement of distal fracture fragment, decreased compared to prior. Acute comminuted fracture involving the distal shaft and metaphysis of the tibia with residual 1/2 shaft diameter lateral and posterior displacement of  distal fracture fragment, also decreased compared to prior on the lateral view. 12 mm anteriorly displaced fracture fragment. Slight posterior angulation of the distal tibial fracture fragment. IMPRESSION: 1. Acute comminuted fracture involving proximal shaft of fibula with residual displacement but decreased compared to prior 2. Acute comminuted, displaced and slightly angulated distal tibial fracture, with  decreased displacement on the lateral view. There may be slight increased lateral displacement on frontal view but comparison limited by rotated positioning on the previous exam Electronically Signed   By: Jasmine Pang M.D.   On: 05/28/2019 19:07   DG Tibia/Fibula Right  Result Date: 05/28/2019 CLINICAL DATA:  Recent fall with right leg pain EXAM: RIGHT TIBIA AND FIBULA - 2 VIEW COMPARISON:  03/17/2008 FINDINGS: Oblique fracture is noted through the proximal fibular shaft. Additionally an oblique comminuted fracture through the distal tibia involving the metaphysis is seen. Some rotational component laterally is noted in the distal lower leg. Changes consistent with prior knee replacement are again seen. IMPRESSION: Proximal fibular and distal tibial fracture with some rotational component laterally. Electronically Signed   By: Alcide Clever M.D.   On: 05/28/2019 17:02   DG Chest Portable 1 View  Result Date: 05/28/2019 CLINICAL DATA:  Recent fall, initial encounter EXAM: PORTABLE CHEST 1 VIEW COMPARISON:  03/16/2008 FINDINGS: Cardiac shadow is within normal limits. Postsurgical changes are seen. Pacing device is noted. Elevation of the right hemidiaphragm is again seen. Mild basilar atelectasis is noted bilaterally. No bony abnormality is seen. IMPRESSION: Mild bibasilar atelectasis. Electronically Signed   By: Alcide Clever M.D.   On: 05/28/2019 17:39        Scheduled Meds: . famotidine  20 mg Oral BID  . insulin aspart  0-15 Units Subcutaneous TID WC  . insulin aspart  0-5 Units Subcutaneous QHS  . insulin aspart  3 Units Subcutaneous TID WC  . levothyroxine  88 mcg Oral QAC breakfast  . metoprolol succinate  50 mg Oral Daily  . [START ON 06/02/2019] Vitamin D (Ergocalciferol)  50,000 Units Oral Q Wed   Continuous Infusions: . sodium chloride 75 mL/hr at 05/28/19 2308  .  ceFAZolin (ANCEF) IV    . potassium chloride 10 mEq (05/29/19 1209)     LOS: 1 day   Time spent= 35  mins    Maycol Hoying Joline Maxcy, MD [facility]  If 7PM-7AM, please contact night-coverage  05/29/2019, 1:26 PM

## 2019-05-29 NOTE — Transfer of Care (Signed)
Immediate Anesthesia Transfer of Care Note  Patient: Zoe [name]  Procedure(s) Performed: OPEN REDUCTION INTERNAL FIXATION (ORIF) TIBIA/FIBULA FRACTURE (Right Leg Lower)  Patient Location: PACU  Anesthesia Type:Spinal  Level of Consciousness: drowsy  Airway & Oxygen Therapy: Patient Spontanous Breathing  Post-op Assessment: Report given to RN  Post vital signs: stable  Last Vitals:  Vitals Value Taken Time  BP    Temp    Pulse    Resp    SpO2      Last Pain:  Vitals:   05/29/19 0935  TempSrc:   PainSc: 2          Complications: No apparent anesthesia complications

## 2019-05-29 NOTE — Progress Notes (Signed)
Patient in lowbed, IVF infusing. Pain controlled. Patient NPO for surgery this afternoon, patient aware and consent signed. No complaints at this time. Bed low, alarm on, call bell in reach.

## 2019-05-29 NOTE — Consult Note (Signed)
ORTHOPAEDIC CONSULTATION  REQUESTING PHYSICIAN: Damita Lack, MD  Chief Complaint:   Right leg pain.  History of Present Illness: Zoe [name] is a 86 y.o. female with a history of adult-onset diabetes and arthritis but otherwise in excellent health who lives independently and ambulates without any assistive devices.  The patient was in her usual state of health until yesterday afternoon when she apparently slipped and fell while bringing some garbage out to the trash bin, injuring her right lower leg.  She was brought to the emergency room where x-rays demonstrated a closed displaced right tib-fib fracture.  The patient denies any associated injuries.  She did not strike her head or lose consciousness.  The patient also denies any lightheadedness, dizziness, chest pain, shortness of breath, or other symptoms which may have precipitated her fall.  Past Medical History:  Diagnosis Date  . Arthritis   . Diabetes mellitus without complication [facility]    Past Surgical History:  Procedure Laterality Date  . CARDIAC SURGERY  2015   Social History   Socioeconomic History  . Marital status: Married    Spouse name: Not on file  . Number of children: Not on file  . Years of education: Not on file  . Highest education level: Not on file  Occupational History  . Not on file  Tobacco Use  . Smoking status: Former Smoker    Packs/day: 0.25    Years: 5.00    Pack years: 1.25    Types: Cigarettes    Quit date: 05/22/1968    Years since quitting: 51.0  . Smokeless tobacco: Never Used  Substance and Sexual Activity  . Alcohol use: Not on file  . Drug use: Not on file  . Sexual activity: Not on file  Other Topics Concern  . Not on file  Social History Narrative  . Not on file   Social Determinants of Health   Financial Resource Strain:   . Difficulty of Paying Living Expenses: Not on file  Food Insecurity:   .  Worried About Charity fundraiser in the Last Year: Not on file  . Ran Out of Food in the Last Year: Not on file  Transportation Needs:   . Lack of Transportation (Medical): Not on file  . Lack of Transportation (Non-Medical): Not on file  Physical Activity:   . Days of Exercise per Week: Not on file  . Minutes of Exercise per Session: Not on file  Stress:   . Feeling of Stress : Not on file  Social Connections:   . Frequency of Communication with Friends and Family: Not on file  . Frequency of Social Gatherings with Friends and Family: Not on file  . Attends Religious Services: Not on file  . Active Member of Clubs or Organizations: Not on file  . Attends Archivist Meetings: Not on file  . Marital Status: Not on file   History reviewed. No pertinent family history. No Known Allergies Prior to Admission medications   Medication Sig Start Date End Date Taking? Authorizing Provider  acetaminophen (TYLENOL) 325 MG tablet Take 650 mg by mouth every 6 (six) hours as needed for mild pain or headache.    Yes [provider]  famotidine (PEPCID) 20 MG tablet Take 20 mg by mouth 2 (two) times daily. 04/20/19  Yes [provider]  Glucosamine-Chondroitin (OSTEO BI-FLEX REGULAR STRENGTH) 250-200 MG TABS Take 2 tablets by mouth daily.   Yes [provider]  levothyroxine (SYNTHROID, [place])  88 MCG tablet Take 88 mcg by mouth daily before breakfast. TAKE 1 TABLET DAILY ON AN EMPTY STOMACH WITH A GLASS OF WATER AT LEAST 30 TO 60 MINUTES BEFORE BREAKFAST 08/10/14  Yes [provider]  metoprolol succinate (TOPROL-XL) 50 MG 24 hr tablet Take 50 mg by mouth daily. Take with or immediately following a meal.   Yes [provider]  omega-3 acid ethyl esters (LOVAZA) 1 g capsule Take 1 g by mouth daily.   Yes [provider]  oxycodone (OXY-IR) 5 MG capsule Take 5 mg by mouth 2 (two) times daily as needed (severe pain).   Yes [provider]  pioglitazone (ACTOS) 15 MG tablet Take 15 mg by mouth daily.  03/28/14  Yes [provider]  Vitamin D, Ergocalciferol, (DRISDOL) 1.25 MG (50000 UT) CAPS capsule Take 50,000 Units by mouth every Wednesday.  04/20/19  Yes [provider]   DG Tibia/Fibula Right  Result Date: 05/28/2019 CLINICAL DATA:  Post reduction, fracture EXAM: RIGHT TIBIA AND FIBULA - 2 VIEW COMPARISON:  05/28/2019 FINDINGS: Acute comminuted fracture involving proximal shaft of the fibula with residual 1/2 shaft diameter anterior and less than 1/4 shaft diameter lateral displacement of distal fracture fragment, decreased compared to prior. Acute comminuted fracture involving the distal shaft and metaphysis of the tibia with residual 1/2 shaft diameter lateral and posterior displacement of distal fracture fragment, also decreased compared to prior on the lateral view. 12 mm anteriorly displaced fracture fragment. Slight posterior angulation of the distal tibial fracture fragment. IMPRESSION: 1. Acute comminuted fracture involving proximal shaft of fibula with residual displacement but decreased compared to prior 2. Acute comminuted, displaced and slightly angulated distal tibial fracture, with decreased displacement on the lateral view. There may be slight increased lateral displacement on frontal view but comparison limited by rotated positioning on the previous exam Electronically Signed   By: Jasmine Pang M.D.   On: 05/28/2019 19:07   DG Tibia/Fibula Right  Result Date: 05/28/2019 CLINICAL DATA:  Recent fall with right leg pain EXAM: RIGHT TIBIA AND FIBULA - 2 VIEW COMPARISON:  03/17/2008 FINDINGS: Oblique fracture is noted through the proximal fibular shaft. Additionally an oblique comminuted fracture through the distal tibia involving the metaphysis is seen. Some rotational component laterally is noted in the distal lower leg. Changes consistent with prior knee replacement are again seen. IMPRESSION:  Proximal fibular and distal tibial fracture with some rotational component laterally. Electronically Signed   By: Alcide Clever M.D.   On: 05/28/2019 17:02   DG Chest Portable 1 View  Result Date: 05/28/2019 CLINICAL DATA:  Recent fall, initial encounter EXAM: PORTABLE CHEST 1 VIEW COMPARISON:  03/16/2008 FINDINGS: Cardiac shadow is within normal limits. Postsurgical changes are seen. Pacing device is noted. Elevation of the right hemidiaphragm is again seen. Mild basilar atelectasis is noted bilaterally. No bony abnormality is seen. IMPRESSION: Mild bibasilar atelectasis. Electronically Signed   By: Alcide Clever M.D.   On: 05/28/2019 17:39    Positive ROS: All other systems have been reviewed and were otherwise negative with the exception of those mentioned in the HPI and as above.  Physical Exam: General:  Alert, no acute distress Psychiatric:  Patient is competent for consent with normal mood and affect   Cardiovascular:  No pedal edema Respiratory:  No wheezing, non-labored breathing GI:  Abdomen is soft and non-tender Skin:  No lesions in the area of chief complaint Neurologic:  Sensation intact distally Lymphatic:  No axillary or cervical  lymphadenopathy  Orthopedic Exam:  Orthopedic examination is limited to the right lower leg and foot.  The patient is in a posterior splint which appears to be in good condition.  The skin is intact at the proximal and distal margins of the splint with a well-healed prior surgical incision over the anterior aspect of the right knee.  She is able dorsiflex and plantarflex her toes.  Sensation is intact light touch to all digits as well as to the first webspace.  She has good capillary refill to all digits.  X-rays:  X-rays of the right tibia and fibula are available for review and have been reviewed by myself.  These films demonstrate a comminuted displaced distal tibial shaft fracture with a proximal fibular shaft fracture.  No significant degenerative  changes of the right ankle are noted.  The right total knee replacement components appear to be in good position and without evidence of loosening.  Assessment: Closed displaced right tib-fib fracture.  Plan: The treatment options have been discussed with the patient, including both surgical and nonsurgical choices.  The patient like to proceed with surgical intervention to include an open reduction and internal fixation of the right distal tibial fracture.  This procedure has been discussed in detail, as have the potential risks (including bleeding, infection, nerve and/or blood vessel injury, persistent or recurrent pain, stiffness, malunion or nonunion, leg length inequality, need for further surgery, blood clots, strokes, heart attacks and/or arrhythmias, etc.) and benefits.  The patient states her understanding and wishes to proceed.  A formal written consent will be obtained by the nursing staff.  Thank you for asking me to participate in the care of this most pleasant yet unfortunate woman.  I will be happy to follow her with you.   Maryagnes Amos, MD  Beeper #:  [phone]  05/29/2019 8:40 AM

## 2019-05-30 DIAGNOSIS — S82191G Other fracture of upper end of right tibia, subsequent encounter for closed fracture with delayed healing: Secondary | ICD-10-CM

## 2019-05-30 LAB — CBC
HCT: 35.5 % — ABNORMAL LOW (ref 36.0–46.0)
Hemoglobin: 11.5 g/dL — ABNORMAL LOW (ref 12.0–15.0)
MCH: 32.3 pg (ref 26.0–34.0)
MCHC: 32.4 g/dL (ref 30.0–36.0)
MCV: 99.7 fL (ref 80.0–100.0)
Platelets: 174 10*3/uL (ref 150–400)
RBC: 3.56 MIL/uL — ABNORMAL LOW (ref 3.87–5.11)
RDW: 14.1 % (ref 11.5–15.5)
WBC: 7.3 10*3/uL (ref 4.0–10.5)
nRBC: 0 % (ref 0.0–0.2)

## 2019-05-30 LAB — BASIC METABOLIC PANEL
Anion gap: 10 (ref 5–15)
BUN: 10 mg/dL (ref 8–23)
CO2: 23 mmol/L (ref 22–32)
Calcium: 9.3 mg/dL (ref 8.9–10.3)
Chloride: 107 mmol/L (ref 98–111)
Creatinine, Ser: 0.66 mg/dL (ref 0.44–1.00)
GFR calc Af Amer: >60 mL/min (ref >60)
GFR calc non Af Amer: >60 mL/min (ref >60)
Glucose, Bld: 119 mg/dL — ABNORMAL HIGH (ref 70–99)
Potassium: 3.8 mmol/L (ref 3.5–5.1)
Sodium: 140 mmol/L (ref 135–145)

## 2019-05-30 LAB — GLUCOSE, CAPILLARY
Glucose-Capillary: 119 mg/dL — ABNORMAL HIGH (ref 70–99)
Glucose-Capillary: 156 mg/dL — ABNORMAL HIGH (ref 70–99)
Glucose-Capillary: 106 mg/dL — ABNORMAL HIGH (ref 70–99)
Glucose-Capillary: 134 mg/dL — ABNORMAL HIGH (ref 70–99)

## 2019-05-30 LAB — MAGNESIUM: Magnesium: 2.1 mg/dL (ref 1.7–2.4)

## 2019-05-30 NOTE — Evaluation (Signed)
Physical Therapy Evaluation Patient Details Name: Zoe [name] MRN: [id number] DOB: [birth year] Today's Date: 05/30/2019   History of Present Illness  Pt admitted for fall while carrying garbage sustaining proximal fibular fx and distal tibial fx on R LE now s/p ORIF on 05/29/19. History includes DM and arthritis.  Clinical Impression  Pt is a pleasant 86 year old female who was admitted for fall and now s/p R LE ORIF. Pt previously very indep with ADLs. Pt performs bed mobility with min A, transfers with mod A, and ambulation with min assist and RW. Needs several cues to maintain WBing as she "rest" her foot on ground during ambulation however reports she isn't putting weight through. Would benefit from possible +2 for further assessment. Has difficulty with mobility without assist at this time although is very motivated to participate and improve in function. Pt demonstrates deficits with strength/mobility. Reports no pain at this time and is hopeful for home discharge. At this time pt isn't safe to dc home alone and is currently not at baseline level.  Would benefit from skilled PT to address above deficits and promote optimal return to PLOF; recommend transition to STR upon discharge from acute hospitalization.     Follow Up Recommendations SNF    Equipment Recommendations  Rolling walker with 5" wheels    Recommendations for Other Services       Precautions / Restrictions Precautions Precautions: Fall Restrictions Weight Bearing Restrictions: Yes RLE Weight Bearing: Non weight bearing      Mobility  Bed Mobility Overal bed mobility: Needs Assistance Bed Mobility: Supine to Sit     Supine to sit: Min assist     General bed mobility comments: needs assist for B LE management. Once seated at EOB, able to maintain upright posture.  Transfers Overall transfer level: Needs assistance Equipment used: Rolling walker (2 wheeled) Transfers: Sit to/from Stand Sit to Stand: Mod  assist         General transfer comment: Cues for correct hand placement. Once standing pt places foot on floor despite cues however reports she isn't placing weight through. Able to lift off floor briefly.  Slow transfer  Ambulation/Gait Ambulation/Gait assistance: Min assist Gait Distance (Feet): 3 Feet Assistive device: Rolling walker (2 wheeled) Gait Pattern/deviations: Step-to pattern     General Gait Details: cues for sequencing of RW. Fatigues quickly and unable to completely hop on L foot. Decreased foot clearance.   Stairs            Wheelchair Mobility    Modified Rankin (Stroke Patients Only)       Balance Overall balance assessment: Needs assistance Sitting-balance support: Feet supported Sitting balance-Leahy Scale: Good     Standing balance support: Bilateral upper extremity supported Standing balance-Leahy Scale: Fair                               Pertinent Vitals/Pain Pain Assessment: No/denies pain    Home Living Family/patient expects to be discharged to:: Private residence Living Arrangements: Children(son, however he is currently recovering in SNF, she is alone) Available Help at Discharge: Family Type of Home: House Home Access: Stairs to enter Entrance Stairs-Rails: Can reach both(vs 2 without railing) Entrance Stairs-Number of Steps: 4 Home Layout: One level Home Equipment: None      Prior Function Level of Independence: Independent         Comments: very active and indep with ADLs  Hand Dominance        Extremity/Trunk Assessment   Upper Extremity Assessment Upper Extremity Assessment: Overall WFL for tasks assessed    Lower Extremity Assessment Lower Extremity Assessment: Generalized weakness(R LE grossly 3/5; L LE grossly 4/5)       Communication   Communication: No difficulties  Cognition Arousal/Alertness: Awake/alert Behavior During Therapy: WFL for tasks assessed/performed Overall  Cognitive Status: Within Functional Limits for tasks assessed                                        General Comments      Exercises Other Exercises Other Exercises: supine ther-ex performed on B LE including quad sets, glut sets, hip abd/add, and SLRs. Needs min assist on R LE and cga on L LE. 10 reps with safe technique. Other Exercises: Ambulated to [facility] with min assist. Needs cues for sequencing and to maintain WBing. Pt stands to perform hygiene and needs hands on assist to maintain balance and WB   Assessment/Plan    PT Assessment Patient needs continued PT services  PT Problem List Decreased strength;Decreased balance;Decreased activity tolerance;Decreased mobility;Pain       PT Treatment Interventions DME instruction;Gait training;Therapeutic exercise;Balance training    PT Goals (Current goals can be found in the Care Plan section)  Acute Rehab PT Goals Patient Stated Goal: she really wants to go home PT Goal Formulation: With patient Time For Goal Achievement: 06/13/19 Potential to Achieve Goals: Good    Frequency BID   Barriers to discharge Decreased caregiver support;Inaccessible home environment      Co-evaluation               AM-PAC PT "6 Clicks" Mobility  Outcome Measure Help needed turning from your back to your side while in a flat bed without using bedrails?: A Little Help needed moving from lying on your back to sitting on the side of a flat bed without using bedrails?: A Lot Help needed moving to and from a bed to a chair (including a wheelchair)?: A Little Help needed standing up from a chair using your arms (e.g., wheelchair or bedside chair)?: A Little Help needed to walk in hospital room?: A Lot Help needed climbing 3-5 steps with a railing? : Total 6 Click Score: 14    End of Session Equipment Utilized During Treatment: Gait belt Activity Tolerance: Patient tolerated treatment well Patient left: in chair;with chair alarm  set;with SCD's reapplied Nurse Communication: Mobility status PT Visit Diagnosis: Muscle weakness (generalized) (M62.81);Difficulty in walking, not elsewhere classified (R26.2);Pain;Unsteadiness on feet (R26.81);History of falling (Z91.81) Pain - Right/Left: Right Pain - part of body: Ankle and joints of foot    Time: 1315-1350 PT Time Calculation (min) (ACUTE ONLY): 35 min   Charges:   PT Evaluation $PT Eval Low Complexity: 1 Low PT Treatments $Therapeutic Exercise: 8-22 mins $Therapeutic Activity: 8-22 mins        Elizabeth Palau, PT, DPT [phone]   Cathryne Mancebo 05/30/2019, 4:02 PM

## 2019-05-30 NOTE — Progress Notes (Signed)
Removed indwelling foley catheter at 6am.

## 2019-05-30 NOTE — Anesthesia Postprocedure Evaluation (Signed)
Anesthesia Post Note  Patient: Zoe [name]  Procedure(s) Performed: OPEN REDUCTION INTERNAL FIXATION (ORIF) TIBIA/FIBULA FRACTURE (Right Leg Lower)  Patient location during evaluation: PACU Anesthesia Type: Spinal Level of consciousness: awake and alert Pain management: pain level controlled Vital Signs Assessment: post-procedure vital signs reviewed and stable Respiratory status: spontaneous breathing, nonlabored ventilation and respiratory function stable Cardiovascular status: blood pressure returned to baseline and stable Postop Assessment: no apparent nausea or vomiting and spinal receding Anesthetic complications: no     Last Vitals:  Vitals:   05/29/19 2358 05/30/19 0512  BP: (!) 150/74 (!) 157/74  Pulse: 80 79  Resp:  17  Temp:  37.3 C  SpO2: 97% 98%    Last Pain:  Vitals:   05/29/19 2345  TempSrc: Oral  PainSc: 0-No pain                 Christia Reading

## 2019-05-30 NOTE — Progress Notes (Signed)
   Subjective: 1 Day Post-Op Procedure(s) (LRB): OPEN REDUCTION INTERNAL FIXATION (ORIF) TIBIA/FIBULA FRACTURE (Right) Patient reports pain as 0 on 0-10 scale.   Patient is well, and has had no acute complaints or problems Denies any CP, SOB, ABD pain. We will start physical therapy today.   Objective: Vital signs in last 24 hours: Temp:  [96.9 F (36.1 C)-100.5 F (38.1 C)] 98.2 F (36.8 C) (01/10 0755) Pulse Rate:  [79-87] 80 (01/10 0755) Resp:  [14-20] 17 (01/10 0755) BP: (102-173)/(64-94) 158/78 (01/10 0755) SpO2:  [93 %-100 %] 98 % (01/10 0755)  Intake/Output from previous day: 01/09 0701 - 01/10 0700 In: 1416 [I.V.:1233; IV Piggyback:183] Out: 1940 [Urine:1925; Blood:15] Intake/Output this shift: Total I/O In: 100 [IV Piggyback:100] Out: -   Recent Labs    05/28/19 1745 05/29/19 0545 05/30/19 0515  HGB 12.3 11.7* 11.5*   Recent Labs    05/29/19 0545 05/30/19 0515  WBC 7.1 7.3  RBC 3.62* 3.56*  HCT 36.2 35.5*  PLT 175 174   Recent Labs    05/29/19 0545 05/30/19 0515  NA 139 140  K 3.4* 3.8  CL 108 107  CO2 25 23  BUN 13 10  CREATININE 0.74 0.66  GLUCOSE 116* 119*  CALCIUM 9.3 9.3   Recent Labs    05/28/19 1745  INR 1.0    EXAM General - Patient is Alert, Appropriate and Oriented Extremity - Neurovascular intact Sensation intact distally Intact pulses distally Compartment soft  Splint intact.  Praveena negative pressure dressing intact with no drainage. Dressing - dressing C/D/I and no drainage Motor Function - intact, moving toes well on exam.   Past Medical History:  Diagnosis Date  . Arthritis   . Diabetes mellitus without complication (HCC)     Assessment/Plan:   1 Day Post-Op Procedure(s) (LRB): OPEN REDUCTION INTERNAL FIXATION (ORIF) TIBIA/FIBULA FRACTURE (Right) Principal Problem:   Fall Active Problems:   Artificial cardiac pacemaker   Essential (primary) hypertension   Adult hypothyroidism   Complete heart block,  post-surgical (HCC)   Bundle branch block, right   H/O aortic valve replacement   Diabetes mellitus, type 2 (HCC)   Tibia fracture   Left tibial fracture  Estimated body mass index is 29.04 kg/m as calculated from the following:   Height as of this encounter: 5\' 8"  (1.727 m).   Weight as of this encounter: 86.6 kg. Advance diet Up with therapy, NWB RLE Pain well controlled Labs and vital signs are stable Care management to assist with discharge.  Patient prefers to discharge home.  Will need to make sure she is progressing well with physical therapy prior to discharge.   DVT Prophylaxis - Lovenox Nonweightbearing right lower extremity   T. , PA-C [facility] 05/30/2019, 10:13 AM

## 2019-05-30 NOTE — Plan of Care (Signed)

## 2019-05-30 NOTE — Progress Notes (Addendum)
PROGRESS NOTE    Zani Kyllonen [facility]  WIO:[id number] DOB: 11-08-1926 DOA: 05/28/2019 PCP: Patient, No Pcp Per   Brief Narrative:  86 y.o. female with medical history significant of high degree heart block status post pacemaker, aortic stenosis status post failed TAVR, diabetes mellitus type 2, HTN, hypothyroidism, osteoarthritis presented to the hospital after sustaining a fall with complains of right leg pain.  Found to have tibial fibula fracture.  Patient underwent ORIF of the right tibia on 05/29/2019.   Assessment & Plan:   Principal Problem:   Fall Active Problems:   Artificial cardiac pacemaker   Essential (primary) hypertension   Adult hypothyroidism   Complete heart block, post-surgical (HCC)   Bundle branch block, right   H/O aortic valve replacement   Diabetes mellitus, type 2 (HCC)   Tibia fracture   Left tibial fracture  Right lower extremity pain secondary to right-sided tibial and fibular fracture, closed Status post ORIF of the right tibia on 05/29/2019 -Pain control, bowel regimen.  Incentive spirometer -Gentle hydration -Bedrest, postop PT/OT -Postop DVT prophylaxis per orthopedic  History of high degree heart block status post pacemaker History of aortic valve replacement/failed TAVR -Doing well from cardiac standpoint.    Hypothyroidism -Resume home meds  Essential hypertension -Resume home meds  Diabetes mellitus type 2 -Diabetic diet.  -Insulin sliding scale and Accu-Chek  DVT prophylaxis: Postop chemoprophylaxis per surgical team when appropriate-Lovenox Code Status: Full code Family Communication: None Disposition Plan: Maintain hospital stay until evaluated by PT/OT and has safe disposition plan  Consultants:   Ortho  Procedures:   ORIF of the right tibia on 05/29/2019  Antimicrobials:   Perioperative   Subjective: Feels much better unless when she tries to move around her right lower extremity which causes her pain. Low-grade  fever overnight   Review of Systems Otherwise negative except as per HPI, including: General = no fevers, chills, dizziness, malaise, fatigue HEENT/EYES = negative for pain, redness, loss of vision, double vision, blurred vision, loss of hearing, sore throat, hoarseness, dysphagia Cardiovascular= negative for chest pain, palpitation, murmurs, lower extremity swelling Respiratory/lungs= negative for shortness of breath, cough, hemoptysis, wheezing, mucus production Gastrointestinal= negative for nausea, vomiting,, abdominal pain, melena, hematemesis Genitourinary= negative for Dysuria, Hematuria, Change in Urinary Frequency MSK = Negative formyalgias, Back Pain,   Neurology= Negative for headache, seizures, numbness, tingling  Psychiatry= Negative for anxiety, depression, suicidal and homocidal ideation Allergy/Immunology= Medication/Food allergy as listed  Skin= Negative for Rash, lesions, ulcers, itching   Objective: Vitals:   05/29/19 2345 05/29/19 2358 05/30/19 0512 05/30/19 0755  BP: (!) 163/77 (!) 150/74 (!) 157/74 (!) 158/78  Pulse: 80 80 79 80  Resp: 16  17 17   Temp: 98.7 F (37.1 C)  99.2 F (37.3 C) 98.2 F (36.8 C)  TempSrc: Oral   Oral  SpO2: 99% 97% 98% 98%  Weight:      Height:        Intake/Output Summary (Last 24 hours) at 05/30/2019 1040 Last data filed at 05/30/2019 0749 Gross per 24 hour  Intake 750 ml  Output 1940 ml  Net -1190 ml   Filed Weights   05/28/19 1551 05/29/19 0055  Weight: 90.7 kg 86.6 kg    Examination:  Constitutional: Not in acute distress Respiratory: Clear to auscultation bilaterally Cardiovascular: Normal sinus rhythm, no rubs Abdomen: Nontender nondistended good bowel sounds Musculoskeletal: Limited range of right lower extremity Skin: Surgical dressing noted without any obvious evidence of bleeding.  Cast in place. Neurologic: CN  2-12 grossly intact.  And nonfocal Psychiatric: Normal judgment and insight. Alert and oriented x  3. Normal mood.       Data Reviewed:   CBC: Recent Labs  Lab 05/28/19 1745 05/29/19 0545 05/30/19 0515  WBC 7.5 7.1 7.3  NEUTROABS 5.5  --   --   HGB 12.3 11.7* 11.5*  HCT 38.6 36.2 35.5*  MCV 101.8* 100.0 99.7  PLT 192 175 497   Basic Metabolic Panel: Recent Labs  Lab 05/28/19 1745 05/29/19 0545 05/30/19 0515  NA 141 139 140  K 3.9 3.4* 3.8  CL 109 108 107  CO2 26 25 23   GLUCOSE 123* 116* 119*  BUN 15 13 10   CREATININE 0.88 0.74 0.66  CALCIUM 9.5 9.3 9.3  MG  --  1.8 2.1   GFR: Estimated Creatinine Clearance: 53.9 mL/min (by C-G formula based on SCr of 0.66 mg/dL). Liver Function Tests: No results for input(s): AST, ALT, ALKPHOS, BILITOT, PROT, ALBUMIN in the last 168 hours. No results for input(s): LIPASE, AMYLASE in the last 168 hours. No results for input(s): AMMONIA in the last 168 hours. Coagulation Profile: Recent Labs  Lab 05/28/19 1745  INR 1.0   Cardiac Enzymes: No results for input(s): CKTOTAL, CKMB, CKMBINDEX, TROPONINI in the last 168 hours. BNP (last 3 results) No results for input(s): PROBNP in the last 8760 hours. HbA1C: Recent Labs    05/28/19 1746  HGBA1C 6.6*   CBG: Recent Labs  Lab 05/29/19 0756 05/29/19 1151 05/29/19 1730 05/29/19 2140 05/30/19 0755  GLUCAP 118* 116* 87 128* 119*   Lipid Profile: No results for input(s): CHOL, HDL, LDLCALC, TRIG, CHOLHDL, LDLDIRECT in the last 72 hours. Thyroid Function Tests: Recent Labs    05/28/19 1746 05/29/19 0545  TSH 0.133*  --   FREET4  --  1.23*   Anemia Panel: No results for input(s): VITAMINB12, FOLATE, FERRITIN, TIBC, IRON, RETICCTPCT in the last 72 hours. Sepsis Labs: No results for input(s): PROCALCITON, LATICACIDVEN in the last 168 hours.  Recent Results (from the past 240 hour(s))  SARS CORONAVIRUS 2 (TAT 6-24 HRS) Nasopharyngeal Nasopharyngeal Swab     Status: None   Collection Time: 05/28/19  5:46 PM   Specimen: Nasopharyngeal Swab  Result Value Ref Range  Status   SARS Coronavirus 2 NEGATIVE NEGATIVE Final    Comment: (NOTE) SARS-CoV-2 target nucleic acids are NOT DETECTED. The SARS-CoV-2 RNA is generally detectable in upper and lower respiratory specimens during the acute phase of infection. Negative results do not preclude SARS-CoV-2 infection, do not rule out co-infections with other pathogens, and should not be used as the sole basis for treatment or other patient management decisions. Negative results must be combined with clinical observations, patient history, and epidemiological information. The expected result is Negative. Fact Sheet for Patients: SugarRoll.be Fact Sheet for Healthcare Providers: https://www.woods-mathews.com/ This test is not yet approved or cleared by the [place] FDA and  has been authorized for detection and/or diagnosis of SARS-CoV-2 by FDA under an Emergency Use Authorization (EUA). This EUA will remain  in effect (meaning this test can be used) for the duration of the COVID-19 declaration under Section 56 4(b)(1) of the Act, 21 U.S.C. section 360bbb-3(b)(1), unless the authorization is terminated or revoked sooner. Performed at [facility], [place] [address], [place], [place] [address]          Radiology Studies: DG Tibia/Fibula Right  Result Date: 05/28/2019 CLINICAL DATA:  Post reduction, fracture EXAM: RIGHT TIBIA AND FIBULA -  2 VIEW COMPARISON:  05/28/2019 FINDINGS: Acute comminuted fracture involving proximal shaft of the fibula with residual 1/2 shaft diameter anterior and less than 1/4 shaft diameter lateral displacement of distal fracture fragment, decreased compared to prior. Acute comminuted fracture involving the distal shaft and metaphysis of the tibia with residual 1/2 shaft diameter lateral and posterior displacement of distal fracture fragment, also decreased compared to prior on the lateral view. 12 mm anteriorly displaced fracture  fragment. Slight posterior angulation of the distal tibial fracture fragment. IMPRESSION: 1. Acute comminuted fracture involving proximal shaft of fibula with residual displacement but decreased compared to prior 2. Acute comminuted, displaced and slightly angulated distal tibial fracture, with decreased displacement on the lateral view. There may be slight increased lateral displacement on frontal view but comparison limited by rotated positioning on the previous exam Electronically Signed   By: Jasmine Pang M.D.   On: 05/28/2019 19:07   DG Tibia/Fibula Right  Result Date: 05/28/2019 CLINICAL DATA:  Recent fall with right leg pain EXAM: RIGHT TIBIA AND FIBULA - 2 VIEW COMPARISON:  03/17/2008 FINDINGS: Oblique fracture is noted through the proximal fibular shaft. Additionally an oblique comminuted fracture through the distal tibia involving the metaphysis is seen. Some rotational component laterally is noted in the distal lower leg. Changes consistent with prior knee replacement are again seen. IMPRESSION: Proximal fibular and distal tibial fracture with some rotational component laterally. Electronically Signed   By: Alcide Clever M.D.   On: 05/28/2019 17:02   DG Ankle 2 Views Right  Result Date: 05/29/2019 CLINICAL DATA:  Right ankle ORIF EXAM: RIGHT ANKLE - 2 VIEW; DG C-ARM 1-60 MIN COMPARISON:  05/28/2019 FINDINGS: Plate and screw fixation noted across the distal tibial fracture. No hardware complicating feature. Anatomic alignment. IMPRESSION: Internal fixation.  No complicating feature. Electronically Signed   By: Charlett Nose M.D.   On: 05/29/2019 17:22   DG Chest Portable 1 View  Result Date: 05/28/2019 CLINICAL DATA:  Recent fall, initial encounter EXAM: PORTABLE CHEST 1 VIEW COMPARISON:  03/16/2008 FINDINGS: Cardiac shadow is within normal limits. Postsurgical changes are seen. Pacing device is noted. Elevation of the right hemidiaphragm is again seen. Mild basilar atelectasis is noted  bilaterally. No bony abnormality is seen. IMPRESSION: Mild bibasilar atelectasis. Electronically Signed   By: Alcide Clever M.D.   On: 05/28/2019 17:39   DG C-Arm 1-60 Min  Result Date: 05/29/2019 CLINICAL DATA:  Right ankle ORIF EXAM: RIGHT ANKLE - 2 VIEW; DG C-ARM 1-60 MIN COMPARISON:  05/28/2019 FINDINGS: Plate and screw fixation noted across the distal tibial fracture. No hardware complicating feature. Anatomic alignment. IMPRESSION: Internal fixation.  No complicating feature. Electronically Signed   By: Charlett Nose M.D.   On: 05/29/2019 17:22        Scheduled Meds: . acetaminophen  1,000 mg Oral Q6H  . Chlorhexidine Gluconate Cloth  6 each Topical Daily  . docusate sodium  100 mg Oral BID  . enoxaparin (LOVENOX) injection  40 mg Subcutaneous Q24H  . insulin aspart  0-15 Units Subcutaneous TID WC  . insulin aspart  0-5 Units Subcutaneous QHS  . insulin aspart  3 Units Subcutaneous TID WC  . levothyroxine  88 mcg Oral QAC breakfast  . metoprolol succinate  50 mg Oral Daily  . pantoprazole  40 mg Oral Daily  . [START ON 06/02/2019] Vitamin D (Ergocalciferol)  50,000 Units Oral Q Wed   Continuous Infusions: .  ceFAZolin (ANCEF) IV 2 g (05/30/19 1324)  LOS: 2 days   Time spent= 35 mins    Shanikka Wonders Joline Maxcy, MD [facility]  If 7PM-7AM, please contact night-coverage  05/30/2019, 10:40 AM

## 2019-05-31 LAB — BASIC METABOLIC PANEL
Anion gap: 12 (ref 5–15)
BUN: 12 mg/dL (ref 8–23)
CO2: 22 mmol/L (ref 22–32)
Calcium: 9.2 mg/dL (ref 8.9–10.3)
Chloride: 103 mmol/L (ref 98–111)
Creatinine, Ser: 0.79 mg/dL (ref 0.44–1.00)
GFR calc Af Amer: >60 mL/min (ref >60)
GFR calc non Af Amer: >60 mL/min (ref >60)
Glucose, Bld: 115 mg/dL — ABNORMAL HIGH (ref 70–99)
Potassium: 3.7 mmol/L (ref 3.5–5.1)
Sodium: 137 mmol/L (ref 135–145)

## 2019-05-31 LAB — MAGNESIUM: Magnesium: 2.1 mg/dL (ref 1.7–2.4)

## 2019-05-31 LAB — CBC
HCT: 34.6 % — ABNORMAL LOW (ref 36.0–46.0)
Hemoglobin: 11 g/dL — ABNORMAL LOW (ref 12.0–15.0)
MCH: 31.5 pg (ref 26.0–34.0)
MCHC: 31.8 g/dL (ref 30.0–36.0)
MCV: 99.1 fL (ref 80.0–100.0)
Platelets: 181 10*3/uL (ref 150–400)
RBC: 3.49 MIL/uL — ABNORMAL LOW (ref 3.87–5.11)
RDW: 13.9 % (ref 11.5–15.5)
WBC: 8.4 10*3/uL (ref 4.0–10.5)
nRBC: 0 % (ref 0.0–0.2)

## 2019-05-31 LAB — GLUCOSE, CAPILLARY
Glucose-Capillary: 130 mg/dL — ABNORMAL HIGH (ref 70–99)
Glucose-Capillary: 120 mg/dL — ABNORMAL HIGH (ref 70–99)
Glucose-Capillary: 114 mg/dL — ABNORMAL HIGH (ref 70–99)
Glucose-Capillary: 112 mg/dL — ABNORMAL HIGH (ref 70–99)

## 2019-05-31 MED ORDER — HYDRALAZINE HCL 20 MG/ML IJ SOLN
10.0000 mg | Freq: Once | INTRAMUSCULAR | Status: DC
  Filled 2019-05-31: qty 0.5

## 2019-05-31 MED ORDER — ENOXAPARIN SODIUM 40 MG/0.4ML ~~LOC~~ SOLN: 40.0000 mg | SUBCUTANEOUS | 0 refills | Status: DC

## 2019-05-31 NOTE — Progress Notes (Signed)
Physical Therapy Treatment Patient Details Name: Zoe [name] MRN: [id number] DOB: 04-09-1927 Today's Date: 05/31/2019    History of Present Illness Pt admitted for fall while carrying garbage sustaining proximal fibular fx and distal tibial fx on R LE now s/p ORIF on 05/29/19. History includes DM and arthritis.    PT Comments    Pt in bed upon arrival having finished lunch tray and agreeable to PT. Pt performed LE therex required min cuing for correct performance and light physical assist for RLE. Pt reports having no pain throughout session. Improved ability to get EOB and return to supine without physical assist this session. Pt performed 2 trials of STS and ambulation attempts. Pt required mod A to power up to standing with therapist foot under pts right foot to remind about precautions. Pt unable to completely hop on Left foot limiting ambulation ability. Pt continues to require max cuing to maintain NWB. PT provided demonstration of ambulation with RW and RLE NWB for pt. On second trial with bed elevated pt performed STS with improved technique and maintaining NWB. On second trial pt able to perform 2-3 hops on left foot in order to maintain NWB on RLE. Outside of those couple hops pt unable to maintain NWB despite max cuing. Pt likes to keep right leg on the ground and therefore doesn't maintain NWB. Pt is having increased difficulty understanding and maintaining weightbearing precautions during OOB mobility. Pt will continue to benefit from skilled acute PT in order to further maximize mobility.   Follow Up Recommendations  SNF     Equipment Recommendations  Rolling walker with 5" wheels    Recommendations for Other Services       Precautions / Restrictions Precautions Precautions: Fall Restrictions Weight Bearing Restrictions: Yes RLE Weight Bearing: Non weight bearing    Mobility  Bed Mobility Overal bed mobility: Needs Assistance Bed Mobility: Supine to Sit;Sit to  Supine     Supine to sit: Min guard Sit to supine: Min guard   General bed mobility comments: pt able to get EOB and return to supine without physical assist this session, pt required increased time and effort and utilized bed rails, able to scoot up in bed using LLE to bridge  Transfers Overall transfer level: Needs assistance Equipment used: Rolling walker (2 wheeled) Transfers: Sit to/from Stand Sit to Stand: Mod assist;Min assist;From elevated surface         General transfer comment: STS x2 trials from bed, continues to require cuing for hand placement and use of RW, first trial required mod A to power to upright, still continues to required max cuing and therapist foot under pts foot to maintain RLE NWB, second trial from elevated bed pt with improved technique and min A to rise  Ambulation/Gait Ambulation/Gait assistance: Min assist Gait Distance (Feet): 3 Feet Assistive device: Rolling walker (2 wheeled) Gait Pattern/deviations: Step-to pattern Gait velocity: dec   General Gait Details: x2 trials attempted a few steps with hop to pattern and RW, first attempt pt unable to completely hop on LLE to maintain NWB on RLE, PT demonstrated proper step to pattern with NWB on RLE and RW then pt performed second trial, able to complete 2-3 very small hops, pt required max cuing, min A to manage RW and maintain balance   Stairs             Wheelchair Mobility    Modified Rankin (Stroke Patients Only)       Balance Overall balance assessment: Needs  assistance Sitting-balance support: Feet supported Sitting balance-Leahy Scale: Good Sitting balance - Comments: steady EOB   Standing balance support: Bilateral upper extremity supported;During functional activity Standing balance-Leahy Scale: Fair Standing balance comment: reliant on B UE support for standing and ambulation                            Cognition Arousal/Alertness: Awake/alert Behavior During  Therapy: WFL for tasks assessed/performed Overall Cognitive Status: Within Functional Limits for tasks assessed                                        Exercises Total Joint Exercises Ankle Circles/Pumps: AROM;Left;10 reps Quad Sets: AROM;Both;10 reps(max vc/tc due to pt lack of understanding) Towel Squeeze: AROM;Both;10 reps Hip ABduction/ADduction: AROM;Both;10 reps;AAROM;Right Straight Leg Raises: AROM;Both;10 reps;AAROM;Right    General Comments General comments (skin integrity, edema, etc.): wound vac in place throughout session, VSS      Pertinent Vitals/Pain Pain Assessment: No/denies pain    Home Living                      Prior Function            PT Goals (current goals can now be found in the care plan section) Progress towards PT goals: Progressing toward goals(slowly, limited by inability to maintain NWB)    Frequency    BID      PT Plan Current plan remains appropriate    Co-evaluation              AM-PAC PT "6 Clicks" Mobility   Outcome Measure  Help needed turning from your back to your side while in a flat bed without using bedrails?: A Little Help needed moving from lying on your back to sitting on the side of a flat bed without using bedrails?: A Little Help needed moving to and from a bed to a chair (including a wheelchair)?: A Little Help needed standing up from a chair using your arms (e.g., wheelchair or bedside chair)?: A Little Help needed to walk in hospital room?: A Lot Help needed climbing 3-5 steps with a railing? : A Lot 6 Click Score: 16    End of Session Equipment Utilized During Treatment: Gait belt Activity Tolerance: Patient tolerated treatment well Patient left: in bed;with call bell/phone within reach Nurse Communication: Mobility status PT Visit Diagnosis: Muscle weakness (generalized) (M62.81);Difficulty in walking, not elsewhere classified (R26.2);Pain;Unsteadiness on feet (R26.81);History  of falling (Z91.81) Pain - Right/Left: Right Pain - part of body: Ankle and joints of foot     Time: 9833-8250 PT Time Calculation (min) (ACUTE ONLY): 21 min  Charges:  $Therapeutic Activity: 8-22 mins                     Zoe [name] PT, DPT 1:58 PM,05/31/19 [phone]    Zoe [name] Numbers 05/31/2019, 1:51 PM

## 2019-05-31 NOTE — Progress Notes (Signed)
BP 182/90, received verbal order from on call provider Leonette Most B. NP for one time dose of hydralazine.

## 2019-05-31 NOTE — Progress Notes (Signed)
Physical Therapy Treatment Patient Details Name: Zoe [name] MRN: [id number] DOB: 21-Sep-1926 Today's Date: 05/31/2019    History of Present Illness Pt admitted for fall while carrying garbage sustaining proximal fibular fx and distal tibial fx on R LE now s/p ORIF on 05/29/19. History includes DM and arthritis.    PT Comments    Pt received supine in bed with reports of minimal pain. Pt agreeable to PT. Pt educated on NWB for RLE. Pt performed LE therex required min-mod cuing and light min physical assist for RLE for correct performance. Pt required light min A to get EOB. Pt steady EOB. Pt required min A for STS requiring cuing for hand placement and NWB. PT foot underneath pts right foot to serve as reminder for NWB and to assess pts ability to maintain precautions. Pt unable to completely maintain NWB on RLE during transfer. Pt required min-mod A for stand pivot to recliner. Pt required max VC for NWB and min A for RW mgt. Pt unable to complete hop on L foot increasing difficulty with transfers. Pt unable to lift RLE off ground for longer than brief periods. Pt unable to completely maintain NWB precaution during transfer despite max multimodal cuing and assist from therapist. Further ambulation deferred due to pts inability to maintain WBing precautions. Pt is progressing towards PT goals. Pt will continue to benefit from acute PT to improve strength, balance, ROM and safety with functional mobility.   Follow Up Recommendations  SNF     Equipment Recommendations  Rolling walker with 5" wheels    Recommendations for Other Services       Precautions / Restrictions Precautions Precautions: Fall Restrictions Weight Bearing Restrictions: Yes RLE Weight Bearing: Non weight bearing    Mobility  Bed Mobility Overal bed mobility: Needs Assistance Bed Mobility: Supine to Sit     Supine to sit: Min assist     General bed mobility comments: light min A for RLE advancement to get EOB,  steady once EOB, able to scoot with min guard in order to have better positioning EOB and feet touching ground  Transfers Overall transfer level: Needs assistance Equipment used: Rolling walker (2 wheeled) Transfers: Sit to/from [facility] Sit to Stand: Min assist Stand pivot transfers: Mod assist;Min assist       General transfer comment: min A to rise, cuing for hand placement and NWB, PT foot underneath pt RLE in order to remind about NWB, pt still trying to WB through RLE. min-mod A for stand pivot transfer, pt unable to keep RLE lifted off floor to ensure NWB, pt likes to rest RLE on floor and unable to fully maintain NWB during transfer, pt unable to really hop on LLE with decreased clearance with L LE increasing difficulty with mobility, max cuing for maintaining NWB with min carryover, min A for RW mgt  Ambulation/Gait             General Gait Details: deferred for safety secondary to pt unable to completely hop on L foot in order to maintain NWB on RLE   Stairs             Wheelchair Mobility    Modified Rankin (Stroke Patients Only)       Balance Overall balance assessment: Needs assistance Sitting-balance support: Feet supported Sitting balance-Leahy Scale: Good Sitting balance - Comments: steady EOB   Standing balance support: Bilateral upper extremity supported;During functional activity Standing balance-Leahy Scale: Fair Standing balance comment: reliant on B UE support  Cognition Arousal/Alertness: Awake/alert Behavior During Therapy: WFL for tasks assessed/performed Overall Cognitive Status: Within Functional Limits for tasks assessed                                        Exercises Total Joint Exercises Quad Sets: AROM;Both;10 reps(pt requires max vc/tc due to difficulty understanding exercise) Hip ABduction/ADduction: AROM;Both;10 reps;AAROM;Right Straight Leg Raises:  AROM;Both;10 reps;AAROM;Right    General Comments General comments (skin integrity, edema, etc.): wound vac in place throughout session      Pertinent Vitals/Pain Pain Assessment: Faces Faces Pain Scale: Hurts a little bit Pain Location: RLE Pain Descriptors / Indicators: Sore;Aching Pain Intervention(s): Limited activity within patient's tolerance;Monitored during session;Repositioned    Home Living                      Prior Function            PT Goals (current goals can now be found in the care plan section) Progress towards PT goals: Progressing toward goals    Frequency    BID      PT Plan Current plan remains appropriate    Co-evaluation              AM-PAC PT "6 Clicks" Mobility   Outcome Measure  Help needed turning from your back to your side while in a flat bed without using bedrails?: A Little Help needed moving from lying on your back to sitting on the side of a flat bed without using bedrails?: A Little Help needed moving to and from a bed to a chair (including a wheelchair)?: A Little Help needed standing up from a chair using your arms (e.g., wheelchair or bedside chair)?: A Little Help needed to walk in hospital room?: A Lot Help needed climbing 3-5 steps with a railing? : Total 6 Click Score: 15    End of Session Equipment Utilized During Treatment: Gait belt Activity Tolerance: Patient tolerated treatment well Patient left: in bed;with call bell/phone within reach Nurse Communication: Mobility status PT Visit Diagnosis: Muscle weakness (generalized) (M62.81);Difficulty in walking, not elsewhere classified (R26.2);Pain;Unsteadiness on feet (R26.81);History of falling (Z91.81) Pain - Right/Left: Right Pain - part of body: Ankle and joints of foot     Time: 1700-1749 PT Time Calculation (min) (ACUTE ONLY): 21 min  Charges:  $Therapeutic Exercise: 8-22 mins                     Karoline Caldwell PT, DPT 9:37  AM,05/31/19 [phone]    [name] [name] 05/31/2019, 9:31 AM

## 2019-05-31 NOTE — Plan of Care (Signed)
  Problem: Education: Goal: Knowledge of General Education information will improve Description: Including pain rating scale, medication(s)/side effects and non-pharmacologic comfort measures Outcome: Progressing   Problem: Health Behavior/Discharge Planning: Goal: Ability to manage health-related needs will improve Outcome: Progressing   Problem: Clinical Measurements: Goal: Ability to maintain clinical measurements within normal limits will improve Outcome: Progressing Goal: Will remain free from infection Outcome: Progressing Goal: Diagnostic test results will improve Outcome: Progressing Goal: Respiratory complications will improve Outcome: Progressing Goal: Cardiovascular complication will be avoided Outcome: Progressing   Problem: Activity: Goal: Risk for activity intolerance will decrease Outcome: Progressing   Problem: Nutrition: Goal: Adequate nutrition will be maintained Outcome: Progressing   Problem: Coping: Goal: Level of anxiety will decrease Outcome: Progressing   Problem: Elimination: Goal: Will not experience complications related to bowel motility Outcome: Progressing Goal: Will not experience complications related to urinary retention Outcome: Progressing   Problem: Pain Managment: Goal: General experience of comfort will improve Outcome: Progressing   Problem: Skin Integrity: Goal: Risk for impaired skin integrity will decrease Outcome: Progressing   Problem: Education: Goal: Knowledge of the prescribed therapeutic regimen will improve Outcome: Progressing   Problem: Activity: Goal: Ability to increase mobility will improve Outcome: Progressing   Problem: Physical Regulation: Goal: Postoperative complications will be avoided or minimized Outcome: Progressing   Problem: Pain Management: Goal: Pain level will decrease with appropriate interventions Outcome: Progressing

## 2019-05-31 NOTE — Progress Notes (Signed)
PROGRESS NOTE    [facility]well[facility]uney [facility]  AJO:[id number] DOB: 1927/02/28 DOA: 05/28/2019 PCP: Patient, No Pcp Per   Brief Narrative:  86 y.o. female with medical history significant of high degree heart block status post pacemaker, aortic stenosis status post failed TAVR, diabetes mellitus type 2, HTN, hypothyroidism, osteoarthritis presented to the hospital after sustaining a fall with complains of right leg pain.  Found to have tibial fibula fracture.  Patient underwent ORIF of the right tibia on 05/29/2019.  Tolerated procedure well, postop PT recommended SNF but patient would rather go home with home health.  Home health was ordered along with 5 inch rolling walker.   Assessment & Plan:   Principal Problem:   Fall Active Problems:   Artificial cardiac pacemaker   Essential (primary) hypertension   Adult hypothyroidism   Complete heart block, post-surgical (HCC)   Bundle branch block, right   H/O aortic valve replacement   Diabetes mellitus, type 2 (HCC)   Tibia fracture   Left tibial fracture  Right lower extremity pain secondary to right-sided tibial and fibular fracture, closed Status post ORIF of the right tibia on 05/29/2019 -Pain control, bowel regimen.  Incentive spirometer -Patient doing well, postop DVT per orthopedic -PT OT recommending SNF but patient prefers to go home with home health.  Home health Aveline walker has been prescribed.  Ortho recs: Up with therapy, NWB RLE Pain well controlled Labs and vital signs are stable Care management to assist with discharge.  Patient prefers to discharge home.  Will need to make sure she is progressing well with physical therapy prior to discharge. Follow up with [facility] 06/04/2019 for dressing change  DVT Prophylaxis - Lovenox Nonweightbearing right lower extremity  History of high degree heart block status post pacemaker History of aortic valve replacement/failed TAVR -Doing well from cardiac standpoint.     Hypothyroidism -Resume home meds  Essential hypertension -Resume home meds  Diabetes mellitus type 2 -Diabetic diet.  -Insulin sliding scale and Accu-Chek  DVT prophylaxis: Lovenox Code Status: Full code Family Communication: None Disposition Plan: Maintain hospital stay for better pain control while she is working with PT/OT.  Does not want to go to SNF therefore will transition her home once pain is better controlled while doing therapy.  Likely discharge home tomorrow morning.  Consultants:   Ortho  Procedures:   ORIF of the right tibia on 05/29/2019  Antimicrobials:   Perioperative   Subjective: Overall feeling okay still having difficulty ambulating due to the pain.  She does not want to go to rehab and would only go home with therapy.  She wants her pain to be better controlled.  Review of Systems Otherwise negative except as per HPI, including: General = no fevers, chills, dizziness, malaise, fatigue HEENT/EYES = negative for pain, redness, loss of vision, double vision, blurred vision, loss of hearing, sore throat, hoarseness, dysphagia Cardiovascular= negative for chest pain, palpitation, murmurs, lower extremity swelling Respiratory/lungs= negative for shortness of breath, cough, hemoptysis, wheezing, mucus production Gastrointestinal= negative for nausea, vomiting,, abdominal pain, melena, hematemesis Genitourinary= negative for Dysuria, Hematuria, Change in Urinary Frequency MSK = Negative for arthralgia, myalgias, Back Pain, Joint swelling  Neurology= Negative for headache, seizures, numbness, tingling  Psychiatry= Negative for anxiety, depression, suicidal and homocidal ideation Allergy/Immunology= Medication/Food allergy as listed  Skin= Negative for Rash, lesions, ulcers, itching   Objective: Vitals:   05/31/19 0601 05/31/19 0647 05/31/19 0837 05/31/19 1202  BP: (!) 182/90 (!) 157/80 140/79 90/78  Pulse: 80 82  80 77  Resp: 16  18 16   Temp:  98.5 F (36.9 C)  98 F (36.7 C) 99.1 F (37.3 C)  TempSrc: Oral   Oral  SpO2: 100%  98% 98%  Weight:      Height:        Intake/Output Summary (Last 24 hours) at 05/31/2019 1209 Last data filed at 05/31/2019 0900 Gross per 24 hour  Intake 600 ml  Output 200 ml  Net 400 ml   Filed Weights   05/28/19 1551 05/29/19 0055  Weight: 90.7 kg 86.6 kg    Examination:  Constitutional: Not in acute distress Respiratory: Clear to auscultation bilaterally Cardiovascular: Normal sinus rhythm, no rubs Abdomen: Nontender nondistended good bowel sounds Musculoskeletal: No edema noted, limited range of motion right lower extremity Skin: Right lower extremity dressing noted Neurologic: CN 2-12 grossly intact.  And nonfocal Psychiatric: Normal judgment and insight. Alert and oriented x 3. Normal mood.     Data Reviewed:   CBC: Recent Labs  Lab 05/28/19 1745 05/29/19 0545 05/30/19 0515 05/31/19 0428  WBC 7.5 7.1 7.3 8.4  NEUTROABS 5.5  --   --   --   HGB 12.3 11.7* 11.5* 11.0*  HCT 38.6 36.2 35.5* 34.6*  MCV 101.8* 100.0 99.7 99.1  PLT 192 175 174 283   Basic Metabolic Panel: Recent Labs  Lab 05/28/19 1745 05/29/19 0545 05/30/19 0515 05/31/19 0428  NA 141 139 140 137  K 3.9 3.4* 3.8 3.7  CL 109 108 107 103  CO2 26 25 23 22   GLUCOSE 123* 116* 119* 115*  BUN 15 13 10 12   CREATININE 0.88 0.74 0.66 0.79  CALCIUM 9.5 9.3 9.3 9.2  MG  --  1.8 2.1 2.1   GFR: Estimated Creatinine Clearance: 53.9 mL/min (by C-G formula based on SCr of 0.79 mg/dL). Liver Function Tests: No results for input(s): AST, ALT, ALKPHOS, BILITOT, PROT, ALBUMIN in the last 168 hours. No results for input(s): LIPASE, AMYLASE in the last 168 hours. No results for input(s): AMMONIA in the last 168 hours. Coagulation Profile: Recent Labs  Lab 05/28/19 1745  INR 1.0   Cardiac Enzymes: No results for input(s): CKTOTAL, CKMB, CKMBINDEX, TROPONINI in the last 168 hours. BNP (last 3 results) No  results for input(s): PROBNP in the last 8760 hours. HbA1C: Recent Labs    05/28/19 1746  HGBA1C 6.6*   CBG: Recent Labs  Lab 05/30/19 1158 05/30/19 1647 05/30/19 2147 05/31/19 0835 05/31/19 1202  GLUCAP 156* 106* 134* 130* 120*   Lipid Profile: No results for input(s): CHOL, HDL, LDLCALC, TRIG, CHOLHDL, LDLDIRECT in the last 72 hours. Thyroid Function Tests: Recent Labs    05/28/19 1746 05/29/19 0545  TSH 0.133*  --   FREET4  --  1.23*   Anemia Panel: No results for input(s): VITAMINB12, FOLATE, FERRITIN, TIBC, IRON, RETICCTPCT in the last 72 hours. Sepsis Labs: No results for input(s): PROCALCITON, LATICACIDVEN in the last 168 hours.  Recent Results (from the past 240 hour(s))  SARS CORONAVIRUS 2 (TAT 6-24 HRS) Nasopharyngeal Nasopharyngeal Swab     Status: None   Collection Time: 05/28/19  5:46 PM   Specimen: Nasopharyngeal Swab  Result Value Ref Range Status   SARS Coronavirus 2 NEGATIVE NEGATIVE Final    Comment: (NOTE) SARS-CoV-2 target nucleic acids are NOT DETECTED. The SARS-CoV-2 RNA is generally detectable in upper and lower respiratory specimens during the acute phase of infection. Negative results do not preclude SARS-CoV-2 infection, do not rule out co-infections with  other pathogens, and should not be used as the sole basis for treatment or other patient management decisions. Negative results must be combined with clinical observations, patient history, and epidemiological information. The expected result is Negative. Fact Sheet for Patients: HairSlick.no Fact Sheet for Healthcare Providers: quierodirigir.com This test is not yet approved or cleared by the [place] FDA and  has been authorized for detection and/or diagnosis of SARS-CoV-2 by FDA under an Emergency Use Authorization (EUA). This EUA will remain  in effect (meaning this test can be used) for the duration of the COVID-19  declaration under Section 56 4(b)(1) of the Act, 21 U.S.C. section 360bbb-3(b)(1), unless the authorization is terminated or revoked sooner. Performed at [facility], [address], [place], [place] [address]          Radiology Studies: DG Ankle 2 Views Right  Result Date: 05/29/2019 CLINICAL DATA:  Right ankle ORIF EXAM: RIGHT ANKLE - 2 VIEW; DG C-ARM 1-60 MIN COMPARISON:  05/28/2019 FINDINGS: Plate and screw fixation noted across the distal tibial fracture. No hardware complicating feature. Anatomic alignment. IMPRESSION: Internal fixation.  No complicating feature. Electronically Signed   By: Charlett Nose M.D.   On: 05/29/2019 17:22   DG C-Arm 1-60 Min  Result Date: 05/29/2019 CLINICAL DATA:  Right ankle ORIF EXAM: RIGHT ANKLE - 2 VIEW; DG C-ARM 1-60 MIN COMPARISON:  05/28/2019 FINDINGS: Plate and screw fixation noted across the distal tibial fracture. No hardware complicating feature. Anatomic alignment. IMPRESSION: Internal fixation.  No complicating feature. Electronically Signed   By: Charlett Nose M.D.   On: 05/29/2019 17:22        Scheduled Meds: . Chlorhexidine Gluconate Cloth  6 each Topical Daily  . docusate sodium  100 mg Oral BID  . enoxaparin (LOVENOX) injection  40 mg Subcutaneous Q24H  . hydrALAZINE  10 mg Intravenous Once  . insulin aspart  0-15 Units Subcutaneous TID WC  . insulin aspart  0-5 Units Subcutaneous QHS  . insulin aspart  3 Units Subcutaneous TID WC  . levothyroxine  88 mcg Oral QAC breakfast  . metoprolol succinate  50 mg Oral Daily  . pantoprazole  40 mg Oral Daily  . [START ON 06/02/2019] Vitamin D (Ergocalciferol)  50,000 Units Oral Q Wed   Continuous Infusions:    LOS: 3 days   Time spent= 35 mins    Nabiha Planck Joline Maxcy, MD [facility]  If 7PM-7AM, please contact night-coverage  05/31/2019, 12:09 PM

## 2019-05-31 NOTE — Care Management Important Message (Signed)
Important Message  Patient Details  Name: Zoe [name] MRN: [id number] Date of Birth: 1926/06/18   Medicare Important Message Given:  Yes     [name] 05/31/2019, 12:06 PM

## 2019-05-31 NOTE — Progress Notes (Addendum)
   Subjective: 2 Days Post-Op Procedure(s) (LRB): OPEN REDUCTION INTERNAL FIXATION (ORIF) TIBIA/FIBULA FRACTURE (Right) Patient reports pain as 0 on 0-10 scale.   Patient is well, and has had no acute complaints or problems Denies any CP, SOB, ABD pain. We will continue with physical therapy today.   Objective: Vital signs in last 24 hours: Temp:  [98.2 F (36.8 C)-100 F (37.8 C)] 98.5 F (36.9 C) (01/11 0601) Pulse Rate:  [79-82] 82 (01/11 0647) Resp:  [16-18] 16 (01/11 0601) BP: (134-182)/(70-90) 157/80 (01/11 0647) SpO2:  [96 %-100 %] 100 % (01/11 0601)  Intake/Output from previous day: 01/10 0701 - 01/11 0700 In: 580 [P.O.:480; IV Piggyback:100] Out: 200 [Urine:200] Intake/Output this shift: No intake/output data recorded.  Recent Labs    05/28/19 1745 05/29/19 0545 05/30/19 0515 05/31/19 0428  HGB 12.3 11.7* 11.5* 11.0*   Recent Labs    05/30/19 0515 05/31/19 0428  WBC 7.3 8.4  RBC 3.56* 3.49*  HCT 35.5* 34.6*  PLT 174 181   Recent Labs    05/30/19 0515 05/31/19 0428  NA 140 137  K 3.8 3.7  CL 107 103  CO2 23 22  BUN 10 12  CREATININE 0.66 0.79  GLUCOSE 119* 115*  CALCIUM 9.3 9.2   Recent Labs    05/28/19 1745  INR 1.0    EXAM General - Patient is Alert, Appropriate and Oriented Extremity - Neurovascular intact Sensation intact distally Intact pulses distally Compartment soft  Splint intact.  Praveena negative pressure dressing intact with no drainage. Dressing - dressing C/D/I and no drainage Motor Function - intact, moving toes well on exam.   Past Medical History:  Diagnosis Date  . Arthritis   . Diabetes mellitus without complication (HCC)     Assessment/Plan:   2 Days Post-Op Procedure(s) (LRB): OPEN REDUCTION INTERNAL FIXATION (ORIF) TIBIA/FIBULA FRACTURE (Right) Principal Problem:   Fall Active Problems:   Artificial cardiac pacemaker   Essential (primary) hypertension   Adult hypothyroidism   Complete heart block,  post-surgical (HCC)   Bundle branch block, right   H/O aortic valve replacement   Diabetes mellitus, type 2 (HCC)   Tibia fracture   Left tibial fracture  Estimated body mass index is 29.04 kg/m as calculated from the following:   Height as of this encounter: 5\' 8"  (1.727 m).   Weight as of this encounter: 86.6 kg. Advance diet Up with therapy, NWB RLE Pain well controlled Labs and vital signs are stable Care management to assist with discharge.  Patient prefers to discharge home.  Will need to make sure she is progressing well with physical therapy prior to discharge. Follow up with [facility] 06/07/2019 for dressing change. Keep splint clean and dry. Lovenox 40 mg SQ daily x 14 days at discharge   DVT Prophylaxis - Lovenox Nonweightbearing right lower extremity   T. 06/09/2019, PA-C [facility] 05/31/2019, 7:53 AM

## 2019-05-31 NOTE — Plan of Care (Signed)
  Problem: Education: Goal: Knowledge of General Education information will improve Description: Including pain rating scale, medication(s)/side effects and non-pharmacologic comfort measures Outcome: Progressing   Problem: Health Behavior/Discharge Planning: Goal: Ability to manage health-related needs will improve Outcome: Progressing   Problem: Clinical Measurements: Goal: Ability to maintain clinical measurements within normal limits will improve Outcome: Progressing Goal: Will remain free from infection Outcome: Progressing Goal: Diagnostic test results will improve Outcome: Progressing Goal: Respiratory complications will improve Outcome: Progressing Goal: Cardiovascular complication will be avoided Outcome: Progressing   Problem: Activity: Goal: Risk for activity intolerance will decrease Outcome: Progressing   Problem: Nutrition: Goal: Adequate nutrition will be maintained Outcome: Progressing   Problem: Coping: Goal: Level of anxiety will decrease Outcome: Progressing   Problem: Pain Managment: Goal: General experience of comfort will improve Outcome: Progressing   Problem: Safety: Goal: Ability to remain free from injury will improve Outcome: Progressing   Problem: Skin Integrity: Goal: Risk for impaired skin integrity will decrease Outcome: Progressing   Problem: Education: Goal: Knowledge of the prescribed therapeutic regimen will improve Outcome: Progressing   Problem: Physical Regulation: Goal: Postoperative complications will be avoided or minimized Outcome: Progressing   Problem: Pain Management: Goal: Pain level will decrease with appropriate interventions Outcome: Progressing   Problem: Skin Integrity: Goal: Will show signs of wound healing Outcome: Progressing

## 2019-06-01 LAB — CBC
HCT: 32.6 % — ABNORMAL LOW (ref 36.0–46.0)
Hemoglobin: 10.6 g/dL — ABNORMAL LOW (ref 12.0–15.0)
MCH: 32.2 pg (ref 26.0–34.0)
MCHC: 32.5 g/dL (ref 30.0–36.0)
MCV: 99.1 fL (ref 80.0–100.0)
Platelets: 188 10*3/uL (ref 150–400)
RBC: 3.29 MIL/uL — ABNORMAL LOW (ref 3.87–5.11)
RDW: 13.9 % (ref 11.5–15.5)
WBC: 8.1 10*3/uL (ref 4.0–10.5)
nRBC: 0 % (ref 0.0–0.2)

## 2019-06-01 LAB — BASIC METABOLIC PANEL
Anion gap: 8 (ref 5–15)
BUN: 13 mg/dL (ref 8–23)
CO2: 27 mmol/L (ref 22–32)
Calcium: 9.3 mg/dL (ref 8.9–10.3)
Chloride: 104 mmol/L (ref 98–111)
Creatinine, Ser: 0.87 mg/dL (ref 0.44–1.00)
GFR calc Af Amer: >60 mL/min (ref >60)
GFR calc non Af Amer: 59 mL/min — ABNORMAL LOW (ref >60)
Glucose, Bld: 111 mg/dL — ABNORMAL HIGH (ref 70–99)
Potassium: 4.1 mmol/L (ref 3.5–5.1)
Sodium: 139 mmol/L (ref 135–145)

## 2019-06-01 LAB — SARS CORONAVIRUS 2 (TAT 6-24 HRS): SARS Coronavirus 2: NEGATIVE

## 2019-06-01 LAB — GLUCOSE, CAPILLARY
Glucose-Capillary: 99 mg/dL (ref 70–99)
Glucose-Capillary: 147 mg/dL — ABNORMAL HIGH (ref 70–99)
Glucose-Capillary: 169 mg/dL — ABNORMAL HIGH (ref 70–99)
Glucose-Capillary: 98 mg/dL (ref 70–99)

## 2019-06-01 LAB — MAGNESIUM: Magnesium: 2.1 mg/dL (ref 1.7–2.4)

## 2019-06-01 NOTE — Progress Notes (Signed)
PROGRESS NOTE    Haylea Schlichting [facility]  KWI:[id number] DOB: May 31, 1926 DOA: 05/28/2019 PCP: Patient, No Pcp Per   Brief Narrative:  86 y.o. female with medical history significant of high degree heart block status post pacemaker, aortic stenosis status post failed TAVR, diabetes mellitus type 2, HTN, hypothyroidism, osteoarthritis presented to the hospital after sustaining a fall with complains of right leg pain.  Found to have tibial fibula fracture.  Patient underwent ORIF of the right tibia on 05/29/2019.  Tolerated procedure well, postop PT recommended SNF but patient would rather go home with home health.  Home health was ordered along with 5 inch rolling walker.  But but eventually recommendations were changed to skilled nursing facility due to lack of support at home, nonweightbearing of her right lower extremity with wound VAC in place.   Assessment & Plan:   Principal Problem:   Fall Active Problems:   Artificial cardiac pacemaker   Essential (primary) hypertension   Adult hypothyroidism   Complete heart block, post-surgical (HCC)   Bundle branch block, right   H/O aortic valve replacement   Diabetes mellitus, type 2 (HCC)   Tibia fracture   Left tibial fracture  Right lower extremity pain secondary to right-sided tibial and fibular fracture, closed Status post ORIF of the right tibia on 05/29/2019 -Pain control, bowel regimen.  Incentive spirometer.  None weightbearing as tolerated right lower extremity.  Wound VAC in place which will be changed to Praveena at the time of discharge -PT OT recommending SNF but not patient is agreeable therefore will try and make arrangements.  Preplacement COVID-19 test ordered. Follow-up outpatient orthopedic 1/18 for dressing change.  Maintain splint clean and dry.  Lovenox subcu for total of 14 days   Ortho recs: Up with therapy, NWB RLE Pain well controlled Labs and vital signs are stable Care management to assist with discharge.  Patient  prefers to discharge home.  Will need to make sure she is progressing well with physical therapy prior to discharge.  DVT Prophylaxis - Lovenox Nonweightbearing right lower extremity  History of high degree heart block status post pacemaker History of aortic valve replacement/failed TAVR -Doing well from cardiac standpoint.    Hypothyroidism -Resume home meds  Essential hypertension -Resume home meds  Diabetes mellitus type 2 -Diabetic diet.  -Insulin sliding scale and Accu-Chek  DVT prophylaxis: Lovenox Code Status: Full code Family Communication: None Disposition Plan: Maintain hospital stay until safe disposition.  Patient has lack of social support at home and currently nonweightbearing on right lower extremity therefore requires skilled nursing facility placement.  PT/OT evaluation performed.  SNF recommended.  SNF appropriate as the patient has received 3 days of hospital care (or the 3 days stay Has been made by the patient's insurance company) and is felt to need rehab services to restore this patient to their prior level of function to achieve safe transition back to home care.  This patient needs rehab services for at least 5 days/week and skilled nursing services daily to facilitate this transition.  Rehab is been requested as the most appropriate discharge option for this patient and is not felt to be custodial care as evidenced by independently functioning prior to admission but unable to do so currently due to her fracture.  Recovering well overall.   Consultants:   Ortho  Procedures:   ORIF of the right tibia on 05/29/2019  Antimicrobials:   Perioperative   Subjective: No acute events overnight but having lots of difficulty when working with  physical therapy given her recent surgery.  Lack of social support at home.  Lives alone at home.  Review of Systems Otherwise negative except as per HPI, including: General = no fevers, chills, dizziness, malaise,  fatigue HEENT/EYES = negative for pain, redness, loss of vision, double vision, blurred vision, loss of hearing, sore throat, hoarseness, dysphagia Cardiovascular= negative for chest pain, palpitation, murmurs, lower extremity swelling Respiratory/lungs= negative for shortness of breath, cough, hemoptysis, wheezing, mucus production Gastrointestinal= negative for nausea, vomiting,, abdominal pain, melena, hematemesis Genitourinary= negative for Dysuria, Hematuria, Change in Urinary Frequency MSK = Negative for arthralgia, myalgias, Back Pain, Joint swelling  Neurology= Negative for headache, seizures, numbness, tingling  Psychiatry= Negative for anxiety, depression, suicidal and homocidal ideation Allergy/Immunology= Medication/Food allergy as listed  Skin= Negative for Rash, lesions, ulcers, itching    Objective: Vitals:   05/31/19 1635 05/31/19 2200 06/01/19 0557 06/01/19 0803  BP: 126/74 114/64 130/71 134/71  Pulse: 79 82 80 82  Resp: 18  18 18   Temp: 98 F (36.7 C) 98.7 F (37.1 C) 98.5 F (36.9 C) 98.2 F (36.8 C)  TempSrc:  Oral Oral Oral  SpO2: 98%  98% 99%  Weight:      Height:        Intake/Output Summary (Last 24 hours) at 06/01/2019 1344 Last data filed at 06/01/2019 1026 Gross per 24 hour  Intake 480 ml  Output 300 ml  Net 180 ml   Filed Weights   05/28/19 1551 05/29/19 0055  Weight: 90.7 kg 86.6 kg    Examination: Constitutional: Not in acute distress Respiratory: Clear to auscultation bilaterally Cardiovascular: Normal sinus rhythm, no rubs Abdomen: Nontender nondistended good bowel sounds Musculoskeletal: Right lower extremity splint with dressing noted.  Wound VAC in place with sanguinous discharge. Skin: Right lower extremity dressing noted Neurologic: CN 2-12 grossly intact.  And nonfocal Psychiatric: Normal judgment and insight. Alert and oriented x 3. Normal mood.   Data Reviewed:   CBC: Recent Labs  Lab 05/28/19 1745 05/29/19 0545  05/30/19 0515 05/31/19 0428 06/01/19 0533  WBC 7.5 7.1 7.3 8.4 8.1  NEUTROABS 5.5  --   --   --   --   HGB 12.3 11.7* 11.5* 11.0* 10.6*  HCT 38.6 36.2 35.5* 34.6* 32.6*  MCV 101.8* 100.0 99.7 99.1 99.1  PLT 192 175 174 181 149   Basic Metabolic Panel: Recent Labs  Lab 05/28/19 1745 05/29/19 0545 05/30/19 0515 05/31/19 0428 06/01/19 0533  NA 141 139 140 137 139  K 3.9 3.4* 3.8 3.7 4.1  CL 109 108 107 103 104  CO2 26 25 23 22 27   GLUCOSE 123* 116* 119* 115* 111*  BUN 15 13 10 12 13   CREATININE 0.88 0.74 0.66 0.79 0.87  CALCIUM 9.5 9.3 9.3 9.2 9.3  MG  --  1.8 2.1 2.1 2.1   GFR: Estimated Creatinine Clearance: 49.5 mL/min (by C-G formula based on SCr of 0.87 mg/dL). Liver Function Tests: No results for input(s): AST, ALT, ALKPHOS, BILITOT, PROT, ALBUMIN in the last 168 hours. No results for input(s): LIPASE, AMYLASE in the last 168 hours. No results for input(s): AMMONIA in the last 168 hours. Coagulation Profile: Recent Labs  Lab 05/28/19 1745  INR 1.0   Cardiac Enzymes: No results for input(s): CKTOTAL, CKMB, CKMBINDEX, TROPONINI in the last 168 hours. BNP (last 3 results) No results for input(s): PROBNP in the last 8760 hours. HbA1C: No results for input(s): HGBA1C in the last 72 hours. CBG: Recent Labs  Lab  05/31/19 1202 05/31/19 1633 05/31/19 2107 06/01/19 0807 06/01/19 1142  GLUCAP 120* 114* 112* 99 147*   Lipid Profile: No results for input(s): CHOL, HDL, LDLCALC, TRIG, CHOLHDL, LDLDIRECT in the last 72 hours. Thyroid Function Tests: No results for input(s): TSH, T4TOTAL, FREET4, T3FREE, THYROIDAB in the last 72 hours. Anemia Panel: No results for input(s): VITAMINB12, FOLATE, FERRITIN, TIBC, IRON, RETICCTPCT in the last 72 hours. Sepsis Labs: No results for input(s): PROCALCITON, LATICACIDVEN in the last 168 hours.  Recent Results (from the past 240 hour(s))  SARS CORONAVIRUS 2 (TAT 6-24 HRS) Nasopharyngeal Nasopharyngeal Swab     Status: None    Collection Time: 05/28/19  5:46 PM   Specimen: Nasopharyngeal Swab  Result Value Ref Range Status   SARS Coronavirus 2 NEGATIVE NEGATIVE Final    Comment: (NOTE) SARS-CoV-2 target nucleic acids are NOT DETECTED. The SARS-CoV-2 RNA is generally detectable in upper and lower respiratory specimens during the acute phase of infection. Negative results do not preclude SARS-CoV-2 infection, do not rule out co-infections with other pathogens, and should not be used as the sole basis for treatment or other patient management decisions. Negative results must be combined with clinical observations, patient history, and epidemiological information. The expected result is Negative. Fact Sheet for Patients: HairSlick.no Fact Sheet for Healthcare Providers: quierodirigir.com This test is not yet approved or cleared by the [place] FDA and  has been authorized for detection and/or diagnosis of SARS-CoV-2 by FDA under an Emergency Use Authorization (EUA). This EUA will remain  in effect (meaning this test can be used) for the duration of the COVID-19 declaration under Section 56 4(b)(1) of the Act, 21 U.S.C. section 360bbb-3(b)(1), unless the authorization is terminated or revoked sooner. Performed at [facility], [address] [place], [place] [address]          Radiology Studies: No results found.      Scheduled Meds: . Chlorhexidine Gluconate Cloth  6 each Topical Daily  . docusate sodium  100 mg Oral BID  . enoxaparin (LOVENOX) injection  40 mg Subcutaneous Q24H  . hydrALAZINE  10 mg Intravenous Once  . insulin aspart  0-15 Units Subcutaneous TID WC  . insulin aspart  0-5 Units Subcutaneous QHS  . insulin aspart  3 Units Subcutaneous TID WC  . levothyroxine  88 mcg Oral QAC breakfast  . metoprolol succinate  50 mg Oral Daily  . pantoprazole  40 mg Oral Daily  . [START ON 06/02/2019] Vitamin D (Ergocalciferol)   50,000 Units Oral Q Wed   Continuous Infusions:    LOS: 4 days   Time spent= 35 mins    Verda Mehta Joline Maxcy, MD [facility]  If 7PM-7AM, please contact night-coverage  06/01/2019, 1:44 PM

## 2019-06-01 NOTE — Progress Notes (Signed)
   Subjective: 3 Days Post-Op Procedure(s) (LRB): OPEN REDUCTION INTERNAL FIXATION (ORIF) TIBIA/FIBULA FRACTURE (Right) Patient reports pain as 0 on 0-10 scale.   Patient is well, and has had no acute complaints or problems Denies any CP, SOB, ABD pain. We will continue with physical therapy today.   Objective: Vital signs in last 24 hours: Temp:  [98 F (36.7 C)-99.1 F (37.3 C)] 98.2 F (36.8 C) (01/12 0803) Pulse Rate:  [77-82] 82 (01/12 0803) Resp:  [16-18] 18 (01/12 0803) BP: (90-134)/(64-78) 134/71 (01/12 0803) SpO2:  [98 %-99 %] 99 % (01/12 0803)  Intake/Output from previous day: 01/11 0701 - 01/12 0700 In: 600 [P.O.:600] Out: 300 [Urine:300] Intake/Output this shift: No intake/output data recorded.  Recent Labs    05/30/19 0515 05/31/19 0428 06/01/19 0533  HGB 11.5* 11.0* 10.6*   Recent Labs    05/31/19 0428 06/01/19 0533  WBC 8.4 8.1  RBC 3.49* 3.29*  HCT 34.6* 32.6*  PLT 181 188   Recent Labs    05/31/19 0428 06/01/19 0533  NA 137 139  K 3.7 4.1  CL 103 104  CO2 22 27  BUN 12 13  CREATININE 0.79 0.87  GLUCOSE 115* 111*  CALCIUM 9.2 9.3   No results for input(s): LABPT, INR in the last 72 hours.  EXAM General - Patient is Alert, Appropriate and Oriented Extremity - Neurovascular intact Sensation intact distally Intact pulses distally Compartment soft  Splint intact.  Praveena negative pressure dressing intact with no drainage. Dressing - dressing C/D/I and no drainage Motor Function - intact, moving toes well on exam.   Past Medical History:  Diagnosis Date  . Arthritis   . Diabetes mellitus without complication (HCC)     Assessment/Plan:   3 Days Post-Op Procedure(s) (LRB): OPEN REDUCTION INTERNAL FIXATION (ORIF) TIBIA/FIBULA FRACTURE (Right) Principal Problem:   Fall Active Problems:   Artificial cardiac pacemaker   Essential (primary) hypertension   Adult hypothyroidism   Complete heart block, post-surgical (HCC)   Bundle  branch block, right   H/O aortic valve replacement   Diabetes mellitus, type 2 (HCC)   Tibia fracture   Left tibial fracture  Estimated body mass index is 29.04 kg/m as calculated from the following:   Height as of this encounter: 5\' 8"  (1.727 m).   Weight as of this encounter: 86.6 kg. Advance diet Up with therapy, NWB RLE Pain well controlled Labs and vital signs are stable Care management to assist with discharge.  Patient agreeable to SNF    follow up with [facility] 06/07/2019 for dressing change. Keep splint clean and dry. Lovenox 40 mg SQ daily x 14 days at discharge   DVT Prophylaxis - Lovenox Nonweightbearing right lower extremity   T. 06/09/2019, PA-C [facility] 06/01/2019, 9:53 AM

## 2019-06-01 NOTE — Progress Notes (Signed)
Physical Therapy Treatment Patient Details Name: Zoe [name] MRN: [id number] DOB: 1927/03/01 Today's Date: 06/01/2019    History of Present Illness Pt admitted for fall while carrying garbage sustaining proximal fibular fx and distal tibial fx on R LE now s/p ORIF on 05/29/19. History includes DM and arthritis.    PT Comments    Pt received resting in supine and agreeable to PT. Pt is progressing very slowly towards PT goals limited by ability to follow and maintain NWB on RLE. This session focused on gait training and progressing ambulation. Pt with much improved bed mobility and STS transfers. Pt still requires cuing for maintaining precautions during STS but is improving each session with less physical assist. Pt progressed to ambulating in room from one side of bed to the other with step to pattern and RW. Pt required max multimodal cuing to maintain NWB during ambulation. Pt continues to have difficulty completely true hop on left foot and holding right foot off the ground. pt able to ambulate and maintain precautions <50% of the time. Pt unable to take a true hop step with left foot mostly dragging foot or barely getting any ground clearance or forward progression. Pt appeared to be more aware of her deficits and need for assistance this afternoon session.  Pt reporting being wiped out after gait training. Pt will continue to benefit from acute PT to keep progressing functional mobility within precautions. Recommendation remains appropriate for STR following hospital discharge secondary to amount of assistance pt currently requires for mobility, inability to maintain precautions and that pts son/primary caregiver will not be home with her.   Follow Up Recommendations  SNF     Equipment Recommendations  Rolling walker with 5" wheels    Recommendations for Other Services       Precautions / Restrictions Precautions Precautions: Fall Restrictions Weight Bearing Restrictions: Yes RLE  Weight Bearing: Non weight bearing    Mobility  Bed Mobility Overal bed mobility: Needs Assistance Bed Mobility: Supine to Sit;Sit to Supine     Supine to sit: Supervision;HOB elevated Sit to supine: Supervision;HOB elevated   General bed mobility comments: supervision assist for bed mobility, pt with much improved technique, able to get EOB and return to supine/reposition with less effort than before and no physical assist, more confident during bed mobility  Transfers Overall transfer level: Needs assistance Equipment used: Rolling walker (2 wheeled) Transfers: Sit to/from Stand Sit to Stand: Min assist         General transfer comment: light min A to power to upright, good carryover for hand placement, still requiring min cuing for NWB on right foot  Ambulation/Gait Ambulation/Gait assistance: Min assist Gait Distance (Feet): 10 Feet Assistive device: Rolling walker (2 wheeled) Gait Pattern/deviations: Step-to pattern Gait velocity: sig decreased   General Gait Details: pt ambulated from one side of bed around to other, improved hopping on left foot however able to maintain NWB status on RLE <50% of time during ambulation, con to require max multimodal cuing for precautions, seemed to be more aware of precautions and her difficulty following this session, pt continues to have a lot of difficulty actually hopping on Left foot and getting ground clearance and keeping right foot lifted off ground   Stairs             Wheelchair Mobility    Modified Rankin (Stroke Patients Only)       Balance Overall balance assessment: Needs assistance Sitting-balance support: Feet supported Sitting balance-Leahy Scale: Good  Sitting balance - Comments: steady EOB   Standing balance support: Bilateral upper extremity supported;During functional activity Standing balance-Leahy Scale: Fair Standing balance comment: reliant on B UE support for standing and ambulation                             Cognition Arousal/Alertness: Awake/alert Behavior During Therapy: WFL for tasks assessed/performed Overall Cognitive Status: No family/caregiver present to determine baseline cognitive functioning                                 General Comments: continues to present with dec STM, mildly improved awareness to deficits and need for assistance      Exercises      General Comments General comments (skin integrity, edema, etc.): VSS      Pertinent Vitals/Pain Pain Assessment: No/denies pain    Home Living                      Prior Function            PT Goals (current goals can now be found in the care plan section) Progress towards PT goals: Progressing toward goals    Frequency    BID      PT Plan Current plan remains appropriate    Co-evaluation              AM-PAC PT "6 Clicks" Mobility   Outcome Measure  Help needed turning from your back to your side while in a flat bed without using bedrails?: A Little Help needed moving from lying on your back to sitting on the side of a flat bed without using bedrails?: A Little Help needed moving to and from a bed to a chair (including a wheelchair)?: A Little Help needed standing up from a chair using your arms (e.g., wheelchair or bedside chair)?: A Little Help needed to walk in hospital room?: A Lot Help needed climbing 3-5 steps with a railing? : Total 6 Click Score: 15    End of Session Equipment Utilized During Treatment: Gait belt Activity Tolerance: Patient tolerated treatment well Patient left: in bed;with call bell/phone within reach;with bed alarm set Nurse Communication: Mobility status PT Visit Diagnosis: Muscle weakness (generalized) (M62.81);Difficulty in walking, not elsewhere classified (R26.2);Unsteadiness on feet (R26.81);History of falling (Z91.81)     Time: 1333-1350 PT Time Calculation (min) (ACUTE ONLY): 17 min  Charges:  $Gait  Training: 8-22 mins                     Karoline Caldwell PT, DPT 4:56 PM,06/01/19 [phone]    Rogelio Winbush [name] 06/01/2019, 4:49 PM

## 2019-06-01 NOTE — Progress Notes (Signed)
Physical Therapy Treatment Patient Details Name: Zoe [name] MRN: [id number] DOB: [birth year] Today's Date: 06/01/2019    History of Present Illness Pt admitted for fall while carrying garbage sustaining proximal fibular fx and distal tibial fx on R LE now s/p ORIF on 05/29/19. History includes DM and arthritis.    PT Comments    Pt received resting in bed easily aroused and agreeable to PT. Pt denies any pain and when asked how she was feeling stated that she hoped she was okay. Pt seems to present with decreased cognitive functioning this morning and increased confusion. Pt alert and oriented however. Pt has decreased awareness to current functional status, safety awareness and awareness of deficits. Pt states she normally needs assistance from her son for daily activities but her son will not be there when she goes home because he is in rehab. Pt then reporting that she thinks she will be okay to dc home alone and can manage getting around her home, up the stairs at the backdoor and perform all daily activities independently. Pt presents with improved ability to get EOB this session. Pt continues to require min A to perform STS and stand pivot transfer to recliner with RW and max multimodal cuing for maintaining WBing precautions. Pt slightly improved ability to maintain precautions during transfer this morning as noted by therapist foot under pts R foot. Pt still not able to completely hop on left foot during transfers to allow for safe ambulation within precautions. Pt will benefit from continued acute PT to improve functional mobility and noted deficits. SNF remains recommendation following hospital discharge in order to maximize mobility within precautions, improve independence, decrease fall risk and caregiver burden.    Follow Up Recommendations  SNF     Equipment Recommendations  Rolling walker with 5" wheels    Recommendations for Other Services       Precautions / Restrictions  Precautions Precautions: Fall Restrictions Weight Bearing Restrictions: Yes RLE Weight Bearing: Non weight bearing    Mobility  Bed Mobility Overal bed mobility: Needs Assistance Bed Mobility: Supine to Sit     Supine to sit: Min guard;HOB elevated     General bed mobility comments: min guard to get EOB for safety, much improved effort and ability to get EOB without physical assist, less time and effort compared to yesterday  Transfers Overall transfer level: Needs assistance Equipment used: Rolling walker (2 wheeled) Transfers: Sit to/from Stand Sit to Stand: Min assist Stand pivot transfers: Min assist       General transfer comment: min A for STS and stand pivot transfer, improved technique with less physical assist, still requires max multimodal cuing for maintaining NWB precautions with transfers, slightly improved ability to perform hopping on left foot  Ambulation/Gait                 Stairs             Wheelchair Mobility    Modified Rankin (Stroke Patients Only)       Balance Overall balance assessment: Needs assistance Sitting-balance support: Feet supported Sitting balance-Leahy Scale: Good     Standing balance support: Bilateral upper extremity supported;During functional activity Standing balance-Leahy Scale: Fair                              Cognition Arousal/Alertness: Awake/alert Behavior During Therapy: WFL for tasks assessed/performed Overall Cognitive Status: No family/caregiver present to determine baseline cognitive functioning  General Comments: pt presenting with decreased cognition this session, pt presenting with decreased STM, decreased safety awareness and awareness of deficits, when asked how she is feeling pt stating "i hope im okay", pt confusing RLE and LLE during therex despite vc and tc      Exercises Total Joint Exercises Hip ABduction/ADduction:  AROM;Both;10 reps Straight Leg Raises: AROM;Both;10 reps Long Arc Quad: AROM;Both;10 reps Marching in Standing: AROM;Both;10 reps;Seated    General Comments General comments (skin integrity, edema, etc.): wound vac in place throughout session, VSS      Pertinent Vitals/Pain Pain Assessment: No/denies pain    Home Living                      Prior Function            PT Goals (current goals can now be found in the care plan section) Progress towards PT goals: Progressing toward goals    Frequency    BID      PT Plan Current plan remains appropriate    Co-evaluation              AM-PAC PT "6 Clicks" Mobility   Outcome Measure  Help needed turning from your back to your side while in a flat bed without using bedrails?: A Little Help needed moving from lying on your back to sitting on the side of a flat bed without using bedrails?: A Little Help needed moving to and from a bed to a chair (including a wheelchair)?: A Little Help needed standing up from a chair using your arms (e.g., wheelchair or bedside chair)?: A Little Help needed to walk in hospital room?: A Lot Help needed climbing 3-5 steps with a railing? : Total 6 Click Score: 15    End of Session Equipment Utilized During Treatment: Gait belt Activity Tolerance: Patient tolerated treatment well Patient left: in chair;with call bell/phone within reach;with chair alarm set;with nursing/sitter in room Nurse Communication: Mobility status PT Visit Diagnosis: Muscle weakness (generalized) (M62.81);Difficulty in walking, not elsewhere classified (R26.2);Pain;Unsteadiness on feet (R26.81);History of falling (Z91.81) Pain - Right/Left: Right Pain - part of body: Ankle and joints of foot     Time: 0177-9390 PT Time Calculation (min) (ACUTE ONLY): 18 min  Charges:  $Therapeutic Activity: 8-22 mins                     Karoline Caldwell PT, DPT 11:49 AM,06/01/19 [phone]    Zoe [name] Zoe [name] 06/01/2019, 11:42 AM

## 2019-06-02 DIAGNOSIS — D539 Nutritional anemia, unspecified: Secondary | ICD-10-CM

## 2019-06-02 DIAGNOSIS — S82301A Unspecified fracture of lower end of right tibia, initial encounter for closed fracture: Secondary | ICD-10-CM

## 2019-06-02 LAB — GLUCOSE, CAPILLARY
Glucose-Capillary: 125 mg/dL — ABNORMAL HIGH (ref 70–99)
Glucose-Capillary: 143 mg/dL — ABNORMAL HIGH (ref 70–99)
Glucose-Capillary: 125 mg/dL — ABNORMAL HIGH (ref 70–99)
Glucose-Capillary: 129 mg/dL — ABNORMAL HIGH (ref 70–99)

## 2019-06-02 LAB — BASIC METABOLIC PANEL
Anion gap: 7 (ref 5–15)
BUN: 13 mg/dL (ref 8–23)
CO2: 28 mmol/L (ref 22–32)
Calcium: 9.2 mg/dL (ref 8.9–10.3)
Chloride: 104 mmol/L (ref 98–111)
Creatinine, Ser: 0.75 mg/dL (ref 0.44–1.00)
GFR calc Af Amer: >60 mL/min (ref >60)
GFR calc non Af Amer: >60 mL/min (ref >60)
Glucose, Bld: 116 mg/dL — ABNORMAL HIGH (ref 70–99)
Potassium: 3.9 mmol/L (ref 3.5–5.1)
Sodium: 139 mmol/L (ref 135–145)

## 2019-06-02 LAB — CBC
HCT: 32.8 % — ABNORMAL LOW (ref 36.0–46.0)
Hemoglobin: 10.6 g/dL — ABNORMAL LOW (ref 12.0–15.0)
MCH: 32 pg (ref 26.0–34.0)
MCHC: 32.3 g/dL (ref 30.0–36.0)
MCV: 99.1 fL (ref 80.0–100.0)
Platelets: 204 10*3/uL (ref 150–400)
RBC: 3.31 MIL/uL — ABNORMAL LOW (ref 3.87–5.11)
RDW: 13.7 % (ref 11.5–15.5)
WBC: 8 10*3/uL (ref 4.0–10.5)
nRBC: 0 % (ref 0.0–0.2)

## 2019-06-02 LAB — MAGNESIUM: Magnesium: 2 mg/dL (ref 1.7–2.4)

## 2019-06-02 MED ORDER — OXYCODONE HCL 5 MG PO TABS: 5.0000 mg | ORAL_TABLET | ORAL | 0 refills | Status: DC | PRN

## 2019-06-02 NOTE — Progress Notes (Signed)
Subjective: 4 Days Post-Op Procedure(s) (LRB): OPEN REDUCTION INTERNAL FIXATION (ORIF) TIBIA/FIBULA FRACTURE (Right) Patient reports pain as 0 on 0-10 scale.   Patient is well, and has had no acute complaints or problems Denies any CP, SOB, ABD pain. We will continue with physical therapy today.  Patient reports that she is passing gas and has had a BM.  Objective: Vital signs in last 24 hours: Temp:  [98 F (36.7 C)-98.6 F (37 C)] 98.4 F (36.9 C) (01/13 0751) Pulse Rate:  [81-85] 81 (01/13 0751) Resp:  [17] 17 (01/12 2349) BP: (127-136)/(68-74) 136/68 (01/13 0751) SpO2:  [95 %-100 %] 96 % (01/13 0751)  Intake/Output from previous day: 01/12 0701 - 01/13 0700 In: 480 [P.O.:480] Out: 350 [Urine:350] Intake/Output this shift: No intake/output data recorded.  Recent Labs    05/31/19 0428 06/01/19 0533 06/02/19 0413  HGB 11.0* 10.6* 10.6*   Recent Labs    06/01/19 0533 06/02/19 0413  WBC 8.1 8.0  RBC 3.29* 3.31*  HCT 32.6* 32.8*  PLT 188 204   Recent Labs    06/01/19 0533 06/02/19 0413  NA 139 139  K 4.1 3.9  CL 104 104  CO2 27 28  BUN 13 13  CREATININE 0.87 0.75  GLUCOSE 111* 116*  CALCIUM 9.3 9.2   No results for input(s): LABPT, INR in the last 72 hours.  EXAM General - Patient is Alert, Appropriate and Oriented Extremity - Neurovascular intact Sensation intact distally Intact pulses distally Compartment soft  Splint intact.  Praveena negative pressure dressing intact with no drainage. Dressing - dressing C/D/I and no drainage Motor Function - intact, moving toes well on exam.  Intact to light touch to the dorsal and volar aspect of the toes.  Cap refill intact.  Past Medical History:  Diagnosis Date  . Arthritis   . Diabetes mellitus without complication (HCC)     Assessment/Plan:   4 Days Post-Op Procedure(s) (LRB): OPEN REDUCTION INTERNAL FIXATION (ORIF) TIBIA/FIBULA FRACTURE (Right) Principal Problem:   Fall Active Problems:  Artificial cardiac pacemaker   Essential (primary) hypertension   Adult hypothyroidism   Complete heart block, post-surgical (HCC)   Bundle branch block, right   H/O aortic valve replacement   Diabetes mellitus, type 2 (HCC)   Tibia fracture   Left tibial fracture  Estimated body mass index is 29.04 kg/m as calculated from the following:   Height as of this encounter: 5\' 8"  (1.727 m).   Weight as of this encounter: 86.6 kg. Advance diet Up with therapy, NWB RLE  Pain well controlled.  Continue with therapy today. Labs and vital signs are stable Care management to assist with discharge.  Patient agreeable to SNF   Follow up with [facility] 06/07/2019 for dressing change. Keep splint clean and dry. Lovenox 40 mg SQ daily x 14 days at discharge  DVT Prophylaxis - Lovenox Nonweightbearing right lower extremity  J. 06/09/2019, PA-C [facility] 06/02/2019, 8:05 AM

## 2019-06-02 NOTE — Care Management Important Message (Signed)
Important Message  Patient Details  Name: Zoe [name] MRN: [id number] Date of Birth: Jul 18, 1926   Medicare Important Message Given:  Yes     Olegario Messier A Vernee Baines 06/02/2019, 10:57 AM

## 2019-06-02 NOTE — Progress Notes (Signed)
PROGRESS NOTE    Megean Fabio [facility]  [id number]:[id number] DOB: August 06, 1926 DOA: 05/28/2019 PCP: Patient, No Pcp Per      Assessment & Plan:   Principal Problem:   Fall Active Problems:   Artificial cardiac pacemaker   Essential (primary) hypertension   Adult hypothyroidism   Complete heart block, post-surgical (HCC)   Bundle branch block, right   H/O aortic valve replacement   Diabetes mellitus, type 2 (HCC)   Tibia fracture   Left tibial fracture   Right tibial/fibular fracture: s/p ORIF of the right tibia on 05/29/2019. Tramadol prn for pain.  Encourage incentive spirometery.  None weightbearing as tolerated right lower extremity.  Wound VAC in place which will be changed to Praveena at the time of discharge. PT OT recommending SNF. COVID-19 neg. Follow-up outpatient orthopedic 1/18 for dressing change.  Maintain splint clean and dry.  Lovenox subcu for total of 14 days  History of high degree heart block: s/p pacemaker. History of aortic valve replacement/failed TAVR    Hypothyroidism: continue on levothyroxine   Essential hypertension: continue on metoprolol  Diabetes mellitus type 2: continue on aspart 3 units TID, SSI w/ accuchecks   Macrocytic anemia: H&H are stable. Will check B12 & folate level    DVT prophylaxis: lovenox Code Status: full Family Communication: Disposition Plan:   Consultants:   Ortho surg   Procedures:  S/p ORIF of the right tibia on 05/29/2019   Antimicrobials: n/a   Subjective: Pt c/o right leg pain  Objective: Vitals:   06/01/19 0803 06/01/19 1526 06/01/19 2349 06/02/19 0751  BP: 134/71 130/74 127/71 136/68  Pulse: 82 85 83 81  Resp: 18 17 17    Temp: 98.2 F (36.8 C) 98 F (36.7 C) 98.6 F (37 C) 98.4 F (36.9 C)  TempSrc: Oral Oral Oral Oral  SpO2: 99% 100% 95% 96%  Weight:      Height:        Intake/Output Summary (Last 24 hours) at 06/02/2019 0753 Last data filed at 06/02/2019 0500 Gross per 24 hour  Intake 480 ml    Output 350 ml  Net 130 ml   Filed Weights   05/28/19 1551 05/29/19 0055  Weight: 90.7 kg 86.6 kg    Examination:  General exam: Appears calm and comfortable  Respiratory system: Clear to auscultation. Respiratory effort normal. Cardiovascular system: S1 & S2 +. No gallops or clicks. Right leg is dressed & dressing is C/D/I Gastrointestinal system: Abdomen is nondistended, soft and nontender. Normal bowel sounds heard. Central nervous system: Alert and oriented. Moves all 4 extremities  Psychiatry: Judgement and insight appear normal. Mood & affect appropriate.     Data Reviewed: I have personally reviewed following labs and imaging studies  CBC: Recent Labs  Lab 05/28/19 1745 05/29/19 0545 05/30/19 0515 05/31/19 0428 06/01/19 0533 06/02/19 0413  WBC 7.5 7.1 7.3 8.4 8.1 8.0  NEUTROABS 5.5  --   --   --   --   --   HGB 12.3 11.7* 11.5* 11.0* 10.6* 10.6*  HCT 38.6 36.2 35.5* 34.6* 32.6* 32.8*  MCV 101.8* 100.0 99.7 99.1 99.1 99.1  PLT 192 175 174 181 188 204   Basic Metabolic Panel: Recent Labs  Lab 05/29/19 0545 05/30/19 0515 05/31/19 0428 06/01/19 0533 06/02/19 0413  NA 139 140 137 139 139  K 3.4* 3.8 3.7 4.1 3.9  CL 108 107 103 104 104  CO2 25 23 22 27 28   GLUCOSE 116* 119* 115* 111* 116*  BUN 13  10 12 13 13   CREATININE 0.74 0.66 0.79 0.87 0.75  CALCIUM 9.3 9.3 9.2 9.3 9.2  MG 1.8 2.1 2.1 2.1 2.0   GFR: Estimated Creatinine Clearance: 53.9 mL/min (by C-G formula based on SCr of 0.75 mg/dL). Liver Function Tests: No results for input(s): AST, ALT, ALKPHOS, BILITOT, PROT, ALBUMIN in the last 168 hours. No results for input(s): LIPASE, AMYLASE in the last 168 hours. No results for input(s): AMMONIA in the last 168 hours. Coagulation Profile: Recent Labs  Lab 05/28/19 1745  INR 1.0   Cardiac Enzymes: No results for input(s): CKTOTAL, CKMB, CKMBINDEX, TROPONINI in the last 168 hours. BNP (last 3 results) No results for input(s): PROBNP in the last  8760 hours. HbA1C: No results for input(s): HGBA1C in the last 72 hours. CBG: Recent Labs  Lab 05/31/19 2107 06/01/19 0807 06/01/19 1142 06/01/19 1710 06/01/19 2124  GLUCAP 112* 99 147* 169* 98   Lipid Profile: No results for input(s): CHOL, HDL, LDLCALC, TRIG, CHOLHDL, LDLDIRECT in the last 72 hours. Thyroid Function Tests: No results for input(s): TSH, T4TOTAL, FREET4, T3FREE, THYROIDAB in the last 72 hours. Anemia Panel: No results for input(s): VITAMINB12, FOLATE, FERRITIN, TIBC, IRON, RETICCTPCT in the last 72 hours. Sepsis Labs: No results for input(s): PROCALCITON, LATICACIDVEN in the last 168 hours.  Recent Results (from the past 240 hour(s))  SARS CORONAVIRUS 2 (TAT 6-24 HRS) Nasopharyngeal Nasopharyngeal Swab     Status: None   Collection Time: 05/28/19  5:46 PM   Specimen: Nasopharyngeal Swab  Result Value Ref Range Status   SARS Coronavirus 2 NEGATIVE NEGATIVE Final    Comment: (NOTE) SARS-CoV-2 target nucleic acids are NOT DETECTED. The SARS-CoV-2 RNA is generally detectable in upper and lower respiratory specimens during the acute phase of infection. Negative results do not preclude SARS-CoV-2 infection, do not rule out co-infections with other pathogens, and should not be used as the sole basis for treatment or other patient management decisions. Negative results must be combined with clinical observations, patient history, and epidemiological information. The expected result is Negative. Fact Sheet for Patients: SugarRoll.be Fact Sheet for Healthcare Providers: https://www.woods-mathews.com/ This test is not yet approved or cleared by the [place] FDA and  has been authorized for detection and/or diagnosis of SARS-CoV-2 by FDA under an Emergency Use Authorization (EUA). This EUA will remain  in effect (meaning this test can be used) for the duration of the COVID-19 declaration under Section 56 4(b)(1) of the  Act, 21 U.S.C. section 360bbb-3(b)(1), unless the authorization is terminated or revoked sooner. Performed at [facility], [place] [address] [place], [place] [address]   SARS CORONAVIRUS 2 (TAT 6-24 HRS) Nasopharyngeal Nasopharyngeal Swab     Status: None   Collection Time: 06/01/19  2:22 PM   Specimen: Nasopharyngeal Swab  Result Value Ref Range Status   SARS Coronavirus 2 NEGATIVE NEGATIVE Final    Comment: (NOTE) SARS-CoV-2 target nucleic acids are NOT DETECTED. The SARS-CoV-2 RNA is generally detectable in upper and lower respiratory specimens during the acute phase of infection. Negative results do not preclude SARS-CoV-2 infection, do not rule out co-infections with other pathogens, and should not be used as the sole basis for treatment or other patient management decisions. Negative results must be combined with clinical observations, patient history, and epidemiological information. The expected result is Negative. Fact Sheet for Patients: SugarRoll.be Fact Sheet for Healthcare Providers: https://www.woods-mathews.com/ This test is not yet approved or cleared by the [place] FDA and  has been  authorized for detection and/or diagnosis of SARS-CoV-2 by FDA under an Emergency Use Authorization (EUA). This EUA will remain  in effect (meaning this test can be used) for the duration of the COVID-19 declaration under Section 56 4(b)(1) of the Act, 21 U.S.C. section 360bbb-3(b)(1), unless the authorization is terminated or revoked sooner. Performed at [facility], [address], [place], [place] [address]          Radiology Studies: No results found.      Scheduled Meds: . Chlorhexidine Gluconate Cloth  6 each Topical Daily  . docusate sodium  100 mg Oral BID  . enoxaparin (LOVENOX) injection  40 mg Subcutaneous Q24H  . hydrALAZINE  10 mg Intravenous Once  . insulin aspart  0-15 Units Subcutaneous TID WC    . insulin aspart  0-5 Units Subcutaneous QHS  . insulin aspart  3 Units Subcutaneous TID WC  . levothyroxine  88 mcg Oral QAC breakfast  . metoprolol succinate  50 mg Oral Daily  . pantoprazole  40 mg Oral Daily  . Vitamin D (Ergocalciferol)  50,000 Units Oral Q Wed   Continuous Infusions:   LOS: 5 days    Time spent: 31 mins     Charise Killian, MD [facility] Pager [phone]  If 7PM-7AM, please contact night-coverage [email or web address] Password [facility] 06/02/2019, 7:53 AM

## 2019-06-02 NOTE — TOC Initial Note (Signed)
Transition of Care [facility]) - Initial/Assessment Note    Patient Details  Name: Zoe [name] MRN: [id number] Date of Birth: Jul 11, 1926  Transition of Care [facility]) CM/SW Contact:    Barrie Dunker, RN Phone Number: 06/02/2019, 1:52 PM  Clinical Narrative:                  Patient lives at home with her son, however he is in SNF at the moment, she is agreeable to go to SNF for rehab Bedsearch sent, Will review once obtained       Patient Goals and CMS Choice        Expected Discharge Plan and Services                                                Prior Living Arrangements/Services                       Activities of Daily Living Home Assistive Devices/Equipment: None ADL Screening (condition at time of admission) Patient's cognitive ability adequate to safely complete daily activities?: Yes Is the patient deaf or have difficulty hearing?: No Does the patient have difficulty seeing, even when wearing glasses/contacts?: No Does the patient have difficulty concentrating, remembering, or making decisions?: No Patient able to express need for assistance with ADLs?: Yes Does the patient have difficulty dressing or bathing?: No Independently performs ADLs?: Yes (appropriate for developmental age) Does the patient have difficulty walking or climbing stairs?: Yes Weakness of Legs: None Weakness of Arms/Hands: None  Permission Sought/Granted                  Emotional Assessment              Admission diagnosis:  Left tibial fracture [S82.202A] [place] fracture of fibula, right, closed, initial encounter [S82.861A] Type 2 diabetes mellitus without complication, without long-term current use of insulin (HCC) [E11.9] Closed fracture of distal end of right tibia, unspecified fracture morphology, initial encounter [S82.301A] Patient Active Problem List   Diagnosis Date Noted  . Macrocytic anemia   . Fall 05/28/2019  . Tibia fracture  05/28/2019  . Left tibial fracture 05/28/2019  . Essential (primary) hypertension 05/30/2014  . Combined fat and carbohydrate induced hyperlipemia 05/30/2014  . Diabetes mellitus, type 2 (HCC) 05/30/2014  . Artificial cardiac pacemaker 03/11/2014  . H/O aortic valve replacement 03/01/2014  . Complete heart block, post-surgical (HCC) 02/18/2014  . Bundle branch block, right 09/28/2013  . AI (aortic incompetence) 09/24/2013  . Aortic heart valve narrowing 09/24/2013  . Diabetes (HCC) 09/24/2013  . Breath shortness 09/24/2013  . BP (high blood pressure) 09/24/2013  . Adult hypothyroidism 09/24/2013  . MI (mitral incompetence) 09/24/2013  . Arthritis, degenerative 09/24/2013   PCP:  Patient, No Pcp Per Pharmacy:   [facility] - [place], [place] - [address] [place] [address] Phone: [phone] Fax: [phone]     Social Determinants of Health (SDOH) Interventions    Readmission Risk Interventions No flowsheet data found.

## 2019-06-02 NOTE — Progress Notes (Signed)
Physical Therapy Treatment Patient Details Name: Zoe [name] MRN: [id number] DOB: 02-02-1927 Today's Date: 06/02/2019    History of Present Illness Pt admitted for fall while carrying garbage sustaining proximal fibular fx and distal tibial fx on R LE now s/p ORIF on 05/29/19. History includes DM and arthritis.    PT Comments    Pt in bed upon arrival and agreeable to PT. Pt denying any pain. Pt worked on further gait training this afternoon. Slightly improved ability to maintain NWB with ambulation in the room however once beginning to fatigue unable to maintain and putting some WBing through RLE despite max cuing. Pt requires max multimodal cuing during ambulation for maintaining WBing status, RW mgt, and technique. Pt progressed to tolerating static standing without UE support to perform self care after toileting and then to wash hands at sink. Pt very fatigued end of session. Pt is progressing slowly towards PT goals. Pt continuing to have a lot of difficulty with ambulation and maintaining precautions. Pt unable to completely hop on LLE for ambulation. SNF remains appropriate.     Follow Up Recommendations  SNF     Equipment Recommendations  Rolling walker with 5" wheels    Recommendations for Other Services       Precautions / Restrictions Precautions Precautions: Fall Restrictions Weight Bearing Restrictions: Yes RLE Weight Bearing: Non weight bearing    Mobility  Bed Mobility Overal bed mobility: Needs Assistance Bed Mobility: Supine to Sit;Sit to Supine     Supine to sit: Supervision Sit to supine: Min assist   General bed mobility comments: supervision assist to get EOB, light min A for LE advancement returning to supine end of session  Transfers Overall transfer level: Needs assistance Equipment used: Rolling walker (2 wheeled) Transfers: Sit to/from Stand Sit to Stand: Min assist;From elevated surface Stand pivot transfers: Min assist;Min guard        General transfer comment: min A to rise from elevated bed, cuing required for hand placement, x2 trials requiring slightly more physical assist second trial, continues to required min A and frequent multimodal cuing for RW mgt, technique and maintaining NWB,  Ambulation/Gait Ambulation/Gait assistance: Min assist Gait Distance (Feet): 10 Feet Assistive device: Rolling walker (2 wheeled) Gait Pattern/deviations: Step-to pattern Gait velocity: sig decreased   General Gait Details: pt ambulated within room around bed to other side stopping halfway to use BSC, pt able to increase number of left foot hops however still greatly struggles with clearance and forward advancement with the hop, maintains NWB ~50% of the time, pt noted to have used RLE a couple times when fatiguing despite max cuing, pt requires max vc for maintaining RW closer to her   Stairs             Wheelchair Mobility    Modified Rankin (Stroke Patients Only)       Balance Overall balance assessment: Needs assistance Sitting-balance support: Feet supported Sitting balance-Leahy Scale: Good Sitting balance - Comments: steady EOB   Standing balance support: Bilateral upper extremity supported;During functional activity Standing balance-Leahy Scale: Fair Standing balance comment: reliant on B UE support for ambulation, able to maintian standing balance without UE support for self care after toileting and to wash hands at sink with min guard A for safety                            Cognition Arousal/Alertness: Awake/alert Behavior During Therapy: [facility] for tasks assessed/performed Overall  Cognitive Status: No family/caregiver present to determine baseline cognitive functioning                                 General Comments: dec STM. slow processing, requires frequent cuing and redirection      Exercises Total Joint Exercises Hip ABduction/ADduction: AROM;Both;10 reps Straight Leg  Raises: AROM;Both;10 reps Long Arc Quad: AROM;Both;10 reps Marching in Standing: AROM;Both;10 reps;Seated    General Comments        Pertinent Vitals/Pain Pain Assessment: No/denies pain    Home Living                      Prior Function            PT Goals (current goals can now be found in the care plan section) Progress towards PT goals: Progressing toward goals    Frequency    BID      PT Plan Current plan remains appropriate    Co-evaluation              AM-PAC PT "6 Clicks" Mobility   Outcome Measure  Help needed turning from your back to your side while in a flat bed without using bedrails?: A Little Help needed moving from lying on your back to sitting on the side of a flat bed without using bedrails?: A Little Help needed moving to and from a bed to a chair (including a wheelchair)?: A Little Help needed standing up from a chair using your arms (e.g., wheelchair or bedside chair)?: A Little Help needed to walk in hospital room?: A Lot Help needed climbing 3-5 steps with a railing? : Total 6 Click Score: 15    End of Session Equipment Utilized During Treatment: Gait belt Activity Tolerance: Patient tolerated treatment well Patient left: in bed;with call bell/phone within reach;with bed alarm set Nurse Communication: Mobility status PT Visit Diagnosis: Muscle weakness (generalized) (M62.81);Difficulty in walking, not elsewhere classified (R26.2);Unsteadiness on feet (R26.81);History of falling (Z91.81)     Time: 4742-5956 PT Time Calculation (min) (ACUTE ONLY): 24 min  Charges:  $Gait Training: 8-22 mins $Therapeutic Activity: 8-22 mins                     Zachary George PT, DPT 2:14 PM,06/02/19 [phone]    [place] Drucilla Chalet 06/02/2019, 2:04 PM

## 2019-06-02 NOTE — [place] FL2 (Signed)
[place] MEDICAID FL2 LEVEL OF CARE SCREENING TOOL     IDENTIFICATION  Patient Name: Zoe [name] Birthdate: 08/25/1926 Sex: female Admission Date (Current Location): 05/28/2019  County and Medicaid Number:  [place]   Facility and Address:  [facility], [address], [place], [place] [address]      Provider Number: [id number]  Attending Physician Name and Address:  Williams, Jamiese M, MD  Relative Name and Phone Number:  Thomas son [phone]    Current Level of Care: Hospital Recommended Level of Care: Skilled Nursing Facility Prior Approval Number:    Date Approved/Denied:   PASRR Number: [id number]  Discharge Plan: SNF    Current Diagnoses: Patient Active Problem List   Diagnosis Date Noted  . Macrocytic anemia   . Fall 05/28/2019  . Tibia fracture 05/28/2019  . Left tibial fracture 05/28/2019  . Essential (primary) hypertension 05/30/2014  . Combined fat and carbohydrate induced hyperlipemia 05/30/2014  . Diabetes mellitus, type 2 (HCC) 05/30/2014  . Artificial cardiac pacemaker 03/11/2014  . H/O aortic valve replacement 03/01/2014  . Complete heart block, post-surgical (HCC) 02/18/2014  . Bundle branch block, right 09/28/2013  . AI (aortic incompetence) 09/24/2013  . Aortic heart valve narrowing 09/24/2013  . Diabetes (HCC) 09/24/2013  . Breath shortness 09/24/2013  . BP (high blood pressure) 09/24/2013  . Adult hypothyroidism 09/24/2013  . MI (mitral incompetence) 09/24/2013  . Arthritis, degenerative 09/24/2013    Orientation RESPIRATION BLADDER Height & Weight     Self, Time, Situation, Place  Normal Continent Weight: 86.6 kg Height:  5' 8" (172.7 cm)  BEHAVIORAL SYMPTOMS/MOOD NEUROLOGICAL BOWEL NUTRITION STATUS      Continent    AMBULATORY STATUS COMMUNICATION OF NEEDS Skin   Extensive Assist Verbally Surgical wounds                       Personal Care Assistance Level of Assistance  Bathing,  Dressing Bathing Assistance: Limited assistance   Dressing Assistance: Limited assistance     Functional Limitations Info             SPECIAL CARE FACTORS FREQUENCY  PT (By licensed PT)     PT Frequency: 5 times per week              Contractures Contractures Info: Not present    Additional Factors Info  Allergies, Code Status Code Status Info: full code Allergies Info: NKDA           Current Medications (06/02/2019):  This is the current hospital active medication list Current Facility-Administered Medications  Medication Dose Route Frequency Provider Last Rate Last Admin  . acetaminophen (TYLENOL) tablet 325-650 mg  325-650 mg Oral Q6H PRN Poggi, John J, MD      . bisacodyl (DULCOLAX) suppository 10 mg  10 mg Rectal Daily PRN Poggi, John J, MD      . Chlorhexidine Gluconate Cloth 2 % PADS 6 each  6 each Topical Daily Amin, Ankit Chirag, MD   6 each at 05/31/19 1047  . diphenhydrAMINE (BENADRYL) 12.5 MG/5ML elixir 12.5-25 mg  12.5-25 mg Oral Q4H PRN Poggi, John J, MD      . docusate sodium (COLACE) capsule 100 mg  100 mg Oral BID Poggi, John J, MD   100 mg at 06/02/19 0958  . enoxaparin (LOVENOX) injection 40 mg  40 mg Subcutaneous Q24H Poggi, John J, MD   40 mg at 06/02/19 0858  . hydrALAZINE (APRESOLINE) injection 10 mg    10 mg Intravenous Once Bodenheimer, Charles A, NP      . HYDROmorphone (DILAUDID) injection 0.25-0.5 mg  0.25-0.5 mg Intravenous Q4H PRN Poggi, Excell Seltzer, MD      . insulin aspart (novoLOG) injection 0-15 Units  0-15 Units Subcutaneous TID WC Poggi, Excell Seltzer, MD   2 Units at 06/02/19 1241  . insulin aspart (novoLOG) injection 0-5 Units  0-5 Units Subcutaneous QHS Poggi, Excell Seltzer, MD      . insulin aspart (novoLOG) injection 3 Units  3 Units Subcutaneous TID WC Poggi, Excell Seltzer, MD   3 Units at 06/02/19 1241  . ipratropium-albuterol (DUONEB) 0.5-2.5 (3) MG/3ML nebulizer solution 3 mL  3 mL Nebulization Q4H PRN Poggi, Excell Seltzer, MD      . levothyroxine (SYNTHROID)  tablet 88 mcg  88 mcg Oral QAC breakfast Christena Flake, MD   88 mcg at 06/02/19 0547  . magnesium hydroxide (MILK OF MAGNESIA) suspension 30 mL  30 mL Oral Daily PRN Poggi, Excell Seltzer, MD   30 mL at 06/01/19 0920  . metoCLOPramide (REGLAN) tablet 5-10 mg  5-10 mg Oral Q8H PRN Poggi, Excell Seltzer, MD       Or  . metoCLOPramide (REGLAN) injection 5-10 mg  5-10 mg Intravenous Q8H PRN Poggi, Excell Seltzer, MD      . metoprolol succinate (TOPROL-XL) 24 hr tablet 50 mg  50 mg Oral Daily Poggi, Excell Seltzer, MD   50 mg at 06/02/19 0958  . ondansetron (ZOFRAN) tablet 4 mg  4 mg Oral Q6H PRN Poggi, Excell Seltzer, MD       Or  . ondansetron (ZOFRAN) injection 4 mg  4 mg Intravenous Q6H PRN Poggi, Excell Seltzer, MD      . oxyCODONE (Oxy IR/ROXICODONE) immediate release tablet 5-10 mg  5-10 mg Oral Q4H PRN Poggi, Excell Seltzer, MD   10 mg at 05/31/19 1423  . pantoprazole (PROTONIX) EC tablet 40 mg  40 mg Oral Daily Poggi, Excell Seltzer, MD   40 mg at 06/02/19 0958  . senna-docusate (Senokot-S) tablet 1 tablet  1 tablet Oral QHS PRN Poggi, Excell Seltzer, MD   1 tablet at 05/31/19 2205  . sodium phosphate (FLEET) 7-19 GM/118ML enema 1 enema  1 enema Rectal Once PRN Poggi, Excell Seltzer, MD      . traMADol Janean Sark) tablet 50 mg  50 mg Oral Q6H PRN Poggi, Excell Seltzer, MD   50 mg at 06/01/19 1741  . Vitamin D (Ergocalciferol) (DRISDOL) capsule 50,000 Units  50,000 Units Oral Q Wed Poggi, Excell Seltzer, MD   50,000 Units at 06/02/19 1610     Discharge Medications: Please see discharge summary for a list of discharge medications.  Relevant Imaging Results:  Relevant Lab Results:   Additional Information SS# [id number]  Barrie Dunker, RN

## 2019-06-02 NOTE — NC FL2 (Signed)
[place] LEVEL OF CARE SCREENING TOOL     IDENTIFICATION  Patient Name: Zoe [name] [facility] Birthdate: February 22, 1927 Sex: female Admission Date (Current Location): 05/28/2019  [place] and [place] Number:  Engineering geologist and Address:  [facility], [address], [place], [place] [address]      Provider Number: [id number]  Attending Physician Name and Address:  Wyvonnia Dusky, MD  Relative Name and Phone Number:  Marcello Moores son [phone]    Current Level of Care: Hospital Recommended Level of Care: [place] Prior Approval Number:    Date Approved/Denied:   PASRR Number: [id number]  Discharge Plan: SNF    Current Diagnoses: Patient Active Problem List   Diagnosis Date Noted  . Macrocytic anemia   . Fall 05/28/2019  . Tibia fracture 05/28/2019  . Left tibial fracture 05/28/2019  . Essential (primary) hypertension 05/30/2014  . Combined fat and carbohydrate induced hyperlipemia 05/30/2014  . Diabetes mellitus, type 2 ([place]) 05/30/2014  . Artificial cardiac pacemaker 03/11/2014  . H/O aortic valve replacement 03/01/2014  . Complete heart block, post-surgical ([place]) 02/18/2014  . Bundle branch block, right 09/28/2013  . AI (aortic incompetence) 09/24/2013  . Aortic heart valve narrowing 09/24/2013  . Diabetes ([place]) 09/24/2013  . Breath shortness 09/24/2013  . BP (high blood pressure) 09/24/2013  . Adult hypothyroidism 09/24/2013  . MI (mitral incompetence) 09/24/2013  . Arthritis, degenerative 09/24/2013    Orientation RESPIRATION BLADDER Height & Weight     Self, Time, Situation, Place  Normal Continent Weight: 86.6 kg Height:  5\' 8"  (172.7 cm)  BEHAVIORAL SYMPTOMS/MOOD NEUROLOGICAL BOWEL NUTRITION STATUS      Continent    AMBULATORY STATUS COMMUNICATION OF NEEDS Skin   Extensive Assist Verbally Surgical wounds                       Personal Care Assistance Level of Assistance  Bathing,  Dressing Bathing Assistance: Limited assistance   Dressing Assistance: Limited assistance     Functional Limitations Info             SPECIAL CARE FACTORS FREQUENCY  PT (By licensed PT)     PT Frequency: 5 times per week              Contractures Contractures Info: Not present    Additional Factors Info  Allergies, Code Status Code Status Info: full code Allergies Info: NKDA           Current Medications (06/02/2019):  This is the current hospital active medication list Current Facility-Administered Medications  Medication Dose Route Frequency Provider Last Rate Last Admin  . acetaminophen (TYLENOL) tablet 325-650 mg  325-650 mg Oral Q6H PRN Poggi, Marshall Cork, MD      . bisacodyl (DULCOLAX) suppository 10 mg  10 mg Rectal Daily PRN Poggi, Marshall Cork, MD      . Chlorhexidine Gluconate Cloth 2 % PADS 6 each  6 each Topical Daily Damita Lack, MD   6 each at 05/31/19 1047  . diphenhydrAMINE (BENADRYL) 12.5 MG/5ML elixir 12.5-25 mg  12.5-25 mg Oral Q4H PRN Poggi, Marshall Cork, MD      . docusate sodium (COLACE) capsule 100 mg  100 mg Oral BID Corky Mull, MD   100 mg at 06/02/19 0958  . enoxaparin (LOVENOX) injection 40 mg  40 mg Subcutaneous Q24H Poggi, Marshall Cork, MD   40 mg at 06/02/19 0858  . hydrALAZINE (APRESOLINE) injection 10 mg  10 mg Intravenous Once Bodenheimer, Charles A, NP      . HYDROmorphone (DILAUDID) injection 0.25-0.5 mg  0.25-0.5 mg Intravenous Q4H PRN Poggi, Excell Seltzer, MD      . insulin aspart (novoLOG) injection 0-15 Units  0-15 Units Subcutaneous TID WC Poggi, Excell Seltzer, MD   2 Units at 06/02/19 1241  . insulin aspart (novoLOG) injection 0-5 Units  0-5 Units Subcutaneous QHS Poggi, Excell Seltzer, MD      . insulin aspart (novoLOG) injection 3 Units  3 Units Subcutaneous TID WC Poggi, Excell Seltzer, MD   3 Units at 06/02/19 1241  . ipratropium-albuterol (DUONEB) 0.5-2.5 (3) MG/3ML nebulizer solution 3 mL  3 mL Nebulization Q4H PRN Poggi, Excell Seltzer, MD      . levothyroxine (SYNTHROID)  tablet 88 mcg  88 mcg Oral QAC breakfast Christena Flake, MD   88 mcg at 06/02/19 0547  . magnesium hydroxide (MILK OF MAGNESIA) suspension 30 mL  30 mL Oral Daily PRN Poggi, Excell Seltzer, MD   30 mL at 06/01/19 0920  . metoCLOPramide (REGLAN) tablet 5-10 mg  5-10 mg Oral Q8H PRN Poggi, Excell Seltzer, MD       Or  . metoCLOPramide (REGLAN) injection 5-10 mg  5-10 mg Intravenous Q8H PRN Poggi, Excell Seltzer, MD      . metoprolol succinate (TOPROL-XL) 24 hr tablet 50 mg  50 mg Oral Daily Poggi, Excell Seltzer, MD   50 mg at 06/02/19 0958  . ondansetron (ZOFRAN) tablet 4 mg  4 mg Oral Q6H PRN Poggi, Excell Seltzer, MD       Or  . ondansetron (ZOFRAN) injection 4 mg  4 mg Intravenous Q6H PRN Poggi, Excell Seltzer, MD      . oxyCODONE (Oxy IR/ROXICODONE) immediate release tablet 5-10 mg  5-10 mg Oral Q4H PRN Poggi, Excell Seltzer, MD   10 mg at 05/31/19 1423  . pantoprazole (PROTONIX) EC tablet 40 mg  40 mg Oral Daily Poggi, Excell Seltzer, MD   40 mg at 06/02/19 0958  . senna-docusate (Senokot-S) tablet 1 tablet  1 tablet Oral QHS PRN Poggi, Excell Seltzer, MD   1 tablet at 05/31/19 2205  . sodium phosphate (FLEET) 7-19 GM/118ML enema 1 enema  1 enema Rectal Once PRN Poggi, Excell Seltzer, MD      . traMADol Janean Sark) tablet 50 mg  50 mg Oral Q6H PRN Poggi, Excell Seltzer, MD   50 mg at 06/01/19 1741  . Vitamin D (Ergocalciferol) (DRISDOL) capsule 50,000 Units  50,000 Units Oral Q Wed Poggi, Excell Seltzer, MD   50,000 Units at 06/02/19 1610     Discharge Medications: Please see discharge summary for a list of discharge medications.  Relevant Imaging Results:  Relevant Lab Results:   Additional Information SS# [id number]  Barrie Dunker, RN

## 2019-06-02 NOTE — Progress Notes (Signed)
Physical Therapy Treatment Patient Details Name: Zoe [name] MRN: [id number] DOB: 1926-12-30 Today's Date: 06/02/2019    History of Present Illness Pt admitted for fall while carrying garbage sustaining proximal fibular fx and distal tibial fx on R LE now s/p ORIF on 05/29/19. History includes DM and arthritis.    PT Comments    Pt received resting in bed having just finished breakfast. No complaints of pain throughout session. Pt appears more aware of deficits and level of assistance required for mobility as she is now agreeing to SNF following hospital discharge. Pt is progressing slowly towards PT goals continuing to be limiting in OOB mobility due to NWB on RLE. Pt initially attempt STS from lower bed surface with pt able to off weight bottom but unable to achieve full standing despite mod A from therapist. With bed elevated pt min guard-light min A to power to full standing. Improved maintenance of precautions during STS. Pt performed multiple stand pivot transfers from bed to recliner and then recliner to/from [facility]. Pt required vc/tc for hand placement and safe technique with little carryover. Slightly improved maintenance of NWB during pivot transfers. Pt continues to need acute PT for progress functional mobility safely within precautions and improve strength, ROM, balance and activity tolerance deficits. SNF remains appropriate.    Follow Up Recommendations  SNF     Equipment Recommendations  Rolling walker with 5" wheels    Recommendations for Other Services       Precautions / Restrictions Precautions Precautions: Fall Restrictions Weight Bearing Restrictions: Yes RLE Weight Bearing: Non weight bearing    Mobility  Bed Mobility Overal bed mobility: Needs Assistance Bed Mobility: Supine to Sit     Supine to sit: Supervision;HOB elevated     General bed mobility comments: supervision assist for bed mobility, no physical assist or cuing required this session to get  EOB  Transfers Overall transfer level: Needs assistance Equipment used: Rolling walker (2 wheeled) Transfers: Sit to/from Stand Sit to Stand: Min guard;From elevated surface;Min assist Stand pivot transfers: Min assist;Min guard       General transfer comment: min guard to light min A to rise to standing from elevated bed, initially attempted STS from bed in lower position with pt unable to rise despite mod A from therapist, pt continues to require vc for correct hand placement, min g for stand pivot transfer with multiple periods of min A for RW mgt and maintaining precautions, pt having increased difficulty with clearance during hopping on left foot, pt transferred bed to recliner and recliner to/from [facility]  Ambulation/Gait         Gait velocity: sig decreased   General Gait Details: pt able to hop a few steps forward, increased difficulty with left foot hopping and getting adequate ground clearance and forward advancement   Stairs             Wheelchair Mobility    Modified Rankin (Stroke Patients Only)       Balance Overall balance assessment: Needs assistance Sitting-balance support: Feet supported Sitting balance-Leahy Scale: Good     Standing balance support: Bilateral upper extremity supported;During functional activity                                Cognition Arousal/Alertness: Awake/alert Behavior During Therapy: WFL for tasks assessed/performed Overall Cognitive Status: No family/caregiver present to determine baseline cognitive functioning  Exercises Total Joint Exercises Hip ABduction/ADduction: AROM;Both;10 reps Straight Leg Raises: AROM;Both;10 reps Long Arc Quad: AROM;Both;10 reps Marching in Standing: AROM;Both;10 reps;Seated    General Comments        Pertinent Vitals/Pain Pain Assessment: No/denies pain    Home Living                      Prior Function             PT Goals (current goals can now be found in the care plan section) Progress towards PT goals: Progressing toward goals(slowly)    Frequency    BID      PT Plan Current plan remains appropriate    Co-evaluation              AM-PAC PT "6 Clicks" Mobility   Outcome Measure  Help needed turning from your back to your side while in a flat bed without using bedrails?: A Little Help needed moving from lying on your back to sitting on the side of a flat bed without using bedrails?: A Little Help needed moving to and from a bed to a chair (including a wheelchair)?: A Little Help needed standing up from a chair using your arms (e.g., wheelchair or bedside chair)?: A Little Help needed to walk in hospital room?: A Lot Help needed climbing 3-5 steps with a railing? : Total 6 Click Score: 15    End of Session Equipment Utilized During Treatment: Gait belt Activity Tolerance: Patient tolerated treatment well Patient left: in chair;with call bell/phone within reach Nurse Communication: Mobility status PT Visit Diagnosis: Muscle weakness (generalized) (M62.81);Difficulty in walking, not elsewhere classified (R26.2);Unsteadiness on feet (R26.81);History of falling (Z91.81)     Time: 5374-8270 PT Time Calculation (min) (ACUTE ONLY): 28 min  Charges:  $Therapeutic Exercise: 8-22 mins $Therapeutic Activity: 8-22 mins                     Donevin Sainsbury PT, DPT 11:22 AM,06/02/19 [phone]    Lovelle Deitrick Drucilla Chalet 06/02/2019, 11:17 AM

## 2019-06-03 LAB — BASIC METABOLIC PANEL
Anion gap: 10 (ref 5–15)
BUN: 10 mg/dL (ref 8–23)
CO2: 26 mmol/L (ref 22–32)
Calcium: 9.4 mg/dL (ref 8.9–10.3)
Chloride: 104 mmol/L (ref 98–111)
Creatinine, Ser: 0.66 mg/dL (ref 0.44–1.00)
GFR calc Af Amer: >60 mL/min (ref >60)
GFR calc non Af Amer: >60 mL/min (ref >60)
Glucose, Bld: 120 mg/dL — ABNORMAL HIGH (ref 70–99)
Potassium: 3.7 mmol/L (ref 3.5–5.1)
Sodium: 140 mmol/L (ref 135–145)

## 2019-06-03 LAB — CBC
HCT: 32.4 % — ABNORMAL LOW (ref 36.0–46.0)
Hemoglobin: 10.8 g/dL — ABNORMAL LOW (ref 12.0–15.0)
MCH: 32.5 pg (ref 26.0–34.0)
MCHC: 33.3 g/dL (ref 30.0–36.0)
MCV: 97.6 fL (ref 80.0–100.0)
Platelets: 220 10*3/uL (ref 150–400)
RBC: 3.32 MIL/uL — ABNORMAL LOW (ref 3.87–5.11)
RDW: 13.7 % (ref 11.5–15.5)
WBC: 7 10*3/uL (ref 4.0–10.5)
nRBC: 0 % (ref 0.0–0.2)

## 2019-06-03 LAB — GLUCOSE, CAPILLARY
Glucose-Capillary: 118 mg/dL — ABNORMAL HIGH (ref 70–99)
Glucose-Capillary: 149 mg/dL — ABNORMAL HIGH (ref 70–99)
Glucose-Capillary: 94 mg/dL (ref 70–99)
Glucose-Capillary: 108 mg/dL — ABNORMAL HIGH (ref 70–99)

## 2019-06-03 LAB — FOLATE: Folate: 14.9 ng/mL (ref >5.9)

## 2019-06-03 LAB — VITAMIN B12: Vitamin B-12: 218 pg/mL (ref 180–914)

## 2019-06-03 MED ORDER — CYANOCOBALAMIN 1000 MCG/ML IJ SOLN
1000.0000 ug | Freq: Once | INTRAMUSCULAR | Status: AC
  Administered 2019-06-03: 1000 ug via INTRAMUSCULAR
  Filled 2019-06-03: qty 1

## 2019-06-03 MED ORDER — ENOXAPARIN SODIUM 40 MG/0.4ML ~~LOC~~ SOLN: 40.0000 mg | SUBCUTANEOUS | 0 refills | Status: DC

## 2019-06-03 NOTE — Discharge Instructions (Signed)
Diet: As you were doing prior to hospitalization   Shower:  May shower but keep the wounds dry, use an occlusive plastic wrap, NO SOAKING IN TUB.  If the bandage gets wet, change with a clean dry gauze.  Dressing:  Remain in splint until first post-op appointment.   Activity:  Increase activity slowly as tolerated, but follow the weight bearing instructions below.  No lifting or driving for 6 weeks.  Weight Bearing:   Non-weightbearing to the right leg.  To prevent constipation: you may use a stool softener such as -  Colace (over the counter) 100 mg by mouth twice a day  Drink plenty of fluids (prune juice may be helpful) and high fiber foods Miralax (over the counter) for constipation as needed.    Itching:  If you experience itching with your medications, try taking only a single pain pill, or even half a pain pill at a time.  You may take up to 10 pain pills per day, and you can also use benadryl over the counter for itching or also to help with sleep.   Precautions:  If you experience chest pain or shortness of breath - call 911 immediately for transfer to the hospital emergency department!!  If you develop a fever greater that 101 F, purulent drainage from wound, increased redness or drainage from wound, or calf pain-Call [facility]                                              Follow- Up Appointment:  Please call for an appointment to be seen in 2 weeks at [facility]

## 2019-06-03 NOTE — Progress Notes (Signed)
PROGRESS NOTE    Zoe [name] [facility]  KYH:[id number] DOB: November 16, 1926 DOA: 05/28/2019 PCP: Patient, No Pcp Per      Assessment & Plan:   Principal Problem:   Fall Active Problems:   Artificial cardiac pacemaker   Essential (primary) hypertension   Adult hypothyroidism   Complete heart block, post-surgical (HCC)   Bundle branch block, right   H/O aortic valve replacement   Diabetes mellitus, type 2 (HCC)   Tibia fracture   Left tibial fracture   Macrocytic anemia   Right tibial/fibular fracture: s/p ORIF of the right tibia on 05/29/2019. Tramadol prn for pain.  Encourage incentive spirometery.  None weightbearing as tolerated right lower extremity.  Wound VAC in place which will be changed to [place]ve[place] at the time of discharge. PT OT recommending SNF. COVID-19 neg. Follow-up outpatient orthopedic 1/18 for dressing change.  Maintain splint clean and dry.  Lovenox subcu for total of 14 days  History of high degree heart block: s/p pacemaker. History of aortic valve replacement/failed TAVR    Hypothyroidism: continue on levothyroxine   Essential hypertension: continue on metoprolol  Diabetes mellitus type 2: continue on aspart 3 units TID, SSI w/ accuchecks   Macrocytic anemia: H&H are stable. B12 & folate are both WNL but B12 on lower end of normal so give B12 IM x1   DVT prophylaxis: lovenox Code Status: full Family Communication: Disposition Plan: will d/c to SNF once pt has a bed and insurance Berkley Harvey is done    Consultants:   Ortho surg   Procedures:  S/p ORIF of the right tibia on 05/29/2019   Antimicrobials: n/a   Subjective: Pt c/o right leg pain still but improved from day prior.  Objective: Vitals:   06/03/19 0003 06/03/19 0500 06/03/19 0527 06/03/19 0735  BP: 112/61  131/67 133/76  Pulse: 84  82 79  Resp: 16  14 18   Temp: 98 F (36.7 C)  98.2 F (36.8 C) 98.4 F (36.9 C)  TempSrc: Oral  Oral Oral  SpO2: 95%  94% 98%  Weight:  85.7 kg    Height:         Intake/Output Summary (Last 24 hours) at 06/03/2019 0742 Last data filed at 06/03/2019 0141 Gross per 24 hour  Intake 360 ml  Output 1100 ml  Net -740 ml   Filed Weights   05/28/19 1551 05/29/19 0055 06/03/19 0500  Weight: 90.7 kg 86.6 kg 85.7 kg    Examination:  General exam: Appears calm and comfortable  Respiratory system: Clear to auscultation. No wheezes, rales. Respiratory effort normal. Cardiovascular system: S1 & S2 +. No gallops or clicks. Right leg is dressed & dressing is C/D/I Gastrointestinal system: Abdomen is nondistended, soft and nontender. Normal bowel sounds heard. Central nervous system: Alert and oriented. Moves all 4 extremities  Psychiatry: Judgement and insight appear normal. Mood & affect appropriate.     Data Reviewed: I have personally reviewed following labs and imaging studies  CBC: Recent Labs  Lab 05/28/19 1745 05/29/19 0545 05/30/19 0515 05/31/19 0428 06/01/19 0533 06/02/19 0413 06/03/19 0505  WBC 7.5   < > 7.3 8.4 8.1 8.0 7.0  NEUTROABS 5.5  --   --   --   --   --   --   HGB 12.3   < > 11.5* 11.0* 10.6* 10.6* 10.8*  HCT 38.6   < > 35.5* 34.6* 32.6* 32.8* 32.4*  MCV 101.8*   < > 99.7 99.1 99.1 99.1 97.6  PLT 192   < >  174 181 188 204 220   < > = values in this interval not displayed.   Basic Metabolic Panel: Recent Labs  Lab 05/29/19 0545 05/29/19 0545 05/30/19 0515 05/31/19 0428 06/01/19 0533 06/02/19 0413 06/03/19 0505  NA 139   < > 140 137 139 139 140  K 3.4*   < > 3.8 3.7 4.1 3.9 3.7  CL 108   < > 107 103 104 104 104  CO2 25   < > 23 22 27 28 26   GLUCOSE 116*   < > 119* 115* 111* 116* 120*  BUN 13   < > 10 12 13 13 10   CREATININE 0.74   < > 0.66 0.79 0.87 0.75 0.66  CALCIUM 9.3   < > 9.3 9.2 9.3 9.2 9.4  MG 1.8  --  2.1 2.1 2.1 2.0  --    < > = values in this interval not displayed.   GFR: Estimated Creatinine Clearance: 53.6 mL/min (by C-G formula based on SCr of 0.66 mg/dL). Liver Function Tests: No results  for input(s): AST, ALT, ALKPHOS, BILITOT, PROT, ALBUMIN in the last 168 hours. No results for input(s): LIPASE, AMYLASE in the last 168 hours. No results for input(s): AMMONIA in the last 168 hours. Coagulation Profile: Recent Labs  Lab 05/28/19 1745  INR 1.0   Cardiac Enzymes: No results for input(s): CKTOTAL, CKMB, CKMBINDEX, TROPONINI in the last 168 hours. BNP (last 3 results) No results for input(s): PROBNP in the last 8760 hours. HbA1C: No results for input(s): HGBA1C in the last 72 hours. CBG: Recent Labs  Lab 06/02/19 0750 06/02/19 1140 06/02/19 1726 06/02/19 2035 06/03/19 0736  GLUCAP 125* 143* 125* 129* 118*   Lipid Profile: No results for input(s): CHOL, HDL, LDLCALC, TRIG, CHOLHDL, LDLDIRECT in the last 72 hours. Thyroid Function Tests: No results for input(s): TSH, T4TOTAL, FREET4, T3FREE, THYROIDAB in the last 72 hours. Anemia Panel: Recent Labs    06/03/19 0505  FOLATE 14.9   Sepsis Labs: No results for input(s): PROCALCITON, LATICACIDVEN in the last 168 hours.  Recent Results (from the past 240 hour(s))  SARS CORONAVIRUS 2 (TAT 6-24 HRS) Nasopharyngeal Nasopharyngeal Swab     Status: None   Collection Time: 05/28/19  5:46 PM   Specimen: Nasopharyngeal Swab  Result Value Ref Range Status   SARS Coronavirus 2 NEGATIVE NEGATIVE Final    Comment: (NOTE) SARS-CoV-2 target nucleic acids are NOT DETECTED. The SARS-CoV-2 RNA is generally detectable in upper and lower respiratory specimens during the acute phase of infection. Negative results do not preclude SARS-CoV-2 infection, do not rule out co-infections with other pathogens, and should not be used as the sole basis for treatment or other patient management decisions. Negative results must be combined with clinical observations, patient history, and epidemiological information. The expected result is Negative. Fact Sheet for Patients: SugarRoll.be Fact Sheet for  Healthcare Providers: https://www.woods-mathews.com/ This test is not yet approved or cleared by the [place] FDA and  has been authorized for detection and/or diagnosis of SARS-CoV-2 by FDA under an Emergency Use Authorization (EUA). This EUA will remain  in effect (meaning this test can be used) for the duration of the COVID-19 declaration under Section 56 4(b)(1) of the Act, 21 U.S.C. section 360bbb-3(b)(1), unless the authorization is terminated or revoked sooner. Performed at [facility], [employer] [address] [place], [place] [address]   SARS CORONAVIRUS 2 (TAT 6-24 HRS) Nasopharyngeal Nasopharyngeal Swab     Status: None   Collection  Time: 06/01/19  2:22 PM   Specimen: Nasopharyngeal Swab  Result Value Ref Range Status   SARS Coronavirus 2 NEGATIVE NEGATIVE Final    Comment: (NOTE) SARS-CoV-2 target nucleic acids are NOT DETECTED. The SARS-CoV-2 RNA is generally detectable in upper and lower respiratory specimens during the acute phase of infection. Negative results do not preclude SARS-CoV-2 infection, do not rule out co-infections with other pathogens, and should not be used as the sole basis for treatment or other patient management decisions. Negative results must be combined with clinical observations, patient history, and epidemiological information. The expected result is Negative. Fact Sheet for Patients: HairSlick.no Fact Sheet for Healthcare Providers: quierodirigir.com This test is not yet approved or cleared by the [place] FDA and  has been authorized for detection and/or diagnosis of SARS-CoV-2 by FDA under an Emergency Use Authorization (EUA). This EUA will remain  in effect (meaning this test can be used) for the duration of the COVID-19 declaration under Section 56 4(b)(1) of the Act, 21 U.S.C. section 360bbb-3(b)(1), unless the authorization is terminated or revoked  sooner. Performed at [facility], [address] [place], [place] [address]          Radiology Studies: No results found.      Scheduled Meds: . Chlorhexidine Gluconate Cloth  6 each Topical Daily  . docusate sodium  100 mg Oral BID  . enoxaparin (LOVENOX) injection  40 mg Subcutaneous Q24H  . hydrALAZINE  10 mg Intravenous Once  . insulin aspart  0-15 Units Subcutaneous TID WC  . insulin aspart  0-5 Units Subcutaneous QHS  . insulin aspart  3 Units Subcutaneous TID WC  . levothyroxine  88 mcg Oral QAC breakfast  . metoprolol succinate  50 mg Oral Daily  . pantoprazole  40 mg Oral Daily  . Vitamin D (Ergocalciferol)  50,000 Units Oral Q Wed   Continuous Infusions:   LOS: 6 days    Time spent: 30 mins     Charise Killian, MD [facility] Pager [phone]  If 7PM-7AM, please contact night-coverage [email or web address] Password [facility] 06/03/2019, 7:42 AM

## 2019-06-03 NOTE — TOC Progression Note (Signed)
Transition of Care [facility]) - Progression Note    Patient Details  Name: Zoe [name] MRN: [id number] Date of Birth: 09-13-1926  [facility]) CM/SW Contact  Barrie Dunker, RN Phone Number: 06/03/2019, 3:26 PM  Clinical Narrative:    Called the patient's son at [facility] and asked Tresa Endo to let him know that his mom is going to go to [facility] today to room [facility], Tresa Endo will let the son know        Expected Discharge Plan and Services           Expected Discharge Date: 06/03/19                                     Social Determinants of Health (SDOH) Interventions    Readmission Risk Interventions No flowsheet data found.

## 2019-06-03 NOTE — TOC Progression Note (Signed)
Transition of Care [facility]) - Progression Note    Patient Details  Name: Zoe [name] MRN: [id number] Date of Birth: 23-Nov-1926  Transition of Care [facility]) CM/SW Contact  Barrie Dunker, RN Phone Number: 06/03/2019, 12:04 PM  Clinical Narrative:    Barrie [facility]unker [facility] to start the insurance auth process, To go to [facility] [place],  Ref number [id number] Faxed clinical information to [phone]        Expected Discharge Plan and Services                                                 Social Determinants of Health (SDOH) Interventions    Readmission Risk Interventions No flowsheet data found.

## 2019-06-03 NOTE — Discharge Summary (Signed)
Physician Discharge Summary  [name] RUE:[id number]:[id number] DOB: 02-10-1927 DOA: 05/28/2019  PCP: Patient, No Pcp Per  Admit date: 05/28/2019 Discharge date: 06/03/2019  Admitted From: home Disposition: SNF  Recommendations for Outpatient Follow-up:  1. Follow up with PCP in 1-2 weeks 2. F/u [facility] ortho surgery on 06/07/19 for dressing change   Home Health: no  Equipment/Devices:   Discharge Condition: stable  CODE STATUS:full  Diet recommendation: Heart Healthy / Carb Modified  Brief/Interim Summary: HPI taken from Dr. Nelson ChimesAmin: Zoe [name] is a 86 y.o. female with medical history significant of high degree heart block status post pacemaker, aortic stenosis status post failed TAVR, diabetes mellitus type 2, HTN, hypothyroidism, osteoarthritis presented to the hospital after sustaining a fall with complains of right leg pain.  Patient was outside putting trash away when all of a sudden she buckled her knee and ended up falling causing her right knee pain she denied any chest pain, lightheadedness, palpitations or any other symptoms. In the ER patient was pretty much nonambulatory, routine labs were pending.  Orthopedic was consulted who recommended surgical intervention and admission to medical team.  When I saw the patient she was quite comfortable and did not have any complaints besides having difficulty walking.  Discussed with patient's RN at bedside as well regarding obtaining preop labs and EKG.  Hospital Course from Dr. Wilfred LacyJ. Krisy Dix 1/13-1/14/21: Pt was found to have a right tib/fib fracture and pt had ORIF on 05/29/19. Pt will f/u w/ [facility]o surgery on 06/07/19 for dressing changes. Maintain splint clean & dry. Continue on lovenox for 14 days total as per ortho surg. Nonweightbearing right lower extremity as per ortho surgery. Pt's pain as been pretty well controlled on po pain meds. Of note, pt did receive B12 IM x 1 as B12 level was on lower limits of normal and pt has macrocytic anemia.  PT saw the pt and recommended SNF   Discharge Diagnoses:  Principal Problem:   Fall Active Problems:   Artificial cardiac pacemaker   Essential (primary) hypertension   Adult hypothyroidism   Complete heart block, post-surgical (HCC)   Bundle branch block, right   H/O aortic valve replacement   Diabetes mellitus, type 2 (HCC)   Tibia fracture   Left tibial fracture   Macrocytic anemia    Discharge Instructions  Discharge Instructions    Diet - low sodium heart healthy   Complete by: As directed    Diet Carb Modified   Complete by: As directed    Discharge instructions   Complete by: As directed    F/u PCP in 1-2 weeks; F/u ortho surg w/in 1 week   Increase activity slowly   Complete by: As directed      Allergies as of 06/03/2019   No Known Allergies     Medication List    STOP taking these medications   oxycodone 5 MG capsule Commonly known as: OXY-IR Replaced by: oxyCODONE 5 MG immediate release tablet     TAKE these medications   acetaminophen 325 MG tablet Commonly known as: TYLENOL Take 650 mg by mouth every 6 (six) hours as needed for mild pain or headache.   enoxaparin 40 MG/0.4ML injection Commonly known as: LOVENOX Inject 0.4 mLs (40 mg total) into the skin daily for 14 days.   famotidine 20 MG tablet Commonly known as: PEPCID Take 20 mg by mouth 2 (two) times daily.   levothyroxine 88 MCG tablet Commonly known as: SYNTHROID Take 88 mcg by  mouth daily before breakfast. TAKE 1 TABLET DAILY ON AN EMPTY STOMACH WITH A GLASS OF WATER AT LEAST 30 TO 60 MINUTES BEFORE BREAKFAST   metoprolol succinate 50 MG 24 hr tablet Commonly known as: TOPROL-XL Take 50 mg by mouth daily. Take with or immediately following a meal.   omega-3 acid ethyl esters 1 g capsule Commonly known as: LOVAZA Take 1 g by mouth daily.   Osteo Bi-Flex Regular Strength 250-200 MG Tabs Generic drug: Glucosamine-Chondroitin Take 2 tablets by mouth daily.   oxyCODONE 5 MG  immediate release tablet Commonly known as: Oxy IR/ROXICODONE Take 1-2 tablets (5-10 mg total) by mouth every 4 (four) hours as needed for moderate pain (pain score 4-6). Replaces: oxycodone 5 MG capsule   pioglitazone 15 MG tablet Commonly known as: ACTOS Take 15 mg by mouth daily.   Vitamin D (Ergocalciferol) 1.25 MG (50000 UNIT) Caps capsule Commonly known as: DRISDOL Take 50,000 Units by mouth every Wednesday.            Durable Medical Equipment  (From admission, onward)         Start     Ordered   05/31/19 1206  For home use only DME Walker rolling  Once    Comments: Rolling walker with 5 inch wheels  Question Answer Comment  Walker: With 5 Inch Wheels   Patient needs a walker to treat with the following condition Right tibial fracture      05/31/19 1206          Contact information for follow-up providers    Anson [place], PA-C. Go on 06/07/2019.   Specialty: Physician Assistant Contact information: [address] [place] [address] [phone]            Contact information for after-discharge care    Destination    [facility] Preferred SNF .   Service: Skilled Nursing Contact information: [address] [place] [address] [phone]                 No Known Allergies  Consultations:  Ortho surgery   Procedures/Studies: DG Tibia/Fibula Right  Result Date: 05/28/2019 CLINICAL DATA:  Post reduction, fracture EXAM: RIGHT TIBIA AND FIBULA - 2 VIEW COMPARISON:  05/28/2019 FINDINGS: Acute comminuted fracture involving proximal shaft of the fibula with residual 1/2 shaft diameter anterior and less than 1/4 shaft diameter lateral displacement of distal fracture fragment, decreased compared to prior. Acute comminuted fracture involving the distal shaft and metaphysis of the tibia with residual 1/2 shaft diameter lateral and posterior displacement of distal fracture fragment,  also decreased compared to prior on the lateral view. 12 mm anteriorly displaced fracture fragment. Slight posterior angulation of the distal tibial fracture fragment. IMPRESSION: 1. Acute comminuted fracture involving proximal shaft of fibula with residual displacement but decreased compared to prior 2. Acute comminuted, displaced and slightly angulated distal tibial fracture, with decreased displacement on the lateral view. There may be slight increased lateral displacement on frontal view but comparison limited by rotated positioning on the previous exam Electronically Signed   By: Jasmine Pang M.D.   On: 05/28/2019 19:07   DG Tibia/Fibula Right  Result Date: 05/28/2019 CLINICAL DATA:  Recent fall with right leg pain EXAM: RIGHT TIBIA AND FIBULA - 2 VIEW COMPARISON:  03/17/2008 FINDINGS: Oblique fracture is noted through the proximal fibular shaft. Additionally an oblique comminuted fracture through the distal tibia involving the metaphysis is seen. Some rotational component laterally is noted  in the distal lower leg. Changes consistent with prior knee replacement are again seen. IMPRESSION: Proximal fibular and distal tibial fracture with some rotational component laterally. Electronically Signed   By: Alcide Clever M.D.   On: 05/28/2019 17:02   DG Ankle 2 Views Right  Result Date: 05/29/2019 CLINICAL DATA:  Right ankle ORIF EXAM: RIGHT ANKLE - 2 VIEW; DG C-ARM 1-60 MIN COMPARISON:  05/28/2019 FINDINGS: Plate and screw fixation noted across the distal tibial fracture. No hardware complicating feature. Anatomic alignment. IMPRESSION: Internal fixation.  No complicating feature. Electronically Signed   By: Charlett Nose M.D.   On: 05/29/2019 17:22   DG Chest Portable 1 View  Result Date: 05/28/2019 CLINICAL DATA:  Recent fall, initial encounter EXAM: PORTABLE CHEST 1 VIEW COMPARISON:  03/16/2008 FINDINGS: Cardiac shadow is within normal limits. Postsurgical changes are seen. Pacing device is noted.  Elevation of the right hemidiaphragm is again seen. Mild basilar atelectasis is noted bilaterally. No bony abnormality is seen. IMPRESSION: Mild bibasilar atelectasis. Electronically Signed   By: Alcide Clever M.D.   On: 05/28/2019 17:39   DG C-Arm 1-60 Min  Result Date: 05/29/2019 CLINICAL DATA:  Right ankle ORIF EXAM: RIGHT ANKLE - 2 VIEW; DG C-ARM 1-60 MIN COMPARISON:  05/28/2019 FINDINGS: Plate and screw fixation noted across the distal tibial fracture. No hardware complicating feature. Anatomic alignment. IMPRESSION: Internal fixation.  No complicating feature. Electronically Signed   By: Charlett Nose M.D.   On: 05/29/2019 17:22       Subjective: Pt c/o malaise   Discharge Exam: Vitals:   06/03/19 0735 06/03/19 1436  BP: 133/76 113/64  Pulse: 79 79  Resp: 18 18  Temp: 98.4 F (36.9 C) 98.5 F (36.9 C)  SpO2: 98% 97%   Vitals:   06/03/19 0500 06/03/19 0527 06/03/19 0735 06/03/19 1436  BP:  131/67 133/76 113/64  Pulse:  82 79 79  Resp:  14 18 18   Temp:  98.2 F (36.8 C) 98.4 F (36.9 C) 98.5 F (36.9 C)  TempSrc:  Oral Oral Oral  SpO2:  94% 98% 97%  Weight: 85.7 kg     Height:        General: Pt is alert, awake, not in acute distress Cardiovascular: S1/S2 +, no rubs, no gallops Respiratory: CTA bilaterally, no wheezing, no rhonchi Abdominal: Soft, NT, ND, bowel sounds + Extremities: RLE is dressed & dressing is C/D/I, no cyanosis    The results of significant diagnostics from this hospitalization (including imaging, microbiology, ancillary and laboratory) are listed below for reference.     Microbiology: Recent Results (from the past 240 hour(s))  SARS CORONAVIRUS 2 (TAT 6-24 HRS) Nasopharyngeal Nasopharyngeal Swab     Status: None   Collection Time: 05/28/19  5:46 PM   Specimen: Nasopharyngeal Swab  Result Value Ref Range Status   SARS Coronavirus 2 NEGATIVE NEGATIVE Final    Comment: (NOTE) SARS-CoV-2 target nucleic acids are NOT DETECTED. The SARS-CoV-2  RNA is generally detectable in upper and lower respiratory specimens during the acute phase of infection. Negative results do not preclude SARS-CoV-2 infection, do not rule out co-infections with other pathogens, and should not be used as the sole basis for treatment or other patient management decisions. Negative results must be combined with clinical observations, patient history, and epidemiological information. The expected result is Negative. Fact Sheet for Patients: 07/26/19 Fact Sheet for Healthcare Providers: HairSlick.no This test is not yet approved or cleared by the [place] FDA and  has been  authorized for detection and/or diagnosis of SARS-CoV-2 by FDA under an Emergency Use Authorization (EUA). This EUA will remain  in effect (meaning this test can be used) for the duration of the COVID-19 declaration under Section 56 4(b)(1) of the Act, 21 U.S.C. section 360bbb-3(b)(1), unless the authorization is terminated or revoked sooner. Performed at [facility], [place] [address] [place], [place] [address]   SARS CORONAVIRUS 2 (TAT 6-24 HRS) Nasopharyngeal Nasopharyngeal Swab     Status: None   Collection Time: 06/01/19  2:22 PM   Specimen: Nasopharyngeal Swab  Result Value Ref Range Status   SARS Coronavirus 2 NEGATIVE NEGATIVE Final    Comment: (NOTE) SARS-CoV-2 target nucleic acids are NOT DETECTED. The SARS-CoV-2 RNA is generally detectable in upper and lower respiratory specimens during the acute phase of infection. Negative results do not preclude SARS-CoV-2 infection, do not rule out co-infections with other pathogens, and should not be used as the sole basis for treatment or other patient management decisions. Negative results must be combined with clinical observations, patient history, and epidemiological information. The expected result is Negative. Fact Sheet for  Patients: SugarRoll.be Fact Sheet for Healthcare Providers: https://www.woods-mathews.com/ This test is not yet approved or cleared by the [place] FDA and  has been authorized for detection and/or diagnosis of SARS-CoV-2 by FDA under an Emergency Use Authorization (EUA). This EUA will remain  in effect (meaning this test can be used) for the duration of the COVID-19 declaration under Section 56 4(b)(1) of the Act, 21 U.S.C. section 360bbb-3(b)(1), unless the authorization is terminated or revoked sooner. Performed at [facility], [place] [address] [place], [place] [address]      Labs: BNP (last 3 results) No results for input(s): BNP in the last 8760 hours. Basic Metabolic Panel: Recent Labs  Lab 05/29/19 0545 05/29/19 0545 05/30/19 0515 05/31/19 0428 06/01/19 0533 06/02/19 0413 06/03/19 0505  NA 139   < > 140 137 139 139 140  K 3.4*   < > 3.8 3.7 4.1 3.9 3.7  CL 108   < > 107 103 104 104 104  CO2 25   < > 23 22 27 28 26   GLUCOSE 116*   < > 119* 115* 111* 116* 120*  BUN 13   < > 10 12 13 13 10   CREATININE 0.74   < > 0.66 0.79 0.87 0.75 0.66  CALCIUM 9.3   < > 9.3 9.2 9.3 9.2 9.4  MG 1.8  --  2.1 2.1 2.1 2.0  --    < > = values in this interval not displayed.   Liver Function Tests: No results for input(s): AST, ALT, ALKPHOS, BILITOT, PROT, ALBUMIN in the last 168 hours. No results for input(s): LIPASE, AMYLASE in the last 168 hours. No results for input(s): AMMONIA in the last 168 hours. CBC: Recent Labs  Lab 05/28/19 1745 05/29/19 0545 05/30/19 0515 05/31/19 0428 06/01/19 0533 06/02/19 0413 06/03/19 0505  WBC 7.5   < > 7.3 8.4 8.1 8.0 7.0  NEUTROABS 5.5  --   --   --   --   --   --   HGB 12.3   < > 11.5* 11.0* 10.6* 10.6* 10.8*  HCT 38.6   < > 35.5* 34.6* 32.6* 32.8* 32.4*  MCV 101.8*   < > 99.7 99.1 99.1 99.1 97.6  PLT 192   < > 174 181 188 204 220   < > = values in this interval not displayed.  Cardiac Enzymes: No results for input(s): CKTOTAL, CKMB, CKMBINDEX, TROPONINI in the last 168 hours. BNP: Invalid input(s): POCBNP CBG: Recent Labs  Lab 06/02/19 1140 06/02/19 1726 06/02/19 2035 06/03/19 0736 06/03/19 1301  GLUCAP 143* 125* 129* 118* 149*   D-Dimer No results for input(s): DDIMER in the last 72 hours. Hgb A1c No results for input(s): HGBA1C in the last 72 hours. Lipid Profile No results for input(s): CHOL, HDL, LDLCALC, TRIG, CHOLHDL, LDLDIRECT in the last 72 hours. Thyroid function studies No results for input(s): TSH, T4TOTAL, T3FREE, THYROIDAB in the last 72 hours.  Invalid input(s): FREET3 Anemia work up Recent Labs    06/03/19 0505  VITAMINB12 218  FOLATE 14.9   Urinalysis No results found for: COLORURINE, APPEARANCEUR, LABSPEC, PHURINE, GLUCOSEU, HGBUR, BILIRUBINUR, KETONESUR, PROTEINUR, UROBILINOGEN, NITRITE, LEUKOCYTESUR Sepsis Labs Invalid input(s): PROCALCITONIN,  WBC,  LACTICIDVEN Microbiology Recent Results (from the past 240 hour(s))  SARS CORONAVIRUS 2 (TAT 6-24 HRS) Nasopharyngeal Nasopharyngeal Swab     Status: None   Collection Time: 05/28/19  5:46 PM   Specimen: Nasopharyngeal Swab  Result Value Ref Range Status   SARS Coronavirus 2 NEGATIVE NEGATIVE Final    Comment: (NOTE) SARS-CoV-2 target nucleic acids are NOT DETECTED. The SARS-CoV-2 RNA is generally detectable in upper and lower respiratory specimens during the acute phase of infection. Negative results do not preclude SARS-CoV-2 infection, do not rule out co-infections with other pathogens, and should not be used as the sole basis for treatment or other patient management decisions. Negative results must be combined with clinical observations, patient history, and epidemiological information. The expected result is Negative. Fact Sheet for Patients: HairSlick.no Fact Sheet for Healthcare  Providers: quierodirigir.com This test is not yet approved or cleared by the [place] FDA and  has been authorized for detection and/or diagnosis of SARS-CoV-2 by FDA under an Emergency Use Authorization (EUA). This EUA will remain  in effect (meaning this test can be used) for the duration of the COVID-19 declaration under Section 56 4(b)(1) of the Act, 21 U.S.C. section 360bbb-3(b)(1), unless the authorization is terminated or revoked sooner. Performed at [facility], [address] [place], [place] [address]   SARS CORONAVIRUS 2 (TAT 6-24 HRS) Nasopharyngeal Nasopharyngeal Swab     Status: None   Collection Time: 06/01/19  2:22 PM   Specimen: Nasopharyngeal Swab  Result Value Ref Range Status   SARS Coronavirus 2 NEGATIVE NEGATIVE Final    Comment: (NOTE) SARS-CoV-2 target nucleic acids are NOT DETECTED. The SARS-CoV-2 RNA is generally detectable in upper and lower respiratory specimens during the acute phase of infection. Negative results do not preclude SARS-CoV-2 infection, do not rule out co-infections with other pathogens, and should not be used as the sole basis for treatment or other patient management decisions. Negative results must be combined with clinical observations, patient history, and epidemiological information. The expected result is Negative. Fact Sheet for Patients: HairSlick.no Fact Sheet for Healthcare Providers: quierodirigir.com This test is not yet approved or cleared by the [place] FDA and  has been authorized for detection and/or diagnosis of SARS-CoV-2 by FDA under an Emergency Use Authorization (EUA). This EUA will remain  in effect (meaning this test can be used) for the duration of the COVID-19 declaration under Section 56 4(b)(1) of the Act, 21 U.S.C. section 360bbb-3(b)(1), unless the authorization is terminated or revoked sooner. Performed at  [facility], [address] [place], [place] [address]      Time coordinating discharge:  Over 30 minutes  SIGNED:   Charise KillianJamiese M Kassidie Hendriks, MD  [facility] 06/03/2019, 3:27 PM Pager   If 7PM-7AM, please contact night-coverage [email or web address] Password [email or web address]

## 2019-06-03 NOTE — TOC Transition Note (Signed)
Transition of Care [facility]) - CM/SW Discharge Note   Patient Details  Name: Zoe [name] MRN: [id number] Date of Birth: 1927-03-27  [facility]) CM/SW Contact:  Barrie Dunker, RN Phone Number: 06/03/2019, 3:21 PM   Clinical Narrative:    Patient to DC to [facility] today via EMS, The bedside nurse to call report to [facility] and call EMS when ready to transport, the patient gave permission for me to call her family and let them know she is going to room 611         Patient Goals and CMS Choice        Discharge Placement                       Discharge Plan and Services                                     Social Determinants of Health (SDOH) Interventions     Readmission Risk Interventions No flowsheet data found.

## 2019-06-03 NOTE — Progress Notes (Signed)
Physical Therapy Treatment Patient Details Name: Zoe [name] MRN: [id number] DOB: Dec 17, 1926 Today's Date: 06/03/2019    History of Present Illness 86 y/o female admitted after fall and sustaining proximal fibular fx and distal tibial fx on R LE now s/p ORIF on 05/29/19. History includes DM and arthritis.    PT Comments    Pt reports that she is tired form being in the recliner and looking forward to going to rehab later today.  She was willing to do some supine bed exercises with b/l LEs but did not feel like doing too much mobility at this point.  Pt participates well with PT and shows good relative strength even in post-sx LE.    Follow Up Recommendations  SNF     Equipment Recommendations  Rolling walker with 5" wheels    Recommendations for Other Services       Precautions / Restrictions Precautions Precautions: Fall Restrictions RLE Weight Bearing: Non weight bearing    Mobility  Bed Mobility               General bed mobility comments: deferred mobility as pt is likely transferring to STR this afternoon and had only recently gotten back to bed  Transfers                    Ambulation/Gait                 Stairs             Wheelchair Mobility    Modified Rankin (Stroke Patients Only)       Balance                                            Cognition Arousal/Alertness: Awake/alert Behavior During Therapy: WFL for tasks assessed/performed Overall Cognitive Status: No family/caregiver present to determine baseline cognitive functioning                                        Exercises General Exercises - Lower Extremity Ankle Circles/Pumps: AROM;10 reps Quad Sets: Strengthening;15 reps Short Arc Quad: AROM;15 reps Hip ABduction/ADduction: Strengthening;15 reps Straight Leg Raises: AROM;15 reps    General Comments        Pertinent Vitals/Pain Pain Assessment: 0-10 Pain Score: 3      Home Living                      Prior Function            PT Goals (current goals can now be found in the care plan section) Progress towards PT goals: Progressing toward goals    Frequency    BID      PT Plan Current plan remains appropriate    Co-evaluation              AM-PAC PT "6 Clicks" Mobility   Outcome Measure  Help needed turning from your back to your side while in a flat bed without using bedrails?: A Little Help needed moving from lying on your back to sitting on the side of a flat bed without using bedrails?: A Little Help needed moving to and from a bed to a chair (including a wheelchair)?: A Little Help needed standing up from a chair using  your arms (e.g., wheelchair or bedside chair)?: A Little Help needed to walk in hospital room?: A Lot Help needed climbing 3-5 steps with a railing? : Total 6 Click Score: 15    End of Session Equipment Utilized During Treatment: Gait belt Activity Tolerance: Patient tolerated treatment well Patient left: in bed;with call bell/phone within reach;with bed alarm set Nurse Communication: Mobility status PT Visit Diagnosis: Muscle weakness (generalized) (M62.81);Difficulty in walking, not elsewhere classified (R26.2);Unsteadiness on feet (R26.81);History of falling (Z91.81) Pain - Right/Left: Right Pain - part of body: Ankle and joints of foot     Time: 8016-5537 PT Time Calculation (min) (ACUTE ONLY): 16 min  Charges:  $Therapeutic Exercise: 8-22 mins                     Malachi Pro, DPT 06/03/2019, 4:19 PM

## 2019-06-03 NOTE — Progress Notes (Signed)
Patient discharged via EMS appr. 9:30pm to [facility].

## 2019-06-03 NOTE — Progress Notes (Signed)
Subjective: 5 Days Post-Op Procedure(s) (LRB): OPEN REDUCTION INTERNAL FIXATION (ORIF) TIBIA/FIBULA FRACTURE (Right) Patient reports pain as 0 on 0-10 scale.   Patient is resting comfortably when I walk into the room this AM. Patient is well, and has had no acute complaints or problems Denies any CP, SOB, ABD pain. We will continue with physical therapy today.  Patient reports that she is passing gas and has had a BM.  Objective: Vital signs in last 24 hours: Temp:  [98 F (36.7 C)-98.8 F (37.1 C)] 98.2 F (36.8 C) (01/14 0527) Pulse Rate:  [80-84] 82 (01/14 0527) Resp:  [14-16] 14 (01/14 0527) BP: (112-136)/(61-79) 131/67 (01/14 0527) SpO2:  [94 %-97 %] 94 % (01/14 0527) Weight:  [85.7 kg] 85.7 kg (01/14 0500)  Intake/Output from previous day: 01/13 0701 - 01/14 0700 In: 360 [P.O.:360] Out: 1100 [Urine:900; Drains:200] Intake/Output this shift: No intake/output data recorded.  Recent Labs    06/01/19 0533 06/02/19 0413 06/03/19 0505  HGB 10.6* 10.6* 10.8*   Recent Labs    06/02/19 0413 06/03/19 0505  WBC 8.0 7.0  RBC 3.31* 3.32*  HCT 32.8* 32.4*  PLT 204 220   Recent Labs    06/02/19 0413 06/03/19 0505  NA 139 140  K 3.9 3.7  CL 104 104  CO2 28 26  BUN 13 10  CREATININE 0.75 0.66  GLUCOSE 116* 120*  CALCIUM 9.2 9.4   No results for input(s): LABPT, INR in the last 72 hours.  EXAM General - Patient is Alert, Appropriate and Oriented Extremity - Neurovascular intact Sensation intact distally Intact pulses distally Compartment soft  Splint intact.  Praveena negative pressure dressing intact with no drainage. Dressing - dressing C/D/I and no drainage Motor Function - intact, moving toes well on exam.  Intact to light touch to the dorsal and volar aspect of the toes.  Cap refill intact.  Past Medical History:  Diagnosis Date  . Arthritis   . Diabetes mellitus without complication (HCC)     Assessment/Plan:   5 Days Post-Op Procedure(s)  (LRB): OPEN REDUCTION INTERNAL FIXATION (ORIF) TIBIA/FIBULA FRACTURE (Right) Principal Problem:   Fall Active Problems:   Artificial cardiac pacemaker   Essential (primary) hypertension   Adult hypothyroidism   Complete heart block, post-surgical (HCC)   Bundle branch block, right   H/O aortic valve replacement   Diabetes mellitus, type 2 (HCC)   Tibia fracture   Left tibial fracture   Macrocytic anemia  Estimated body mass index is 28.74 kg/m as calculated from the following:   Height as of this encounter: 5\' 8"  (1.727 m).   Weight as of this encounter: 85.7 kg. Advance diet Up with therapy, NWB RLE  Pain well controlled.  Continue with therapy today. Labs and vital signs are stable Patient has had a BM. Care management to assist with discharge.  Patient agreeable to SNF  Patient had negative COVID-19 test two days ago, may need another prior to d/c to SNF.  Follow up with [facility] 06/07/2019 for dressing change. Keep splint clean and dry. Lovenox 40 mg SQ daily x 14 days at discharge  DVT Prophylaxis - Lovenox Nonweightbearing right lower extremity  J. 06/09/2019, PA-C [facility] 06/03/2019, 7:31 AM

## 2019-06-03 NOTE — TOC Progression Note (Signed)
Transition of Care [facility]) - Progression Note    Patient Details  Name: Zoe [name] MRN: [id number] Date of Birth: Jan 28, 1927  Transition of Care [facility]) CM/SW [place], RN Phone Number: 06/03/2019, 10:15 AM  Clinical Narrative:    Met with the patient and reviewed bed offers, She has accepted the bed offer at [facility], I notified Tina at [facility] and will start insurance auth       Expected Discharge Plan and Services                                                 Social Determinants of Health (SDOH) Interventions    Readmission Risk Interventions No flowsheet data found.

## 2019-06-03 NOTE — Progress Notes (Signed)
Physical Therapy Treatment Patient Details Name: Zoe [name] MRN: [id number] DOB: Jan 23, 1927 Today's Date: 06/03/2019    History of Present Illness 86 y/o female admitted after fall and sustaining proximal fibular fx and distal tibial fx on R LE now s/p ORIF on 05/29/19. History includes DM and arthritis.    PT Comments    Pt showed great effort and willingness to participate, however she is limited with how well she managed keeping weight off the R LE in standing.  She was able to do some limited ambulation and genuinely did appear to keep most of weight off R but couldn't fully attain/maintain NWBing and was not confident with hopping on L.  Pt showed good strength with LE exercises and with mobility but is very functionally limited with standing/ambulation tasks while trying to keep weight off sx LE.   Follow Up Recommendations  SNF     Equipment Recommendations  Rolling walker with 5" wheels    Recommendations for Other Services       Precautions / Restrictions Precautions Precautions: Fall Restrictions RLE Weight Bearing: Non weight bearing    Mobility  Bed Mobility Overal bed mobility: Needs Assistance Bed Mobility: Supine to Sit     Supine to sit: Supervision     General bed mobility comments: Pt was able to transition to EOB w/o phyiscal assist, some extra cuing for initiating and sequencing  Transfers Overall transfer level: Needs assistance Equipment used: Rolling walker (2 wheeled) Transfers: Sit to/from Stand Sit to Stand: Min assist;From elevated surface         General transfer comment: Pt needed only light assist to attain standing, she appeared to keep most of the weight off R LE but foot remained on the ground and there did at times appear to be some weight going through LE despite nearly constant cuing.    Ambulation/Gait Ambulation/Gait assistance: Min Web designer (Feet): 6 Feet Assistive device: Rolling walker (2 wheeled)        General Gait Details: Pt was able to manage a few very small, labored "hopping" steps on L, but struggled to actually clear R LE with most steps and with cuing to attemp this she was unable to sustain upright with UEs fatigue as well as general quick to fatigue globally   Stairs             Wheelchair Mobility    Modified Rankin (Stroke Patients Only)       Balance Overall balance assessment: Needs assistance Sitting-balance support: Feet supported Sitting balance-Leahy Scale: Good Sitting balance - Comments: steady EOB   Standing balance support: Bilateral upper extremity supported;During functional activity Standing balance-Leahy Scale: Fair Standing balance comment: Even with heavy UE use on walker pt struggled to keep R LE truly NWBing - she made good effort but did not have the strength                            Cognition Arousal/Alertness: Awake/alert Behavior During Therapy: WFL for tasks assessed/performed Overall Cognitive Status: No family/caregiver present to determine baseline cognitive functioning                                        Exercises Total Joint Exercises Ankle Circles/Pumps: AROM;10 reps Quad Sets: AROM;Both;10 reps Towel Squeeze: AROM;Both;10 reps Hip ABduction/ADduction: AROM;Both;10 reps Straight Leg Raises: Both;10 reps;AAROM Long  Arc Quad: AROM;Both;10 reps Marching in Standing: AROM;Both;10 reps;Seated    General Comments        Pertinent Vitals/Pain Pain Assessment: (reports L ankle/leg pain as "not too bad, really")    Home Living                      Prior Function            PT Goals (current goals can now be found in the care plan section) Progress towards PT goals: Progressing toward goals    Frequency    BID      PT Plan Current plan remains appropriate    Co-evaluation              AM-PAC PT "6 Clicks" Mobility   Outcome Measure  Help needed turning from  your back to your side while in a flat bed without using bedrails?: A Little Help needed moving from lying on your back to sitting on the side of a flat bed without using bedrails?: A Little Help needed moving to and from a bed to a chair (including a wheelchair)?: A Little Help needed standing up from a chair using your arms (e.g., wheelchair or bedside chair)?: A Little Help needed to walk in hospital room?: A Lot Help needed climbing 3-5 steps with a railing? : Total 6 Click Score: 15    End of Session Equipment Utilized During Treatment: Gait belt Activity Tolerance: Patient tolerated treatment well Patient left: in bed;with call bell/phone within reach;with bed alarm set Nurse Communication: Mobility status PT Visit Diagnosis: Muscle weakness (generalized) (M62.81);Difficulty in walking, not elsewhere classified (R26.2);Unsteadiness on feet (R26.81);History of falling (Z91.81) Pain - Right/Left: Right Pain - part of body: Ankle and joints of foot     Time: 3419-3790 PT Time Calculation (min) (ACUTE ONLY): 26 min  Charges:  $Gait Training: 8-22 mins $Therapeutic Exercise: 8-22 mins                     Malachi Pro, DPT 06/03/2019, 10:48 AM

## 2019-07-20 ENCOUNTER — Other Ambulatory Visit: Payer: Self-pay | Admitting: Internal Medicine

## 2019-07-20 ENCOUNTER — Other Ambulatory Visit (HOSPITAL_COMMUNITY): Payer: Self-pay | Admitting: Internal Medicine

## 2019-07-20 DIAGNOSIS — M7989 Other specified soft tissue disorders: Secondary | ICD-10-CM

## 2019-07-21 ENCOUNTER — Ambulatory Visit: Payer: Medicare Other

## 2019-07-22 ENCOUNTER — Ambulatory Visit: Admission: RE | Admit: 2019-07-22 | Payer: Medicare Other | Source: Ambulatory Visit

## 2019-08-30 ENCOUNTER — Ambulatory Visit: Payer: Medicare Other

## 2019-09-01 ENCOUNTER — Other Ambulatory Visit: Payer: Self-pay

## 2019-09-01 ENCOUNTER — Ambulatory Visit
Admission: RE | Admit: 2019-09-01 | Discharge: 2019-09-01 | Disposition: A | Discharge status: Home / Self Care | Payer: Medicare Other | Source: Ambulatory Visit | Attending: Internal Medicine | Admitting: Internal Medicine

## 2019-09-01 DIAGNOSIS — M7989 Other specified soft tissue disorders: Secondary | ICD-10-CM | POA: Diagnosis present

## 2019-09-02 ENCOUNTER — Ambulatory Visit: Admission: RE | Admit: 2019-09-02 | Payer: Medicare Other | Source: Ambulatory Visit

## 2020-12-12 IMAGING — XA DG C-ARM 1-60 MIN
10 series · 10 of 10 positions shown · non-contrast
Comparison: 05/28/2019

CLINICAL DATA: Right ankle ORIF

EXAM:
RIGHT ANKLE - 2 VIEW; DG C-ARM 1-60 MIN

[Series 3: cont. · 1 of 1 slices shown (1 of 10)]
[im 1/1]
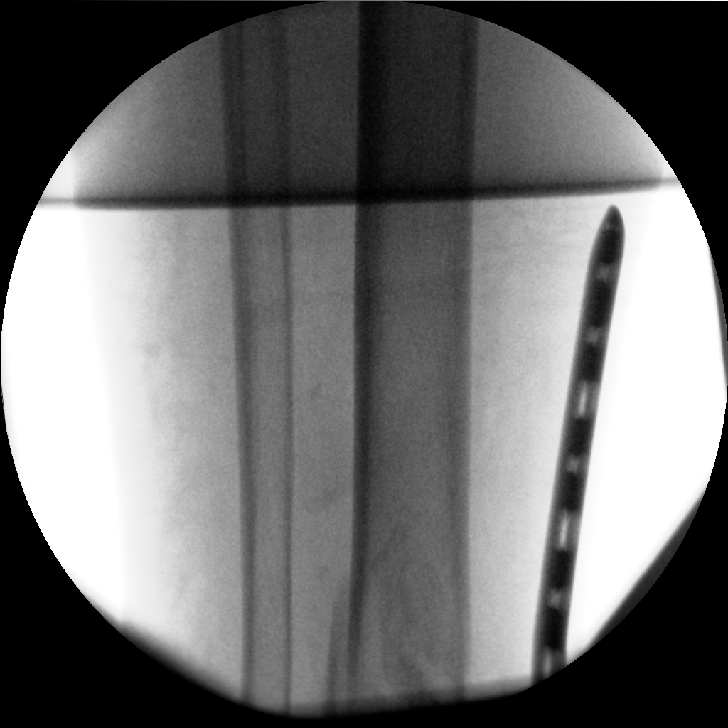

[Series 5: cont. · 1 of 1 slices shown (2 of 10)]
[im 1/1]
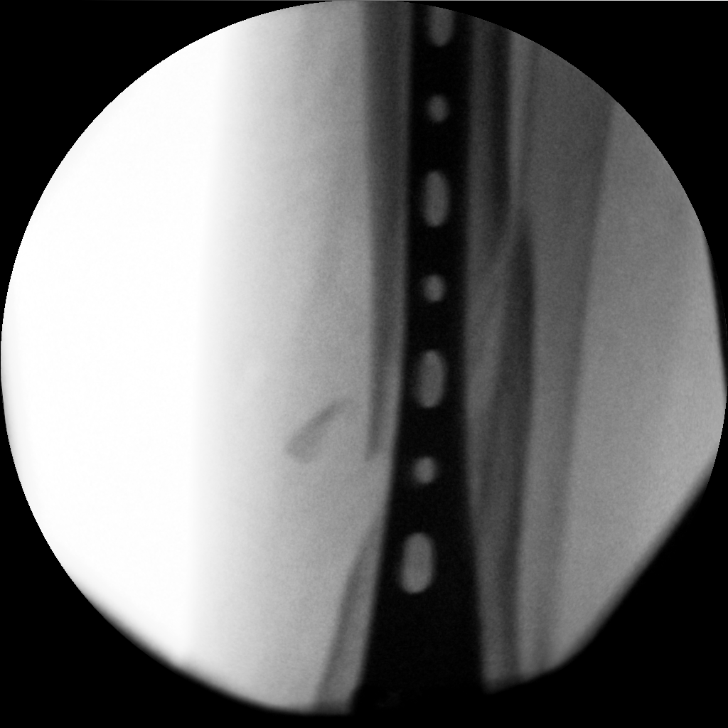

[Series 6: cont. · 1 of 1 slices shown (3 of 10)]
[im 1/1]
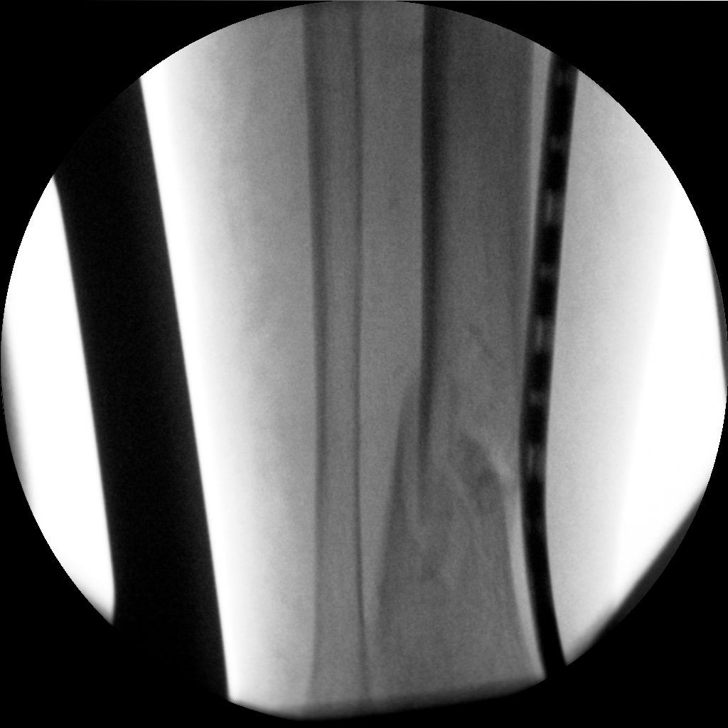

[Series 7: cont. · 1 of 1 slices shown (4 of 10)]
[im 1/1]
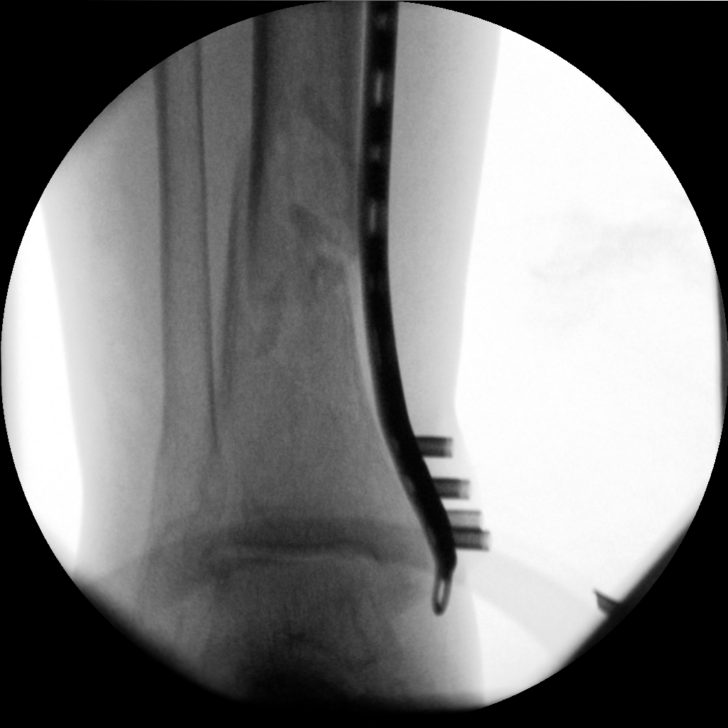

[Series 13: cont. · 1 of 1 slices shown (5 of 10)]
[im 1/1]
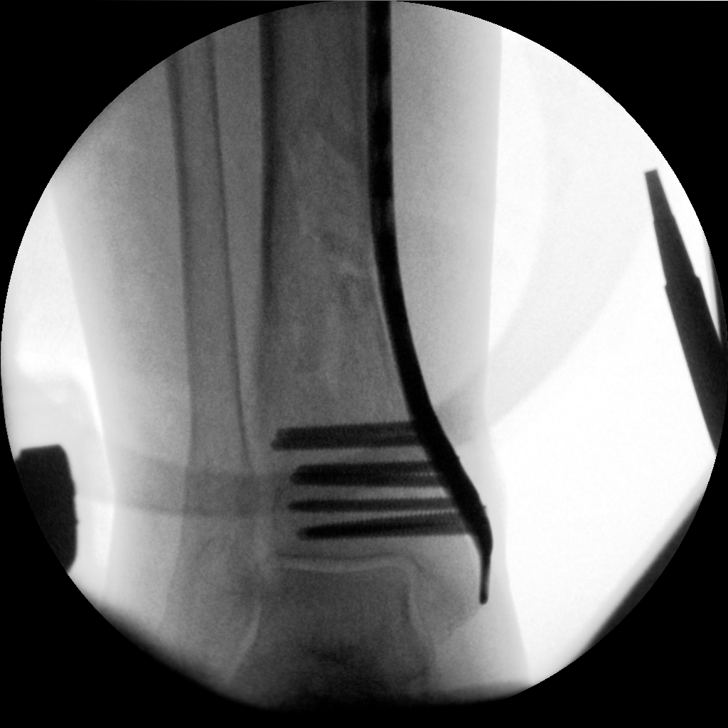

[Series 14: cont. · 1 of 1 slices shown (6 of 10)]
[im 1/1]
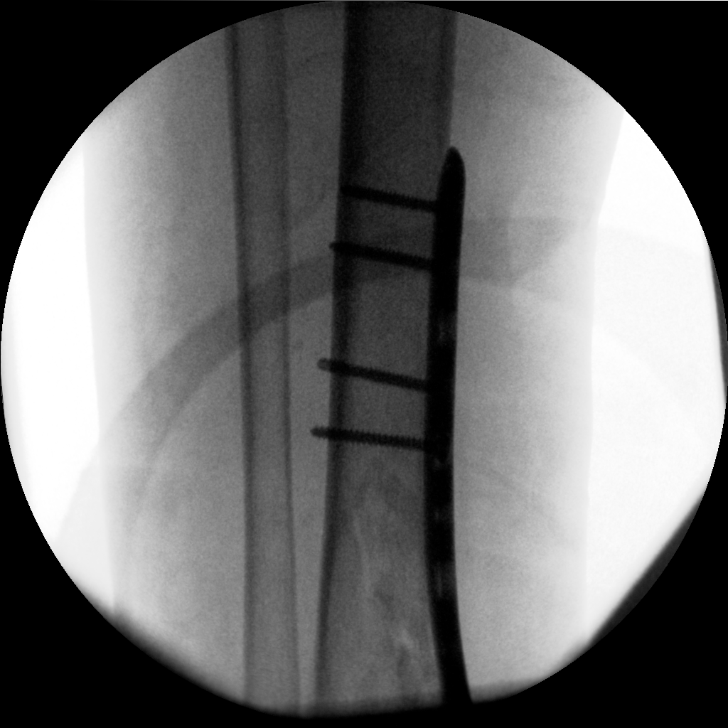

[Series 15: cont. · 1 of 1 slices shown (7 of 10)]
[im 1/1]
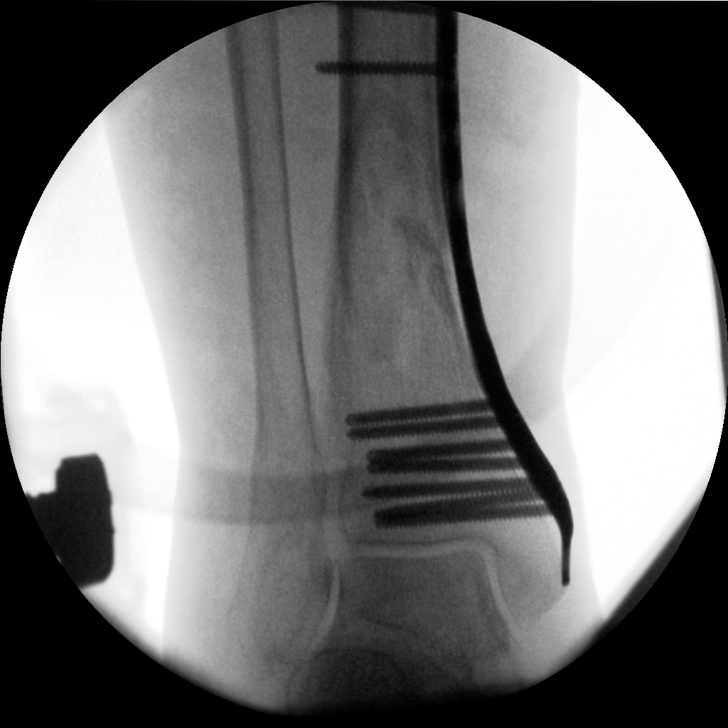

[Series 16: cont. · 1 of 1 slices shown (8 of 10)]
[im 1/1]
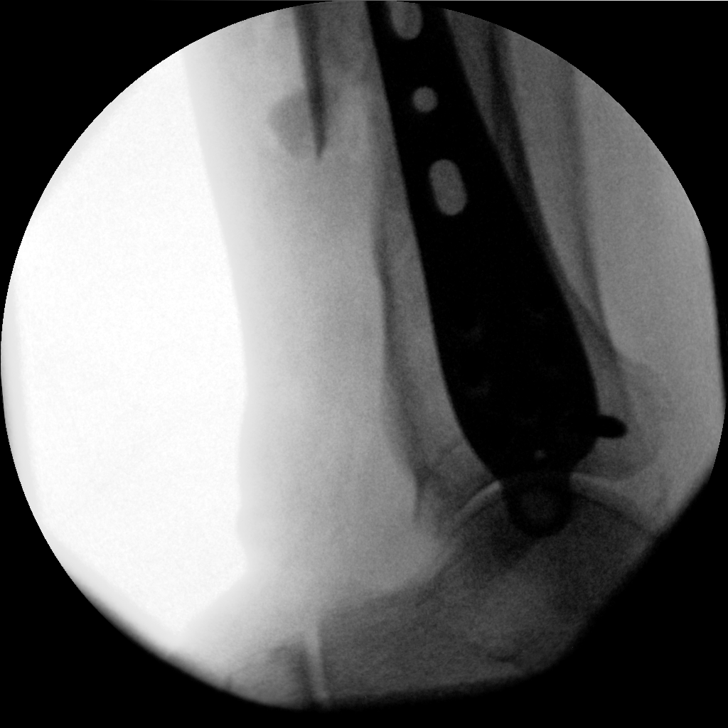

[Series 17: cont. · 1 of 1 slices shown (9 of 10)]
[im 1/1]
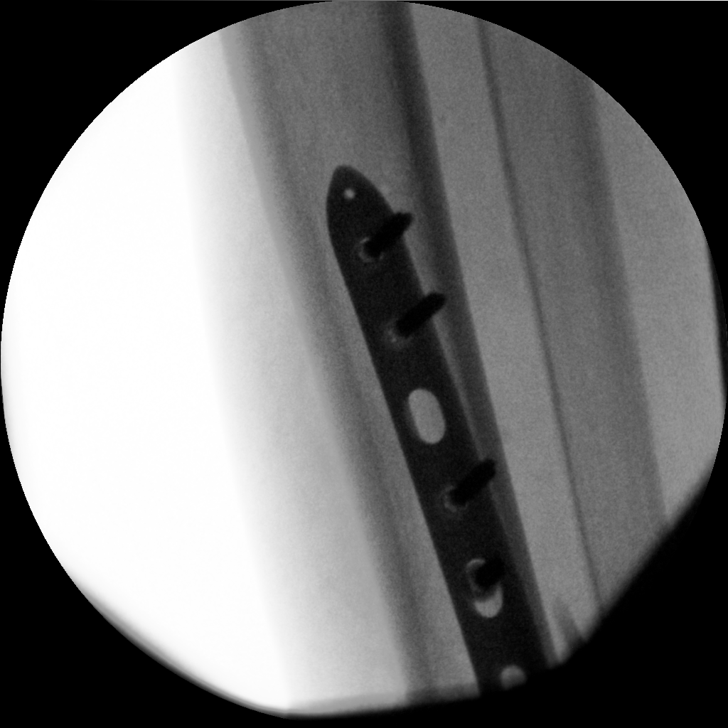

[Series 18: cont. · 1 of 1 slices shown (10 of 10)]
[im 1/1]
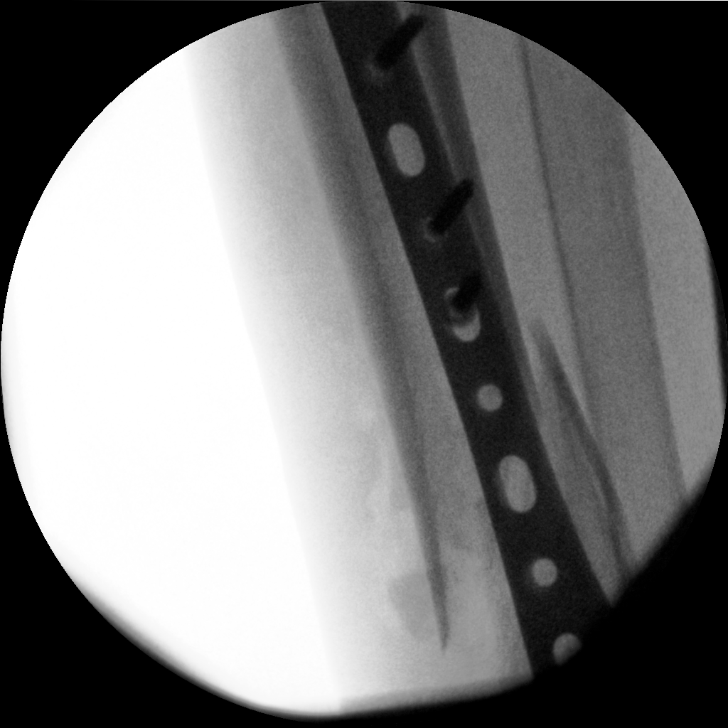

[10 of 10 positions shown; findings below may reference images not displayed]

FINDINGS: Plate and screw fixation noted across the distal tibial fracture. No
hardware complicating feature. Anatomic alignment.
IMPRESSION: Internal fixation.  No complicating feature.

## 2021-05-01 ENCOUNTER — Other Ambulatory Visit: Payer: Self-pay

## 2021-05-01 ENCOUNTER — Emergency Department: Payer: Medicare Other

## 2021-05-01 ENCOUNTER — Encounter: Payer: Self-pay | Admitting: Internal Medicine

## 2021-05-01 ENCOUNTER — Inpatient Hospital Stay
Admission: EM | Admit: 2021-05-01 | Discharge: 2021-05-07 | DRG: 193 | Disposition: A | Discharge status: Skilled Nursing Facility | Payer: Medicare Other | Attending: Internal Medicine | Admitting: Internal Medicine

## 2021-05-01 DIAGNOSIS — E1122 Type 2 diabetes mellitus with diabetic chronic kidney disease: Secondary | ICD-10-CM | POA: Diagnosis present

## 2021-05-01 DIAGNOSIS — I080 Rheumatic disorders of both mitral and aortic valves: Secondary | ICD-10-CM | POA: Diagnosis present

## 2021-05-01 DIAGNOSIS — Z20822 Contact with and (suspected) exposure to covid-19: Secondary | ICD-10-CM | POA: Diagnosis present

## 2021-05-01 DIAGNOSIS — E871 Hypo-osmolality and hyponatremia: Secondary | ICD-10-CM | POA: Diagnosis present

## 2021-05-01 DIAGNOSIS — E039 Hypothyroidism, unspecified: Secondary | ICD-10-CM | POA: Diagnosis present

## 2021-05-01 DIAGNOSIS — Z87891 Personal history of nicotine dependence: Secondary | ICD-10-CM | POA: Diagnosis not present

## 2021-05-01 DIAGNOSIS — E876 Hypokalemia: Secondary | ICD-10-CM | POA: Diagnosis present

## 2021-05-01 DIAGNOSIS — N179 Acute kidney failure, unspecified: Secondary | ICD-10-CM | POA: Diagnosis present

## 2021-05-01 DIAGNOSIS — R4182 Altered mental status, unspecified: Secondary | ICD-10-CM | POA: Diagnosis not present

## 2021-05-01 DIAGNOSIS — J189 Pneumonia, unspecified organism: Secondary | ICD-10-CM | POA: Diagnosis not present

## 2021-05-01 DIAGNOSIS — W19XXXA Unspecified fall, initial encounter: Secondary | ICD-10-CM

## 2021-05-01 DIAGNOSIS — I959 Hypotension, unspecified: Secondary | ICD-10-CM | POA: Diagnosis present

## 2021-05-01 DIAGNOSIS — E119 Type 2 diabetes mellitus without complications: Secondary | ICD-10-CM

## 2021-05-01 DIAGNOSIS — G9341 Metabolic encephalopathy: Secondary | ICD-10-CM | POA: Diagnosis present

## 2021-05-01 DIAGNOSIS — Z79899 Other long term (current) drug therapy: Secondary | ICD-10-CM | POA: Diagnosis not present

## 2021-05-01 DIAGNOSIS — I340 Nonrheumatic mitral (valve) insufficiency: Secondary | ICD-10-CM | POA: Diagnosis present

## 2021-05-01 DIAGNOSIS — Z950 Presence of cardiac pacemaker: Secondary | ICD-10-CM

## 2021-05-01 DIAGNOSIS — Z9114 Patient's other noncompliance with medication regimen: Secondary | ICD-10-CM

## 2021-05-01 DIAGNOSIS — I252 Old myocardial infarction: Secondary | ICD-10-CM | POA: Diagnosis not present

## 2021-05-01 DIAGNOSIS — J156 Pneumonia due to other aerobic Gram-negative bacteria: Secondary | ICD-10-CM | POA: Diagnosis not present

## 2021-05-01 DIAGNOSIS — I2510 Atherosclerotic heart disease of native coronary artery without angina pectoris: Secondary | ICD-10-CM | POA: Diagnosis present

## 2021-05-01 DIAGNOSIS — F0390 Unspecified dementia without behavioral disturbance: Secondary | ICD-10-CM | POA: Diagnosis present

## 2021-05-01 DIAGNOSIS — Z79890 Hormone replacement therapy (postmenopausal): Secondary | ICD-10-CM | POA: Diagnosis not present

## 2021-05-01 DIAGNOSIS — Y92009 Unspecified place in unspecified non-institutional (private) residence as the place of occurrence of the external cause: Secondary | ICD-10-CM

## 2021-05-01 DIAGNOSIS — Z952 Presence of prosthetic heart valve: Secondary | ICD-10-CM

## 2021-05-01 DIAGNOSIS — I442 Atrioventricular block, complete: Secondary | ICD-10-CM | POA: Diagnosis present

## 2021-05-01 DIAGNOSIS — J9601 Acute respiratory failure with hypoxia: Secondary | ICD-10-CM | POA: Diagnosis present

## 2021-05-01 DIAGNOSIS — I10 Essential (primary) hypertension: Secondary | ICD-10-CM | POA: Diagnosis present

## 2021-05-01 DIAGNOSIS — R296 Repeated falls: Secondary | ICD-10-CM | POA: Diagnosis present

## 2021-05-01 DIAGNOSIS — I129 Hypertensive chronic kidney disease with stage 1 through stage 4 chronic kidney disease, or unspecified chronic kidney disease: Secondary | ICD-10-CM | POA: Diagnosis present

## 2021-05-01 LAB — CBC WITH DIFFERENTIAL/PLATELET
Abs Immature Granulocytes: 0.22 10*3/uL — ABNORMAL HIGH (ref 0.00–0.07)
Basophils Absolute: 0.1 10*3/uL (ref 0.0–0.1)
Basophils Relative: 0 %
Eosinophils Absolute: 0.1 10*3/uL (ref 0.0–0.5)
Eosinophils Relative: 1 %
HCT: 33.7 % — ABNORMAL LOW (ref 36.0–46.0)
Hemoglobin: 11.4 g/dL — ABNORMAL LOW (ref 12.0–15.0)
Immature Granulocytes: 1 %
Lymphocytes Relative: 4 %
Lymphs Abs: 0.7 10*3/uL (ref 0.7–4.0)
MCH: 34 pg (ref 26.0–34.0)
MCHC: 33.8 g/dL (ref 30.0–36.0)
MCV: 100.6 fL — ABNORMAL HIGH (ref 80.0–100.0)
Monocytes Absolute: 1.7 10*3/uL — ABNORMAL HIGH (ref 0.1–1.0)
Monocytes Relative: 9 %
Neutro Abs: 16.7 10*3/uL — ABNORMAL HIGH (ref 1.7–7.7)
Neutrophils Relative %: 85 %
Platelets: 195 10*3/uL (ref 150–400)
RBC: 3.35 MIL/uL — ABNORMAL LOW (ref 3.87–5.11)
RDW: 14.2 % (ref 11.5–15.5)
WBC: 19.5 10*3/uL — ABNORMAL HIGH (ref 4.0–10.5)
nRBC: 0 % (ref 0.0–0.2)

## 2021-05-01 LAB — RESP PANEL BY RT-PCR (FLU A&B, COVID) ARPGX2
Influenza A by PCR: NEGATIVE
Influenza B by PCR: NEGATIVE
SARS Coronavirus 2 by RT PCR: NEGATIVE

## 2021-05-01 LAB — COMPREHENSIVE METABOLIC PANEL
ALT: 27 U/L (ref 0–44)
AST: 24 U/L (ref 15–41)
Albumin: 3.9 g/dL (ref 3.5–5.0)
Alkaline Phosphatase: 72 U/L (ref 38–126)
Anion gap: 8 (ref 5–15)
BUN: 27 mg/dL — ABNORMAL HIGH (ref 8–23)
CO2: 23 mmol/L (ref 22–32)
Calcium: 9.1 mg/dL (ref 8.9–10.3)
Chloride: 101 mmol/L (ref 98–111)
Creatinine, Ser: 1.27 mg/dL — ABNORMAL HIGH (ref 0.44–1.00)
GFR, Estimated: 40 mL/min — ABNORMAL LOW (ref >60)
Glucose, Bld: 189 mg/dL — ABNORMAL HIGH (ref 70–99)
Potassium: 3.4 mmol/L — ABNORMAL LOW (ref 3.5–5.1)
Sodium: 132 mmol/L — ABNORMAL LOW (ref 135–145)
Total Bilirubin: 1.4 mg/dL — ABNORMAL HIGH (ref 0.3–1.2)
Total Protein: 7.7 g/dL (ref 6.5–8.1)

## 2021-05-01 LAB — TROPONIN I (HIGH SENSITIVITY): Troponin I (High Sensitivity): 23 ng/L — ABNORMAL HIGH (ref <18)

## 2021-05-01 LAB — TSH: TSH: 69.719 u[IU]/mL — ABNORMAL HIGH (ref 0.350–4.500)

## 2021-05-01 LAB — T4, FREE: Free T4: <0.25 ng/dL — ABNORMAL LOW (ref 0.61–1.12)

## 2021-05-01 MED ORDER — SODIUM CHLORIDE 0.9 % IV SOLN
1.0000 g | Freq: Once | INTRAVENOUS | Status: AC
  Administered 2021-05-01: 1 g via INTRAVENOUS
  Filled 2021-05-01: qty 10

## 2021-05-01 MED ORDER — METOPROLOL SUCCINATE ER 50 MG PO TB24
50.0000 mg | ORAL_TABLET | Freq: Every day | ORAL | Status: DC
  Administered 2021-05-02: 09:00:00 50 mg via ORAL
  Filled 2021-05-01: qty 1

## 2021-05-01 MED ORDER — SODIUM CHLORIDE 0.9 % IV SOLN
500.0000 mg | INTRAVENOUS | Status: DC
  Administered 2021-05-02 – 2021-05-03 (×2): 500 mg via INTRAVENOUS
  Filled 2021-05-01 (×4): qty 5

## 2021-05-01 MED ORDER — SODIUM CHLORIDE 0.9 % IV SOLN
500.0000 mg | Freq: Once | INTRAVENOUS | Status: AC
  Administered 2021-05-01: 500 mg via INTRAVENOUS
  Filled 2021-05-01: qty 5

## 2021-05-01 MED ORDER — LACTATED RINGERS IV SOLN: INTRAVENOUS | Status: DC

## 2021-05-01 MED ORDER — LEVOTHYROXINE SODIUM 88 MCG PO TABS
88.0000 ug | ORAL_TABLET | Freq: Every day | ORAL | Status: DC
  Administered 2021-05-02 – 2021-05-07 (×6): 88 ug via ORAL
  Filled 2021-05-01 (×7): qty 1

## 2021-05-01 MED ORDER — SODIUM CHLORIDE 0.9 % IV SOLN
1.0000 g | INTRAVENOUS | Status: DC
  Administered 2021-05-02 – 2021-05-03 (×2): 1 g via INTRAVENOUS
  Filled 2021-05-01 (×4): qty 10

## 2021-05-01 MED ORDER — LACTATED RINGERS IV BOLUS
1000.0000 mL | Freq: Once | INTRAVENOUS | Status: AC
  Administered 2021-05-01: 1000 mL via INTRAVENOUS

## 2021-05-01 MED ORDER — HEPARIN SODIUM (PORCINE) 5000 UNIT/ML IJ SOLN
5000.0000 [IU] | Freq: Three times a day (TID) | INTRAMUSCULAR | Status: DC
  Administered 2021-05-01 – 2021-05-02 (×2): 5000 [IU] via SUBCUTANEOUS
  Filled 2021-05-01 (×2): qty 1

## 2021-05-01 NOTE — H&P (Signed)
History and Physical    [name] DOB: Oct 16, 1926 DOA: 05/01/2021  PCP: Patient, No Pcp Per (Inactive)    Patient coming from:  Home    Chief Complaint:  AMS/fall   HPI:  Zoe [name] is a 87 y.o. female seen in ed with complaints of AMS after fall 2 days ago.  Son called EMS today at home [facility] brought patient to ED.  Patient is lethargic but awakens to verbal stimuli.  Patient does not remember falling or hitting her head patient denies any complaints today. Per ED report patient had negative PCP in 3 to 4 days prior to coming to the hospital and patient decided to come to the ER after a fall on 7 called EMS. Patient evaluation is arousable, cooperative disoriented.  Pt has past medical history of macrocytic anemia, hypertension, diabetes mellitus type 2, complete heart block hypothyroidism, significant of high degree heart block status post pacemaker, aortic stenosis status post failed TAVR.  ED Course:  Vitals:   05/01/21 2130 05/01/21 2145 05/01/21 2200 05/01/21 2215  BP: 133/80  112/76   Pulse: 78 76 80 78  Resp: (!) 28 (!) 22 (!) 24 (!) 21  Temp:      TempSrc:      SpO2: 97% 95% 94% 95%  In the emergency room patient is afebrile normotensive oxygenating normal on room air.  Patient does meet  sepsis criteria in the emergency room due to white cell count source of infection and white count and respiratory rate blood work shows sodium of 132 potassium of 3.4, glucose of 189 and creatinine of 1.7, bili elevated at 1.4, troponin of 23, CBC shows leukocytosis with a white count of 19.5 hemoglobin 11.4 MCV of 100.6 platelets of 195. Respiratory panel negative for flu and COVID. CT head noncontrast  C-spine negative for any acute intracranial In the emergency room patient received Rocephin and azithromycin for pneumonia, seen on chest x-ray showing consolidation of the left lower compatible with pneumonia.  Review of Systems:  Review of Systems   Unable to perform ROS: Other (pt is dioriented)    Past Medical History:  Diagnosis Date   Arthritis    Diabetes mellitus without complication ([place])     Past Surgical History:  Procedure Laterality Date   CARDIAC SURGERY  2015   OPEN REDUCTION INTERNAL FIXATION (ORIF) TIBIA/FIBULA FRACTURE Right 05/29/2019   Procedure: OPEN REDUCTION INTERNAL FIXATION (ORIF) TIBIA/FIBULA FRACTURE;  Surgeon: Corky Mull, MD;  Location: [facility] ORS;  Service: Orthopedics;  Laterality: Right;     reports that she quit smoking about 52 years ago. Her smoking use included cigarettes. She has a 1.25 pack-year smoking history. She has never used smokeless tobacco. No history on file for alcohol use and drug use.  No Known Allergies  History reviewed. No pertinent family history.  Prior to Admission medications   Medication Sig Start Date End Date Taking? Authorizing Provider  acetaminophen (TYLENOL) 325 MG tablet Take 650 mg by mouth every 6 (six) hours as needed for mild pain or headache.     [provider]  enoxaparin (LOVENOX) 40 MG/0.4ML injection Inject 0.4 mLs (40 mg total) into the skin daily for 14 days. 06/03/19 06/17/19  Lattie Corns, PA-C  famotidine (PEPCID) 20 MG tablet Take 20 mg by mouth 2 (two) times daily. 04/20/19   [provider]  Glucosamine-Chondroitin (OSTEO BI-FLEX REGULAR STRENGTH) 250-200 MG TABS Take 2 tablets by mouth daily.    [provider]  levothyroxine (SYNTHROID, LEVOTHROID) 88 MCG tablet Take 88 mcg by mouth daily before breakfast. TAKE 1 TABLET DAILY ON AN EMPTY STOMACH WITH A GLASS OF WATER AT LEAST 30 TO 60 MINUTES BEFORE BREAKFAST 08/10/14   [provider]  metoprolol succinate (TOPROL-XL) 50 MG 24 hr tablet Take 50 mg by mouth daily. Take with or immediately following a meal.    [provider]  omega-3 acid ethyl esters (LOVAZA) 1 g capsule Take 1 g by mouth daily.    [provider]  oxyCODONE (OXY  IR/ROXICODONE) 5 MG immediate release tablet Take 1-2 tablets (5-10 mg total) by mouth every 4 (four) hours as needed for moderate pain (pain score 4-6). 06/02/19   Anson Oregon, PA-C  pioglitazone (ACTOS) 15 MG tablet Take 15 mg by mouth daily.  03/28/14   [provider]  Vitamin D, Ergocalciferol, (DRISDOL) 1.25 MG (50000 UT) CAPS capsule Take 50,000 Units by mouth every Wednesday.  04/20/19   [provider]    Physical Exam: Vitals:   05/01/21 2130 05/01/21 2145 05/01/21 2200 05/01/21 2215  BP: 133/80  112/76   Pulse: 78 76 80 78  Resp: (!) 28 (!) 22 (!) 24 (!) 21  Temp:      TempSrc:      SpO2: 97% 95% 94% 95%   Physical Exam Vitals reviewed.  Constitutional:      General: She is not in acute distress.    Appearance: She is not ill-appearing.  HENT:     Head: Normocephalic and atraumatic.     Right Ear: External ear normal.     Left Ear: External ear normal.     Nose: Nose normal.     Mouth/Throat:     Mouth: Mucous membranes are dry.  Eyes:     Extraocular Movements: Extraocular movements intact.     Pupils: Pupils are equal, round, and reactive to light.  Neck:     Vascular: No carotid bruit.  Cardiovascular:     Rate and Rhythm: Normal rate and regular rhythm.     Pulses: Normal pulses.     Heart sounds: Normal heart sounds.  Pulmonary:     Effort: Pulmonary effort is normal.     Breath sounds: Normal breath sounds.  Abdominal:     General: Bowel sounds are normal. There is no distension.     Palpations: Abdomen is soft. There is no mass.     Tenderness: There is no abdominal tenderness. There is no guarding.     Hernia: No hernia is present.  Musculoskeletal:     Right lower leg: No edema.     Left lower leg: No edema.  Skin:    General: Skin is warm.  Neurological:     General: No focal deficit present.     Mental Status: She is disoriented.     Comments: Pt disoriented but denies any complaints and ros is negative.   Psychiatric:         Mood and Affect: Mood normal.        Behavior: Behavior normal.     Labs on Admission: I have personally reviewed following labs and imaging studies  No results for input(s): CKTOTAL, CKMB, TROPONINI in the last 72 hours. Lab Results  Component Value Date   WBC 19.5 (H) 05/01/2021   HGB 11.4 (L) 05/01/2021   HCT 33.7 (L) 05/01/2021   MCV 100.6 (H) 05/01/2021   PLT 195 05/01/2021    Recent Labs  Lab 05/01/21 1956  NA 132*  K 3.4*  CL 101  CO2 23  BUN 27*  CREATININE 1.27*  CALCIUM 9.1  PROT 7.7  BILITOT 1.4*  ALKPHOS 72  ALT 27  AST 24  GLUCOSE 189*   Lab Results  Component Value Date   CHOL 204 (H) 05/26/2014   HDL 104 (H) 05/26/2014   LDLCALC 91 05/26/2014   TRIG 45 05/26/2014   No results found for: DDIMER Invalid input(s): POCBNP   COVID-19 Labs No results for input(s): DDIMER, FERRITIN, LDH, CRP in the last 72 hours. Lab Results  Component Value Date   SARSCOV2NAA NEGATIVE 05/01/2021   [place] NEGATIVE 06/01/2019   [place] NEGATIVE 05/28/2019    Radiological Exams on Admission: DG Chest 2 View  Result Date: 05/01/2021 CLINICAL DATA:  Weakness EXAM: CHEST - 2 VIEW COMPARISON:  05/28/2019 FINDINGS: Chronic elevation of the right hemidiaphragm. Right base atelectasis. Consolidation in the left lower lobe concerning for pneumonia. Left pacer is in place, unchanged. Heart is normal size. Prior valve replacement. Aortic atherosclerosis. IMPRESSION: Consolidation in the left lower lung compatible with pneumonia. Stable chronic elevation of the right hemidiaphragm with right base atelectasis. Electronically Signed   By: Rolm Baptise M.D.   On: 05/01/2021 19:37   CT Head Wo Contrast  Result Date: 05/01/2021 CLINICAL DATA:  Head trauma fall EXAM: CT HEAD WITHOUT CONTRAST CT CERVICAL SPINE WITHOUT CONTRAST TECHNIQUE: Multidetector CT imaging of the head and cervical spine was performed following the standard protocol without intravenous contrast.  Multiplanar CT image reconstructions of the cervical spine were also generated. COMPARISON:  CT brain 05/26/2014 FINDINGS: CT HEAD FINDINGS Brain: No acute territorial infarction, hemorrhage, or intracranial mass. Moderate atrophy. Moderate chronic small vessel ischemic changes of the white matter. 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 Nonenlarged ventricles Vascular: No hyperdense vessels.  Carotid vascular calcification Skull: Normal. Negative for fracture or focal lesion. Sinuses/Orbits: No acute finding. Other: None CT CERVICAL SPINE FINDINGS Alignment: No subluxation.  Facet alignment within normal limits. Skull base and vertebrae: No acute fracture. No primary bone lesion or focal pathologic process. Soft tissues and spinal canal: No prevertebral fluid or swelling. No visible canal hematoma. Disc levels: Moderate disc space narrowing and degenerative change C4-C5 and C6-C7. Facet degenerative changes at multiple levels. Upper chest: Negative. Other: None IMPRESSION: 1. No CT evidence for acute intracranial abnormality. Atrophy and chronic small vessel ischemic changes of the white matter 2. Degenerative changes of the cervical spine. No acute osseous abnormality. Electronically Signed   By: Donavan Foil M.D.   On: 05/01/2021 20:04   CT Cervical Spine Wo Contrast  Result Date: 05/01/2021 CLINICAL DATA:  Head trauma fall EXAM: CT HEAD WITHOUT CONTRAST CT CERVICAL SPINE WITHOUT CONTRAST TECHNIQUE: Multidetector CT imaging of the head and cervical spine was performed following the standard protocol without intravenous contrast. Multiplanar CT image reconstructions of the cervical spine were also generated. COMPARISON:  CT brain 05/26/2014 FINDINGS: CT HEAD FINDINGS Brain: No acute territorial infarction, hemorrhage, or intracranial mass. Moderate atrophy. Moderate chronic small vessel ischemic changes of the white matter. 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 Nonenlarged ventricles Vascular:  No hyperdense vessels.  Carotid vascular calcification Skull: Normal. Negative for fracture or focal lesion. Sinuses/Orbits: No acute finding. Other: None CT CERVICAL SPINE FINDINGS Alignment: No subluxation.  Facet alignment within normal limits. Skull base and vertebrae: No acute fracture. No primary bone lesion or focal pathologic process. Soft tissues and spinal canal: No prevertebral fluid or  swelling. No visible canal hematoma. Disc levels: Moderate disc space narrowing and degenerative change C4-C5 and C6-C7. Facet degenerative changes at multiple levels. Upper chest: Negative. Other: None IMPRESSION: 1. No CT evidence for acute intracranial abnormality. Atrophy and chronic small vessel ischemic changes of the white matter 2. Degenerative changes of the cervical spine. No acute osseous abnormality. Electronically Signed   By: Donavan Foil M.D.   On: 05/01/2021 20:04    EKG: Independently reviewed.  Sinus rhythm 88, right bundle branch block, Prolonged QT of 521  Assessment/Plan: Principal Problem:   AMS (altered mental status) Active Problems:   Essential (primary) hypertension   Adult hypothyroidism   MI (mitral incompetence)   Diabetes mellitus, type 2 ([place])   Fall at home, initial encounter   AKI (acute kidney injury) ([place])   Altered mental status: Differentials include electrolyte related encephalopathy, hyper or hypoglycemia, cardiac dysrhythmia, thyroid abnormalities, electrolyte abnormalities. Neuro check.  Comprehensive electrolytes and thyroid function studies.  Hypertension: Blood pressure 112/76, pulse 78, temperature 98.9 F (37.2 C), temperature source Oral, resp. rate (!) 21, SpO2 95 %. Home regimen continue with metoprolol .  Hypothyroidism: We will continue patient's Levothroid 88 mcg per home regimen.  History of MI/heart disease: Again patient continued on metoprolol, statin therapy.  Diabetes mellitus type 2: Sliding scale insulin, glycemic protocol, home  regimen with Actos held.   Fall at home: Aspiration and fall precautions. Physical therapy consult for gait and safety evaluation.  Acute kidney injury: Lab Results  Component Value Date   CREATININE 1.27 (H) 05/01/2021   CREATININE 0.66 06/03/2019   CREATININE 0.75 06/02/2019  Low rate IV fluid hydration over the next 24 to 48 hours.   DVT prophylaxis:  Heparin    Code Status:  Full code    Family Communication:  Elliot Gault (Son)  [phone] (Mobile)   Disposition Plan:  Home    Consults called:  None   Admission status: Inpatient.     Para Skeans MD [facility] [phone] How to contact the [facility] Attending or Consulting provider [place] or covering provider during after hours [place], for this patient.    Check the care team in [facility] and look for a) attending/consulting [facility] provider listed and b) the [facility] team listed Log into [email or web address] and use [place]'s universal password to access. If you do not have the password, please contact the hospital operator. Locate the [facility] provider you are looking for under [facility] and page to a number that you can be directly reached. If you still have difficulty reaching the provider, please page the [facility] (Director on Call) for the Hospitalists listed on amion for assistance. [email or web address] Password [facility] 05/01/2021, 10:28 PM

## 2021-05-01 NOTE — ED Triage Notes (Addendum)
Pt is coming from home via [facility]. Per [facility], pt's son states that pt fell today and has been altered for about 2 days or so. Per son, pt is able to carry conversation. Pt does not take any medications at the moment. Pt does live at home with son.   Pt is Oriented to self and able to follow commands. Pt is able to move both upper and lower extremities.

## 2021-05-01 NOTE — ED Notes (Signed)
Transport to CT. Assessed prior to transport. Drowsy but awakens to verbal. No complaints of pain. See neuro assessment.

## 2021-05-01 NOTE — ED Provider Notes (Signed)
[facility] Emergency Department Provider Note   ____________________________________________   Event Date/Time   First MD Initiated Contact with Patient 05/01/21 1858     (approximate)  I have reviewed the triage vital signs and the nursing notes.   HISTORY  Chief Complaint Altered Mental Status and Fall    HPI Zoe [name] is a 87 y.o. female with past medical history of hypertension, diabetes, hypothyroidism, and complete heart block status post pacemaker who presents to the ED for altered mental status and fall.  History is limited due to patient's confusion and majority of history obtained from EMS.  They state that patient had a trip and fall just prior to arrival while she was with her son, however son was unsure whether patient hit her head or lost consciousness.  Son did state that patient was more confused than usual.  He stated to EMS that she walks and communicates without difficulty at baseline but has been more confused for the past couple of days.  Patient denies any complaints, is not sure why she is here and states she does not remember falling.  She denies any headache, neck pain, extremity pain, chest pain, or abdominal pain.  Son had told EMS that patient had foul-smelling urine recently and he was concerned about a UTI.  Patient denies any dysuria, fevers, cough, shortness of breath, vomiting, or diarrhea.  Son had reported to EMS that patient has not been taking her medications recently.        Past Medical History:  Diagnosis Date   Arthritis    Diabetes mellitus without complication [facility])     Patient Active Problem List   Diagnosis Date Noted   AMS (altered mental status) 05/01/2021    Class: Acute   Macrocytic anemia    Fall at home, initial encounter 05/28/2019   Tibia fracture 05/28/2019   Left tibial fracture 05/28/2019   Essential (primary) hypertension 05/30/2014   Combined fat and carbohydrate induced hyperlipemia  05/30/2014   Diabetes mellitus, type 2 (HCC) 05/30/2014   Artificial cardiac pacemaker 03/11/2014   H/O aortic valve replacement 03/01/2014   Complete heart block, post-surgical (HCC) 02/18/2014   Bundle branch block, right 09/28/2013   AI (aortic incompetence) 09/24/2013   Aortic heart valve narrowing 09/24/2013   Diabetes (HCC) 09/24/2013   Breath shortness 09/24/2013   BP (high blood pressure) 09/24/2013   Adult hypothyroidism 09/24/2013   MI (mitral incompetence) 09/24/2013   Arthritis, degenerative 09/24/2013    Past Surgical History:  Procedure Laterality Date   CARDIAC SURGERY  2015   OPEN REDUCTION INTERNAL FIXATION (ORIF) TIBIA/FIBULA FRACTURE Right 05/29/2019   Procedure: OPEN REDUCTION INTERNAL FIXATION (ORIF) TIBIA/FIBULA FRACTURE;  Surgeon: Christena Flake, MD;  Location: [facility] ORS;  Service: Orthopedics;  Laterality: Right;    Prior to Admission medications   Medication Sig Start Date End Date Taking? Authorizing Provider  acetaminophen (TYLENOL) 325 MG tablet Take 650 mg by mouth every 6 (six) hours as needed for mild pain or headache.     [provider]  enoxaparin (LOVENOX) 40 MG/0.4ML injection Inject 0.4 mLs (40 mg total) into the skin daily for 14 days. 06/03/19 06/17/19  Anson Oregon, PA-C  famotidine (PEPCID) 20 MG tablet Take 20 mg by mouth 2 (two) times daily. 04/20/19   [provider]  Glucosamine-Chondroitin (OSTEO BI-FLEX REGULAR STRENGTH) 250-200 MG TABS Take 2 tablets by mouth daily.    [provider]  levothyroxine (SYNTHROID, LEVOTHROID) 88 MCG tablet  Take 88 mcg by mouth daily before breakfast. TAKE 1 TABLET DAILY ON AN EMPTY STOMACH WITH A GLASS OF WATER AT LEAST 30 TO 60 MINUTES BEFORE BREAKFAST 08/10/14   [provider]  metoprolol succinate (TOPROL-XL) 50 MG 24 hr tablet Take 50 mg by mouth daily. Take with or immediately following a meal.    [provider]  omega-3 acid ethyl esters (LOVAZA) 1 g  capsule Take 1 g by mouth daily.    [provider]  oxyCODONE (OXY IR/ROXICODONE) 5 MG immediate release tablet Take 1-2 tablets (5-10 mg total) by mouth every 4 (four) hours as needed for moderate pain (pain score 4-6). 06/02/19   Anson Oregon, PA-C  pioglitazone (ACTOS) 15 MG tablet Take 15 mg by mouth daily.  03/28/14   [provider]  Vitamin D, Ergocalciferol, (DRISDOL) 1.25 MG (50000 UT) CAPS capsule Take 50,000 Units by mouth every Wednesday.  04/20/19   [provider]    Allergies Patient has no known allergies.  No family history on file.  Social History Social History   Tobacco Use   Smoking status: Former    Packs/day: 0.25    Years: 5.00    Pack years: 1.25    Types: Cigarettes    Quit date: 05/22/1968    Years since quitting: 52.9   Smokeless tobacco: Never    Review of Systems  Constitutional: No fever/chills.  Positive for fall and confusion. Eyes: No visual changes. ENT: No sore throat. Cardiovascular: Denies chest pain. Respiratory: Denies shortness of breath. Gastrointestinal: No abdominal pain.  No nausea, no vomiting.  No diarrhea.  No constipation. Genitourinary: Negative for dysuria. Musculoskeletal: Negative for back pain. Skin: Negative for rash. Neurological: Negative for headaches, focal weakness or numbness.  ____________________________________________   PHYSICAL EXAM:  VITAL SIGNS: ED Triage Vitals  Enc Vitals Group     BP      Pulse      Resp      Temp      Temp src      SpO2      Weight      Height      Head Circumference      Peak Flow      Pain Score      Pain Loc      Pain Edu?      Excl. in GC?     Constitutional: Alert and oriented to person, but not place or time. Eyes: Conjunctivae are normal.  Pupils equal, round, and reactive to light bilaterally. Head: Atraumatic. Nose: No congestion/rhinnorhea. Mouth/Throat: Mucous membranes are moist. Neck: Normal ROM, no midline cervical  spine tenderness to palpation. Cardiovascular: Normal rate, irregularly irregular rhythm. Grossly normal heart sounds.  2+ radial pulses bilaterally. Respiratory: Normal respiratory effort.  No retractions. Lungs CTAB. Gastrointestinal: Soft and nontender. No distention. Genitourinary: deferred Musculoskeletal: No lower extremity tenderness nor edema. Neurologic:  Normal speech and language. No gross focal neurologic deficits are appreciated. Skin:  Skin is warm, dry and intact. No rash noted. Psychiatric: Mood and affect are normal. Speech and behavior are normal.  ____________________________________________   LABS (all labs ordered are listed, but only abnormal results are displayed)  Labs Reviewed  CBC WITH DIFFERENTIAL/PLATELET - Abnormal; Notable for the following components:      Result Value   WBC 19.5 (*)    RBC 3.35 (*)    Hemoglobin 11.4 (*)    HCT 33.7 (*)    MCV 100.6 (*)  Neutro Abs 16.7 (*)    Monocytes Absolute 1.7 (*)    Abs Immature Granulocytes 0.22 (*)    All other components within normal limits  COMPREHENSIVE METABOLIC PANEL - Abnormal; Notable for the following components:   Sodium 132 (*)    Potassium 3.4 (*)    Glucose, Bld 189 (*)    BUN 27 (*)    Creatinine, Ser 1.27 (*)    Total Bilirubin 1.4 (*)    GFR, Estimated 40 (*)    All other components within normal limits  TSH - Abnormal; Notable for the following components:   TSH 69.719 (*)    All other components within normal limits  T4, FREE - Abnormal; Notable for the following components:   Free T4 <0.25 (*)    All other components within normal limits  TROPONIN I (HIGH SENSITIVITY) - Abnormal; Notable for the following components:   Troponin I (High Sensitivity) 23 (*)    All other components within normal limits  RESP PANEL BY RT-PCR (FLU A&B, COVID) [id number]  URINALYSIS, ROUTINE W REFLEX MICROSCOPIC  TROPONIN I (HIGH SENSITIVITY)    ____________________________________________  EKG  ED ECG REPORT I, Chesley Noon, the attending physician, personally viewed and interpreted this ECG.   Date: 05/01/2021  EKG Time: 19:19  Rate: 88  Rhythm: normal sinus rhythm  Axis: LAD  Intervals:right bundle branch block and left anterior fascicular block  ST&T Change: None   PROCEDURES  Procedure(s) performed (including Critical Care):  .Critical Care Performed by: Chesley Noon, MD Authorized by: Chesley Noon, MD   Critical care provider statement:    Critical care time (minutes):  45   Critical care time was exclusive of:  Separately billable procedures and treating other patients and teaching time   Critical care was necessary to treat or prevent imminent or life-threatening deterioration of the following conditions:  Respiratory failure   Critical care was time spent personally by me on the following activities:  Development of treatment plan with patient or surrogate, discussions with consultants, evaluation of patient's response to treatment, examination of patient, ordering and review of laboratory studies, ordering and review of radiographic studies, ordering and performing treatments and interventions, pulse oximetry, re-evaluation of patient's condition and review of old charts   I assumed direction of critical care for this patient from another provider in my specialty: no     Care discussed with: admitting provider     ____________________________________________   INITIAL IMPRESSION / ASSESSMENT AND PLAN / ED COURSE      87 year old female with past medical history of hypertension, diabetes, hypothyroidism, and complete heart block status post pacemaker who presents to the ED for increasing confusion and weakness over the past 2 to 3 days with a fall this evening.  There are no obvious signs of trauma to her head or neck, no evidence of injury to her trunk or extremities.  We will check CT head and  cervical spine given limited history and her advanced age.  She is confused but has no focal neurologic deficits and is not anticoagulated.  Patient noted to be hypoxic to 86% on room air, placed on 2 L nasal cannula with improvement, however she is not in any respiratory distress.  We will check chest x-ray and testing for COVID-19 and influenza.  Check labs for anemia and electrolyte abnormality, also consider hypothyroidism given she has been off her usual medications recently.  CT head and cervical spine are negative for acute traumatic injury.  Labs remarkable  for leukocytosis and chest x-ray reviewed by me, has left lower lobe infiltrate consistent with pneumonia.  This would explain her hypoxic respiratory failure and altered mental status, she continues to maintain O2 sats on 2 L nasal cannula.  We will start patient on Rocephin and azithromycin, vital signs remained stable at this time.  Case discussed with hospitalist for admission.     ____________________________________________   FINAL CLINICAL IMPRESSION(S) / ED DIAGNOSES  Final diagnoses:  Community acquired pneumonia of left lower lobe of lung  Altered mental status, unspecified altered mental status type  Acute respiratory failure with hypoxia [facility])     ED Discharge Orders     None        Note:  This document was prepared using Dragon voice recognition software and may include unintentional dictation errors.    Chesley Noon, MD 05/01/21 2150

## 2021-05-02 DIAGNOSIS — J189 Pneumonia, unspecified organism: Secondary | ICD-10-CM | POA: Diagnosis present

## 2021-05-02 DIAGNOSIS — G9341 Metabolic encephalopathy: Secondary | ICD-10-CM | POA: Diagnosis present

## 2021-05-02 DIAGNOSIS — N179 Acute kidney failure, unspecified: Secondary | ICD-10-CM

## 2021-05-02 DIAGNOSIS — J156 Pneumonia due to other aerobic Gram-negative bacteria: Secondary | ICD-10-CM

## 2021-05-02 LAB — VITAMIN B12: Vitamin B-12: 281 pg/mL (ref 180–914)

## 2021-05-02 LAB — URINALYSIS, COMPLETE (UACMP) WITH MICROSCOPIC
Bilirubin Urine: NEGATIVE
Glucose, UA: 100 mg/dL — AB
Nitrite: POSITIVE — AB
Protein, ur: 100 mg/dL — AB
Specific Gravity, Urine: >1.03 — ABNORMAL HIGH (ref 1.005–1.030)
WBC, UA: >50 WBC/hpf — ABNORMAL HIGH (ref 0–5)
pH: 5.5 (ref 5.0–8.0)

## 2021-05-02 LAB — TYPE AND SCREEN
ABO/RH(D): O POS
Antibody Screen: NEGATIVE

## 2021-05-02 LAB — T4, FREE: Free T4: <0.25 ng/dL — ABNORMAL LOW (ref 0.61–1.12)

## 2021-05-02 LAB — PROTIME-INR
INR: 1.2 (ref 0.8–1.2)
Prothrombin Time: 15 seconds (ref 11.4–15.2)

## 2021-05-02 LAB — CORTISOL-AM, BLOOD: Cortisol - AM: 30.1 ug/dL — ABNORMAL HIGH (ref 6.7–22.6)

## 2021-05-02 LAB — TSH: TSH: 73.624 u[IU]/mL — ABNORMAL HIGH (ref 0.350–4.500)

## 2021-05-02 LAB — PROCALCITONIN: Procalcitonin: 1.07 ng/mL

## 2021-05-02 LAB — TROPONIN I (HIGH SENSITIVITY): Troponin I (High Sensitivity): 29 ng/L — ABNORMAL HIGH (ref <18)

## 2021-05-02 MED ORDER — ENOXAPARIN SODIUM 40 MG/0.4ML IJ SOSY
40.0000 mg | PREFILLED_SYRINGE | INTRAMUSCULAR | Status: DC
  Administered 2021-05-02 – 2021-05-06 (×5): 40 mg via SUBCUTANEOUS
  Filled 2021-05-02 (×5): qty 0.4

## 2021-05-02 NOTE — Progress Notes (Signed)
PROGRESS NOTE    Stephanee Sclafani [facility]  [id number] DOB: 1926/10/31 DOA: 05/01/2021 PCP: Patient, No Pcp Per (Inactive)   Chief complaint.  Altered mental status. Brief Narrative:  AARIANA CALLIS is a 87 y.o. female seen in ed with complaints of AMS after fall 2 days ago. Pt has past medical history of macrocytic anemia, hypertension, diabetes mellitus type 2, complete heart block hypothyroidism, significant of high degree heart block status post pacemaker, aortic stenosis status post failed TAVR. Patient chest x-ray showed left lower lobe consolidation consistent with pneumonia.  Patient was started on Rocephin and Zithromax.  Assessment & Plan:   Principal Problem:   AMS (altered mental status) Active Problems:   Essential (primary) hypertension   Adult hypothyroidism   MI (mitral incompetence)   Diabetes mellitus, type 2 ([place])   Fall at home, initial encounter   AKI (acute kidney injury) ([place])  Acute metabolic encephalopathy.  POA.  Secondary to pneumonia. Left lower lobe and pneumonia. Reviewed the patient chest x-ray image, consistent with a left lower lobe pneumonia. Procalcitonin level also elevated in the 1.07. I will obtain speech therapy evaluation for any possibility of aspiration. Continue antibiotics with Rocephin and Zithromax. I reviewed patient record, patient does not qualify for sepsis.  Hypothyroidism. Patient has significant elevation of TSH, talked with patient and son, patient has been refusing any medications.  Restarted Synthroid at 88 mcg daily.  Coronary artery disease, Aortic stenosis with failed TVAR. Poor prognosis.  Frequent falls. PT is evaluated patient. Type 2 diabetes Continue sliding scale insulin  Likely acute kidney injury. Hyponatremia Hypokalemia. Repleted potassium.  Patient does not have in the lab for the last 2 years.  Not sure he has acute kidney injury versus chronic kidney disease 3b.  We will follow.  Likely chronic  dementia. Patient is quite confused.  We will continue to follow.   DVT prophylaxis: Lovenox Code Status: full Family Communication: Discussed with the son, provided update.  Also discussed about CODE STATUS, patient's son still want patient to have full code. Disposition Plan:    Status is: Inpatient  Remains inpatient appropriate because: Severity of disease and IV antibiotics.        I/O last 3 completed shifts: In: 864.1 [I.V.:614.1; IV Piggyback:250] Out: -  No intake/output data recorded.     Consultants:  None  Procedures: None  Antimicrobials: Rocephin and Zithromax  Subjective: Patient is quite confused.  She is currently on 2 L oxygen, but there is no documented hypoxemia. She does not feel short of breath.  She has a cough, nonproductive. No fever or chills. No dysuria hematuria No abdominal pain or nausea vomiting.  Objective: Vitals:   05/01/21 2345 05/02/21 0535 05/02/21 0733 05/02/21 1113  BP:  98/67 102/63 100/63  Pulse: 79 79 79 80  Resp: (!) 23 18 15 16   Temp:  98 F (36.7 C) 98.5 F (36.9 C) 98.7 F (37.1 C)  TempSrc:  Oral Oral Oral  SpO2: 96% 99% 98% 94%    Intake/Output Summary (Last 24 hours) at 05/02/2021 1212 Last data filed at 05/02/2021 0146 Gross per 24 hour  Intake 864.13 ml  Output --  Net 864.13 ml   There were no vitals filed for this visit.  Examination:  General exam: Appears calm and comfortable  Respiratory system: Clear to auscultation. Respiratory effort normal. Cardiovascular system: S1 & S2 heard, RRR. No JVD, murmurs, rubs, gallops or clicks. No pedal edema. Gastrointestinal system: Abdomen is nondistended, soft and nontender. No  organomegaly or masses felt. Normal bowel sounds heard. Central nervous system: Alert and oriented x1.  No focal neurological deficits. Extremities: Symmetric 5 x 5 power. Skin: No rashes, lesions or ulcers Psychiatry: Mood & affect appropriate.     Data Reviewed: I have  personally reviewed following labs and imaging studies  CBC: Recent Labs  Lab 05/01/21 1956  WBC 19.5*  NEUTROABS 16.7*  HGB 11.4*  HCT 33.7*  MCV 100.6*  PLT 0000000   Basic Metabolic Panel: Recent Labs  Lab 05/01/21 1956  NA 132*  K 3.4*  CL 101  CO2 23  GLUCOSE 189*  BUN 27*  CREATININE 1.27*  CALCIUM 9.1   GFR: CrCl cannot be calculated (Unknown ideal weight.). Liver Function Tests: Recent Labs  Lab 05/01/21 1956  AST 24  ALT 27  ALKPHOS 72  BILITOT 1.4*  PROT 7.7  ALBUMIN 3.9   No results for input(s): LIPASE, AMYLASE in the last 168 hours. No results for input(s): AMMONIA in the last 168 hours. Coagulation Profile: Recent Labs  Lab 05/02/21 0609  INR 1.2   Cardiac Enzymes: No results for input(s): CKTOTAL, CKMB, CKMBINDEX, TROPONINI in the last 168 hours. BNP (last 3 results) No results for input(s): PROBNP in the last 8760 hours. HbA1C: No results for input(s): HGBA1C in the last 72 hours. CBG: No results for input(s): GLUCAP in the last 168 hours. Lipid Profile: No results for input(s): CHOL, HDL, LDLCALC, TRIG, CHOLHDL, LDLDIRECT in the last 72 hours. Thyroid Function Tests: Recent Labs    05/01/21 2358  TSH 73.624*  FREET4 <0.25*   Anemia Panel: Recent Labs    05/01/21 2358  VITAMINB12 281   Sepsis Labs: Recent Labs  Lab 05/02/21 0609  PROCALCITON 1.07    Recent Results (from the past 240 hour(s))  Resp Panel by RT-PCR (Flu A&B, Covid)     Status: None   Collection Time: 05/01/21  7:56 PM   Specimen: Nasopharyngeal(NP) swabs in vial transport medium  Result Value Ref Range Status   SARS Coronavirus 2 by RT PCR NEGATIVE NEGATIVE Final    Comment: (NOTE) SARS-CoV-2 target nucleic acids are NOT DETECTED.  The SARS-CoV-2 RNA is generally detectable in upper respiratory specimens during the acute phase of infection. The lowest concentration of SARS-CoV-2 viral copies this assay can detect is 138 copies/mL. A negative result  does not preclude SARS-Cov-2 infection and should not be used as the sole basis for treatment or other patient management decisions. A negative result may occur with  improper specimen collection/handling, submission of specimen other than nasopharyngeal swab, presence of viral mutation(s) within the areas targeted by this assay, and inadequate number of viral copies(<138 copies/mL). A negative result must be combined with clinical observations, patient history, and epidemiological information. The expected result is Negative.  Fact Sheet for Patients:  EntrepreneurPulse.com.au  Fact Sheet for Healthcare Providers:  IncredibleEmployment.be  This test is no t yet approved or cleared by the [place] FDA and  has been authorized for detection and/or diagnosis of SARS-CoV-2 by FDA under an Emergency Use Authorization (EUA). This EUA will remain  in effect (meaning this test can be used) for the duration of the COVID-19 declaration under Section 564(b)(1) of the Act, 21 U.S.C.section 360bbb-3(b)(1), unless the authorization is terminated  or revoked sooner.       Influenza A by PCR NEGATIVE NEGATIVE Final   Influenza B by PCR NEGATIVE NEGATIVE Final    Comment: (NOTE) The Xpert Xpress SARS-CoV-2/FLU/RSV plus assay is intended  as an aid in the diagnosis of influenza from Nasopharyngeal swab specimens and should not be used as a sole basis for treatment. Nasal washings and aspirates are unacceptable for Xpert Xpress SARS-CoV-2/FLU/RSV testing.  Fact Sheet for Patients: BloggerCourse.com  Fact Sheet for Healthcare Providers: SeriousBroker.it  This test is not yet approved or cleared by the [place] FDA and has been authorized for detection and/or diagnosis of SARS-CoV-2 by FDA under an Emergency Use Authorization (EUA). This EUA will remain in effect (meaning this test can be used) for  the duration of the COVID-19 declaration under Section 564(b)(1) of the Act, 21 U.S.C. section 360bbb-3(b)(1), unless the authorization is terminated or revoked.  Performed at [facility], [address], [place], [place] [address]          Radiology Studies: DG Chest 2 View  Result Date: 05/01/2021 CLINICAL DATA:  Weakness EXAM: CHEST - 2 VIEW COMPARISON:  05/28/2019 FINDINGS: Chronic elevation of the right hemidiaphragm. Right base atelectasis. Consolidation in the left lower lobe concerning for pneumonia. Left pacer is in place, unchanged. Heart is normal size. Prior valve replacement. Aortic atherosclerosis. IMPRESSION: Consolidation in the left lower lung compatible with pneumonia. Stable chronic elevation of the right hemidiaphragm with right base atelectasis. Electronically Signed   By: Charlett Nose M.D.   On: 05/01/2021 19:37   CT Head Wo Contrast  Result Date: 05/01/2021 CLINICAL DATA:  Head trauma fall EXAM: CT HEAD WITHOUT CONTRAST CT CERVICAL SPINE WITHOUT CONTRAST TECHNIQUE: Multidetector CT imaging of the head and cervical spine was performed following the standard protocol without intravenous contrast. Multiplanar CT image reconstructions of the cervical spine were also generated. COMPARISON:  CT brain 05/26/2014 FINDINGS: CT HEAD FINDINGS Brain: No acute territorial infarction, hemorrhage, or intracranial mass. Moderate atrophy. Moderate chronic small vessel ischemic changes of the white matter. 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 Nonenlarged ventricles Vascular: No hyperdense vessels.  Carotid vascular calcification Skull: Normal. Negative for fracture or focal lesion. Sinuses/Orbits: No acute finding. Other: None CT CERVICAL SPINE FINDINGS Alignment: No subluxation.  Facet alignment within normal limits. Skull base and vertebrae: No acute fracture. No primary bone lesion or focal pathologic process. Soft tissues and spinal canal: No prevertebral fluid or  swelling. No visible canal hematoma. Disc levels: Moderate disc space narrowing and degenerative change C4-C5 and C6-C7. Facet degenerative changes at multiple levels. Upper chest: Negative. Other: None IMPRESSION: 1. No CT evidence for acute intracranial abnormality. Atrophy and chronic small vessel ischemic changes of the white matter 2. Degenerative changes of the cervical spine. No acute osseous abnormality. Electronically Signed   By: Jasmine Pang M.D.   On: 05/01/2021 20:04   CT Cervical Spine Wo Contrast  Result Date: 05/01/2021 CLINICAL DATA:  Head trauma fall EXAM: CT HEAD WITHOUT CONTRAST CT CERVICAL SPINE WITHOUT CONTRAST TECHNIQUE: Multidetector CT imaging of the head and cervical spine was performed following the standard protocol without intravenous contrast. Multiplanar CT image reconstructions of the cervical spine were also generated. COMPARISON:  CT brain 05/26/2014 FINDINGS: CT HEAD FINDINGS Brain: No acute territorial infarction, hemorrhage, or intracranial mass. Moderate atrophy. Moderate chronic small vessel ischemic changes of the white matter. 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 Nonenlarged ventricles Vascular: No hyperdense vessels.  Carotid vascular calcification Skull: Normal. Negative for fracture or focal lesion. Sinuses/Orbits: No acute finding. Other: None CT CERVICAL SPINE FINDINGS Alignment: No subluxation.  Facet alignment within normal limits. Skull base and vertebrae: No acute fracture. No primary bone lesion or  focal pathologic process. Soft tissues and spinal canal: No prevertebral fluid or swelling. No visible canal hematoma. Disc levels: Moderate disc space narrowing and degenerative change C4-C5 and C6-C7. Facet degenerative changes at multiple levels. Upper chest: Negative. Other: None IMPRESSION: 1. No CT evidence for acute intracranial abnormality. Atrophy and chronic small vessel ischemic changes of the white matter 2. Degenerative changes of the cervical  spine. No acute osseous abnormality. Electronically Signed   By: Donavan Foil M.D.   On: 05/01/2021 20:04        Scheduled Meds:  heparin  5,000 Units Subcutaneous Q8H   levothyroxine  88 mcg Oral Q0600   metoprolol succinate  50 mg Oral Q breakfast   Continuous Infusions:  azithromycin (ZITHROMAX) 500 MG IVPB (Vial-Mate [employer])     cefTRIAXone (ROCEPHIN)  IV       LOS: 1 day    Time spent: 32 minutes, more than 50% time involved in direct patient care.    Sharen Hones, MD [facility]   To contact the attending provider between 7A-7P or the covering provider during after hours 7P-7A, please log into the web site [email or web address] and access using universal [place] password for that web site. If you do not have the password, please call the hospital operator.  05/02/2021, 12:12 PM

## 2021-05-02 NOTE — Evaluation (Addendum)
Clinical/Bedside Swallow Evaluation Patient Details  Name: Zoe [name] MRN: [id number] Date of Birth: 1926/12/08  Today's Date: 05/02/2021 Time: SLP Start Time (ACUTE ONLY):1455 SLP Stop Time (ACUTE ONLY): 1545 SLP Time Calculation (min) (ACUTE ONLY): 50 min  Past Medical History:  Past Medical History:  Diagnosis Date   Arthritis    Diabetes mellitus without complication (HCC)    Past Surgical History:  Past Surgical History:  Procedure Laterality Date   CARDIAC SURGERY  2015   OPEN REDUCTION INTERNAL FIXATION (ORIF) TIBIA/FIBULA FRACTURE Right 05/29/2019   Procedure: OPEN REDUCTION INTERNAL FIXATION (ORIF) TIBIA/FIBULA FRACTURE;  Surgeon: Christena Flake, MD;  Location: [facility] ORS;  Service: Orthopedics;  Laterality: Right;   HPI:  Pt is a 87 y.o. female with past medical history of hypertension, diabetes, hypothyroidism, and complete heart block status post pacemaker who presents to the ED for altered mental status and fall at home.  Son reported she has been more confused than "usual" in past 2 days.  History is limited due to patient's confusion and majority of history obtained from EMS.  Per chart notes, pt is alert and oriented to self. Patient says she lives with son, but later says she lives alone. She could not remember name of PCP nor pharmacy. Patient says her son cooks and shop when needed.    CXR: Consolidation in the left lower lung compatible with pneumonia.   Head CT: No CT evidence for acute intracranial abnormality. Moderate Atrophy and chronic small vessel ischemic changes of the white matter.  Unsure of pt's Baseline Cognitive status.    Assessment / Plan / Recommendation  Clinical Impression  Pt appears to present w/ Min oral phase dysphagia in light of declined Cognitive status; unsure of pt's Baseline Cogntiive status or if any baseline decline -- see Head CT results. This can impact her overall awareness/timing of swallow and safety during po tasks which increases  risk for aspiration, choking. Pt's risk for aspiration is present but can be reduced when following general aspiration precautions and using a modified diet consistency of Cut/Chopped, broken down foods for ease of oral phase attention/masticaiton/swallowing.        Pt would benefit from Neurology f/u for formal assessment of Cognition if any concerns of decline in status.   She required min-mod verbal/visual/tactile cues for follow through during po tasks. Pt consumed all trials consistencies w/ NO overt clinical s/s of aspiration noted: No decline in vocal quality; no cough, and no decline in respiratory status during/post trials. Oral phase was adequate for bolus management and oral clearing of the liquid and puree boluses given. Mastication of soft solids required min more Time but was overall adequate w/ the moist, broken down trials. Pt self-feeding by [employer] when drinking which increases her safety w/ oral intake, but required min+ support w/ feeding of foods(utensil use as a Cognitive task). OM Exam appeared [facility] w/ No unilateral weakness noted. Some confusion of OM tasks and oral care.              D/t pt's presentation of Min declined Cognitive status. and risk for aspiration/choking, recommend initiation of the dysphagia level 3(Mech soft foods cut/chopped well for ease) w/ Thin liquids via Cup; general aspiration precautions; reduce Distractions during meals and engage pt during po's at meal for self-feeding. Pills Whole vs Crushed in Puree for safer swallowing. Support w/ feeding at meals as needed. MD/NSG updated. ST services recommends follow w/ Palliative Care for GOC and education re: impact  of Cognitive decline on swallowing, overall oral intake. ST services can be available for any further education if needed while admitted. Pt appears at her Baseline from a swallowing/dysphagia standpoint. Precautions posted. SLP Visit Diagnosis: Dysphagia, oral phase (R13.11) (Cognitive decline(?))     Aspiration Risk  Mild aspiration risk;Risk for inadequate nutrition/hydration (reduced following precautions)    Diet Recommendation   dysphagia level 3(Mech soft foods cut/chopped well for ease) w/ Thin liquids via Cup; general aspiration precautions; reduce Distractions during meals and engage pt during po's at meal for self-feeding. Support w/ feeding at meals as needed.   Medication Administration: Whole meds with puree (vs need to Crush in puree)    Other  Recommendations Recommended Consults:  (Dietician f/u; Palliative Care f/u for [facility].)  Neurology consult if indicated.  Oral Care Recommendations: Oral care BID;Oral care before and after PO;Staff/trained caregiver to provide oral care ([employer] care) Other Recommendations:  (n/a)    Recommendations for follow up therapy are one component of a multi-disciplinary discharge planning process, led by the attending physician.  Recommendations may be updated based on patient status, additional functional criteria and insurance authorization.  Follow up Recommendations No SLP follow up      Assistance Recommended at Discharge Intermittent Supervision/Assistance  Functional Status Assessment Patient has had a recent decline in their functional status and/or demonstrates limited ability to make significant improvements in function in a reasonable and predictable amount of time  Frequency and Duration  (n/a)   (n/a)       Prognosis Prognosis for Safe Diet Advancement: Fair (-Good) Barriers to Reach Goals: Cognitive deficits;Time post onset;Severity of deficits;Behavior      Swallow Study   General Date of Onset: 05/01/21 HPI: Pt is a 87 y.o. female with past medical history of hypertension, diabetes, hypothyroidism, and complete heart block status post pacemaker who presents to the ED for altered mental status and fall at home.  Son reported she has been more confused than "usual" in past 2 days.  History is limited due to patient's  confusion and majority of history obtained from EMS.  Per chart notes, pt is alert and oriented to self. Patient says she lives with son, but later says she lives alone. She could not remember name of PCP nor pharmacy. Patient says her son cooks and shop when needed.   CXR: Consolidation in the left lower lung compatible with pneumonia.  Head CT: No CT evidence for acute intracranial abnormality. Moderate Atrophy and chronic small vessel ischemic changes of the white matter.  Unsure of pt's Baseline Cognitive status. Type of Study: Bedside Swallow Evaluation Previous Swallow Assessment: none Diet Prior to this Study: Regular;Thin liquids Temperature Spikes Noted: No (wbc elevated) Respiratory Status: Nasal cannula (2L) History of Recent Intubation: No Behavior/Cognition: Alert;Cooperative;Pleasant mood;Confused;Distractible;Requires cueing (HOH) Oral Cavity Assessment: Dry (dentures) Oral Care Completed by SLP: Yes Oral Cavity - Dentition: Dentures, top;Dentures, bottom Vision: Functional for self-feeding Self-Feeding Abilities: Able to feed self;Needs assist;Needs set up Patient Positioning: Upright in bed (needed positioning support) Baseline Vocal Quality: Low vocal intensity Volitional Cough: Strong Volitional Swallow: Able to elicit (cues)    Oral/Motor/Sensory Function Overall Oral Motor/Sensory Function: Within functional limits   Ice Chips Ice chips: Within functional limits Presentation: Spoon (fed; 3 trials)   Thin Liquid Thin Liquid: Within functional limits Presentation: Cup;Self Fed (~3-4 ozs)    Nectar Thick Nectar Thick Liquid: Not tested   Honey Thick Honey Thick Liquid: Not tested   Puree Puree: Within functional limits Presentation:  Spoon;Self Fed (supported; 8 trials)   Solid     Solid: Impaired (slight - min) Presentation: Spoon (fed; 5 trials) Oral Phase Impairments: Impaired mastication (mi increased oral phase time) Oral Phase Functional Implications:  Prolonged oral transit;Impaired mastication (min) Pharyngeal Phase Impairments:  (none)        Jerilynn Som, MS, [employer] Speech Language Pathologist Rehab Services [phone] Mayvis Agudelo 05/02/2021,5:45 PM

## 2021-05-02 NOTE — Progress Notes (Signed)
No urine output. Unable to collect UA specimen at this time.

## 2021-05-02 NOTE — ED Notes (Signed)
Pt awakens to verbal. Follows commands. Moves all extremities. O2 per [place] at 2 lpm. Skin p/w/d. RR even and non-labored. Sinus Rhythm per cardiac monitor. SBP > 90. No complaints of pain. LR running at 50 ml/hr. Pt has not voided while in the ER. Transport present to bring to inpatient unit.

## 2021-05-02 NOTE — TOC Initial Note (Signed)
Transition of Care [facility]) - Initial/Assessment Note    Patient Details  Name: Zoe [name] MRN: [id number] Date of Birth: [birth year]  [facility] CM/SW Contact:    Hetty Ely, RN Phone Number: 05/02/2021, 3:23 PM  Clinical Narrative: TOCRN spoke with patient who was alert and oriented to self. Patient says she lives with son, later says she lives alone, could not remember name of PCP nor pharmacy. Patient says her son cooks and shop when needed. Attempted to call phone numbers listed on file, Son number not in service, left message on home line to return call.                    Barriers to Discharge: Continued Medical Work up   Patient Goals and CMS Choice        Expected Discharge Plan and Services         Living arrangements for the past 2 months: Single Family Home                                      Prior Living Arrangements/Services Living arrangements for the past 2 months: Single Family Home Lives with:: Adult Children Patient language and need for interpreter reviewed:: Yes        Need for Family Participation in Patient Care: Yes (Comment) Care giver support system in place?: No (comment) (Unable to contact family)   Criminal Activity/Legal Involvement Pertinent to Current Situation/Hospitalization: No - Comment as needed  Activities of Daily Living      Permission Sought/Granted                  Emotional Assessment Appearance:: Appears stated age Attitude/Demeanor/Rapport: Unable to Assess Affect (typically observed): Unable to Assess Orientation: : Oriented to Self Alcohol / Substance Use: Not Applicable Psych Involvement: No (comment)  Admission diagnosis:  Acute respiratory failure with hypoxia (HCC) [J96.01] Altered mental status, unspecified altered mental status type [R41.82] Community acquired pneumonia of left lower lobe of lung [J18.9] AMS (altered mental status) [R41.82] Patient Active Problem List    Diagnosis Date Noted   Acute metabolic encephalopathy 05/02/2021   Left lower lobe pneumonia 05/02/2021   AMS (altered mental status) 05/01/2021    Class: Acute   AKI (acute kidney injury) (HCC) 05/01/2021   Macrocytic anemia    Fall at home, initial encounter 05/28/2019   Tibia fracture 05/28/2019   Left tibial fracture 05/28/2019   Essential (primary) hypertension 05/30/2014   Combined fat and carbohydrate induced hyperlipemia 05/30/2014   Diabetes mellitus, type 2 (HCC) 05/30/2014   Artificial cardiac pacemaker 03/11/2014   H/O aortic valve replacement 03/01/2014   Complete heart block, post-surgical (HCC) 02/18/2014   Bundle branch block, right 09/28/2013   AI (aortic incompetence) 09/24/2013   Aortic heart valve narrowing 09/24/2013   Diabetes (HCC) 09/24/2013   Breath shortness 09/24/2013   BP (high blood pressure) 09/24/2013   Adult hypothyroidism 09/24/2013   MI (mitral incompetence) 09/24/2013   Arthritis, degenerative 09/24/2013   PCP:  Patient, No Pcp Per (Inactive) Pharmacy:   [facility] - [place], [place] - [address] [place] [address] Phone: [phone] Fax: [phone]     Social Determinants of Health (SDOH) Interventions    Readmission Risk Interventions No flowsheet data found.

## 2021-05-02 NOTE — Progress Notes (Signed)
Pt unable to answer questions appropriately at this time. Unable to complete admission profile.

## 2021-05-03 LAB — CBC WITH DIFFERENTIAL/PLATELET
Abs Immature Granulocytes: 0.09 10*3/uL — ABNORMAL HIGH (ref 0.00–0.07)
Basophils Absolute: 0 10*3/uL (ref 0.0–0.1)
Basophils Relative: 0 %
Eosinophils Absolute: 0 10*3/uL (ref 0.0–0.5)
Eosinophils Relative: 0 %
HCT: 32.5 % — ABNORMAL LOW (ref 36.0–46.0)
Hemoglobin: 10.8 g/dL — ABNORMAL LOW (ref 12.0–15.0)
Immature Granulocytes: 1 %
Lymphocytes Relative: 11 %
Lymphs Abs: 1.3 10*3/uL (ref 0.7–4.0)
MCH: 33.1 pg (ref 26.0–34.0)
MCHC: 33.2 g/dL (ref 30.0–36.0)
MCV: 99.7 fL (ref 80.0–100.0)
Monocytes Absolute: 1.1 10*3/uL — ABNORMAL HIGH (ref 0.1–1.0)
Monocytes Relative: 9 %
Neutro Abs: 9.5 10*3/uL — ABNORMAL HIGH (ref 1.7–7.7)
Neutrophils Relative %: 79 %
Platelets: 194 10*3/uL (ref 150–400)
RBC: 3.26 MIL/uL — ABNORMAL LOW (ref 3.87–5.11)
RDW: 14.1 % (ref 11.5–15.5)
WBC: 12 10*3/uL — ABNORMAL HIGH (ref 4.0–10.5)
nRBC: 0 % (ref 0.0–0.2)

## 2021-05-03 LAB — BASIC METABOLIC PANEL
Anion gap: 9 (ref 5–15)
BUN: 21 mg/dL (ref 8–23)
CO2: 23 mmol/L (ref 22–32)
Calcium: 8.9 mg/dL (ref 8.9–10.3)
Chloride: 100 mmol/L (ref 98–111)
Creatinine, Ser: 0.88 mg/dL (ref 0.44–1.00)
GFR, Estimated: >60 mL/min (ref >60)
Glucose, Bld: 179 mg/dL — ABNORMAL HIGH (ref 70–99)
Potassium: 3.5 mmol/L (ref 3.5–5.1)
Sodium: 132 mmol/L — ABNORMAL LOW (ref 135–145)

## 2021-05-03 LAB — MAGNESIUM: Magnesium: 2.1 mg/dL (ref 1.7–2.4)

## 2021-05-03 MED ORDER — METOPROLOL SUCCINATE ER 25 MG PO TB24
12.5000 mg | ORAL_TABLET | Freq: Every day | ORAL | Status: DC
  Administered 2021-05-03: 11:00:00 12.5 mg via ORAL
  Filled 2021-05-03: qty 1

## 2021-05-03 MED ORDER — POTASSIUM CHLORIDE 20 MEQ PO PACK
40.0000 meq | PACK | Freq: Once | ORAL | Status: AC
  Administered 2021-05-03: 40 meq via ORAL
  Filled 2021-05-03: qty 2

## 2021-05-03 NOTE — Progress Notes (Signed)
PROGRESS NOTE    Zoe [name] [facility]  [id number] DOB: 1927-03-07 DOA: 05/01/2021 PCP: Patient, No Pcp Per (Inactive)    Brief Narrative:  Chipper Oman is a 87 y.o. female seen in ed with complaints of AMS after fall 2 days ago. Pt has past medical history of macrocytic anemia, hypertension, diabetes mellitus type 2, complete heart block hypothyroidism, significant of high degree heart block status post pacemaker, aortic stenosis status post failed TAVR. Patient chest x-ray showed left lower lobe consolidation consistent with pneumonia.  Patient was started on Rocephin and Zithromax.   Assessment & Plan:   Principal Problem:   AMS (altered mental status) Active Problems:   Essential (primary) hypertension   Adult hypothyroidism   MI (mitral incompetence)   Diabetes mellitus, type 2 ([place])   Fall at home, initial encounter   AKI (acute kidney injury) ([place])   Acute metabolic encephalopathy   Left lower lobe pneumonia  Acute metabolic encephalopathy.  POA.  Secondary to pneumonia. Left lower lobe and pneumonia. Reviewed the patient chest x-ray image, consistent with a left lower lobe pneumonia. Procalcitonin level also elevated in the 1.07. Patient has been evaluated by speech therapy, no evidence of aspiration. Continue Rocephin and Zithromax.   Hypothyroidism. Continue Synthroid.  Coronary artery disease, Aortic stenosis with failed TVAR. Poor prognosis.  Frequent falls. PT/OT.   Type 2 diabetes Continue sliding scale insulin  Acute kidney injury. Ruled in Hyponatremia Hypokalemia. Potassium 3.5, will give another dose of KCl orally. Patient renal function has normalized, indicating acute kidney injury instead of chronic kidney disease.   Essential hypertension. Patient has a relative hypotension.  We will discontinue metoprolol.  Discussed with Education officer, museum, patient home environment is unsafe.  DVT prophylaxis: Lovenox Code Status: full Family  Communication:  Disposition Plan:    Status is: Inpatient  Remains inpatient appropriate because: Severity of disease, IV antibiotics.        I/O last 3 completed shifts: In: 1214.1 [I.V.:614.1; IV Piggyback:600] Out: 530 [Urine:530] No intake/output data recorded.     Consultants:  None  Procedures: None  Antimicrobials: Rocephin and Zithromax  Subjective: Patient feels better today, no significant short of breath.  Still cough, nonproductive. No fever chills No dysuria hematuria. No abdominal pain or nausea vomiting.  Objective: Vitals:   05/03/21 0508 05/03/21 0745 05/03/21 1134 05/03/21 1139  BP: 91/62 103/83 (!) 96/57 (!) 98/59  Pulse: 80 79 81 80  Resp:  18 15   Temp:  98.7 F (37.1 C) 98.9 F (37.2 C)   TempSrc:  Oral Oral   SpO2:  98% 95%   Weight:      Height:        Intake/Output Summary (Last 24 hours) at 05/03/2021 1154 Last data filed at 05/03/2021 0300 Gross per 24 hour  Intake 350 ml  Output 350 ml  Net 0 ml   Filed Weights   05/02/21 1300  Weight: 86.1 kg    Examination:  General exam: Appears calm and comfortable  Respiratory system: Clear to auscultation. Respiratory effort normal. Cardiovascular system: S1 & S2 heard, RRR. No JVD, murmurs, rubs, gallops or clicks. No pedal edema. Gastrointestinal system: Abdomen is nondistended, soft and nontender. No organomegaly or masses felt. Normal bowel sounds heard. Central nervous system: Alert and oriented x2. No focal neurological deficits. Extremities: Symmetric 5 x 5 power. Skin: No rashes, lesions or ulcers Psychiatry: Judgement and insight appear normal. Mood & affect appropriate.     Data Reviewed: I have personally reviewed  following labs and imaging studies  CBC: Recent Labs  Lab 05/01/21 1956 05/03/21 0544  WBC 19.5* 12.0*  NEUTROABS 16.7* 9.5*  HGB 11.4* 10.8*  HCT 33.7* 32.5*  MCV 100.6* 99.7  PLT 195 [id number]   Basic Metabolic Panel: Recent Labs  Lab  05/01/21 1956 05/03/21 0544  NA 132* 132*  K 3.4* 3.5  CL 101 100  CO2 23 23  GLUCOSE 189* 179*  BUN 27* 21  CREATININE 1.27* 0.88  CALCIUM 9.1 8.9  MG  --  2.1   GFR: Estimated Creatinine Clearance: 46.9 mL/min (by C-G formula based on SCr of 0.88 mg/dL). Liver Function Tests: Recent Labs  Lab 05/01/21 1956  AST 24  ALT 27  ALKPHOS 72  BILITOT 1.4*  PROT 7.7  ALBUMIN 3.9   No results for input(s): LIPASE, AMYLASE in the last 168 hours. No results for input(s): AMMONIA in the last 168 hours. Coagulation Profile: Recent Labs  Lab 05/02/21 0609  INR 1.2   Cardiac Enzymes: No results for input(s): CKTOTAL, CKMB, CKMBINDEX, TROPONINI in the last 168 hours. BNP (last 3 results) No results for input(s): PROBNP in the last 8760 hours. HbA1C: No results for input(s): HGBA1C in the last 72 hours. CBG: No results for input(s): GLUCAP in the last 168 hours. Lipid Profile: No results for input(s): CHOL, HDL, LDLCALC, TRIG, CHOLHDL, LDLDIRECT in the last 72 hours. Thyroid Function Tests: Recent Labs    05/01/21 2358  TSH 73.624*  FREET4 <0.25*   Anemia Panel: Recent Labs    05/01/21 2358  VITAMINB12 281   Sepsis Labs: Recent Labs  Lab 05/02/21 0609  PROCALCITON 1.07    Recent Results (from the past 240 hour(s))  Resp Panel by RT-PCR (Flu A&B, Covid)     Status: None   Collection Time: 05/01/21  7:56 PM   Specimen: Nasopharyngeal(NP) swabs in vial transport medium  Result Value Ref Range Status   SARS Coronavirus 2 by RT PCR NEGATIVE NEGATIVE Final    Comment: (NOTE) SARS-CoV-2 target nucleic acids are NOT DETECTED.  The SARS-CoV-2 RNA is generally detectable in upper respiratory specimens during the acute phase of infection. The lowest concentration of SARS-CoV-2 viral copies this assay can detect is 138 copies/mL. A negative result does not preclude SARS-Cov-2 infection and should not be used as the sole basis for treatment or other patient management  decisions. A negative result may occur with  improper specimen collection/handling, submission of specimen other than nasopharyngeal swab, presence of viral mutation(s) within the areas targeted by this assay, and inadequate number of viral copies(<138 copies/mL). A negative result must be combined with clinical observations, patient history, and epidemiological information. The expected result is Negative.  Fact Sheet for Patients:  EntrepreneurPulse.com.au  Fact Sheet for Healthcare Providers:  IncredibleEmployment.be  This test is no t yet approved or cleared by the [place] FDA and  has been authorized for detection and/or diagnosis of SARS-CoV-2 by FDA under an Emergency Use Authorization (EUA). This EUA will remain  in effect (meaning this test can be used) for the duration of the COVID-19 declaration under Section 564(b)(1) of the Act, 21 U.S.C.section 360bbb-3(b)(1), unless the authorization is terminated  or revoked sooner.       Influenza A by PCR NEGATIVE NEGATIVE Final   Influenza B by PCR NEGATIVE NEGATIVE Final    Comment: (NOTE) The Xpert Xpress SARS-CoV-2/FLU/RSV plus assay is intended as an aid in the diagnosis of influenza from Nasopharyngeal swab specimens and should not be  used as a sole basis for treatment. Nasal washings and aspirates are unacceptable for Xpert Xpress SARS-CoV-2/FLU/RSV testing.  Fact Sheet for Patients: EntrepreneurPulse.com.au  Fact Sheet for Healthcare Providers: IncredibleEmployment.be  This test is not yet approved or cleared by the [place] FDA and has been authorized for detection and/or diagnosis of SARS-CoV-2 by FDA under an Emergency Use Authorization (EUA). This EUA will remain in effect (meaning this test can be used) for the duration of the COVID-19 declaration under Section 564(b)(1) of the Act, 21 U.S.C. section 360bbb-3(b)(1), unless the  authorization is terminated or revoked.  Performed at [facility], [address], [place], [place] [address]          Radiology Studies: DG Chest 2 View  Result Date: 05/01/2021 CLINICAL DATA:  Weakness EXAM: CHEST - 2 VIEW COMPARISON:  05/28/2019 FINDINGS: Chronic elevation of the right hemidiaphragm. Right base atelectasis. Consolidation in the left lower lobe concerning for pneumonia. Left pacer is in place, unchanged. Heart is normal size. Prior valve replacement. Aortic atherosclerosis. IMPRESSION: Consolidation in the left lower lung compatible with pneumonia. Stable chronic elevation of the right hemidiaphragm with right base atelectasis. Electronically Signed   By: Rolm Baptise M.D.   On: 05/01/2021 19:37   CT Head Wo Contrast  Result Date: 05/01/2021 CLINICAL DATA:  Head trauma fall EXAM: CT HEAD WITHOUT CONTRAST CT CERVICAL SPINE WITHOUT CONTRAST TECHNIQUE: Multidetector CT imaging of the head and cervical spine was performed following the standard protocol without intravenous contrast. Multiplanar CT image reconstructions of the cervical spine were also generated. COMPARISON:  CT brain 05/26/2014 FINDINGS: CT HEAD FINDINGS Brain: No acute territorial infarction, hemorrhage, or intracranial mass. Moderate atrophy. Moderate chronic small vessel ischemic changes of the white matter. 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 Nonenlarged ventricles Vascular: No hyperdense vessels.  Carotid vascular calcification Skull: Normal. Negative for fracture or focal lesion. Sinuses/Orbits: No acute finding. Other: None CT CERVICAL SPINE FINDINGS Alignment: No subluxation.  Facet alignment within normal limits. Skull base and vertebrae: No acute fracture. No primary bone lesion or focal pathologic process. Soft tissues and spinal canal: No prevertebral fluid or swelling. No visible canal hematoma. Disc levels: Moderate disc space narrowing and degenerative change C4-C5 and C6-C7.  Facet degenerative changes at multiple levels. Upper chest: Negative. Other: None IMPRESSION: 1. No CT evidence for acute intracranial abnormality. Atrophy and chronic small vessel ischemic changes of the white matter 2. Degenerative changes of the cervical spine. No acute osseous abnormality. Electronically Signed   By: Donavan Foil M.D.   On: 05/01/2021 20:04   CT Cervical Spine Wo Contrast  Result Date: 05/01/2021 CLINICAL DATA:  Head trauma fall EXAM: CT HEAD WITHOUT CONTRAST CT CERVICAL SPINE WITHOUT CONTRAST TECHNIQUE: Multidetector CT imaging of the head and cervical spine was performed following the standard protocol without intravenous contrast. Multiplanar CT image reconstructions of the cervical spine were also generated. COMPARISON:  CT brain 05/26/2014 FINDINGS: CT HEAD FINDINGS Brain: No acute territorial infarction, hemorrhage, or intracranial mass. Moderate atrophy. Moderate chronic small vessel ischemic changes of the white matter. 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 Nonenlarged ventricles Vascular: No hyperdense vessels.  Carotid vascular calcification Skull: Normal. Negative for fracture or focal lesion. Sinuses/Orbits: No acute finding. Other: None CT CERVICAL SPINE FINDINGS Alignment: No subluxation.  Facet alignment within normal limits. Skull base and vertebrae: No acute fracture. No primary bone lesion or focal pathologic process. Soft tissues and spinal canal: No prevertebral fluid or swelling. No visible canal  hematoma. Disc levels: Moderate disc space narrowing and degenerative change C4-C5 and C6-C7. Facet degenerative changes at multiple levels. Upper chest: Negative. Other: None IMPRESSION: 1. No CT evidence for acute intracranial abnormality. Atrophy and chronic small vessel ischemic changes of the white matter 2. Degenerative changes of the cervical spine. No acute osseous abnormality. Electronically Signed   By: Jasmine Pang M.D.   On: 05/01/2021 20:04         Scheduled Meds:  enoxaparin (LOVENOX) injection  40 mg Subcutaneous Q24H   levothyroxine  88 mcg Oral Q0600   Continuous Infusions:  azithromycin (ZITHROMAX) 500 MG IVPB (Vial-Mate [employer]) 500 mg (05/02/21 2329)   cefTRIAXone (ROCEPHIN)  IV 1 g (05/02/21 2248)     LOS: 2 days    Time spent: 27 minutes    Marrion Coy, MD [facility]   To contact the attending provider between 7A-7P or the covering provider during after hours 7P-7A, please log into the web site [email or web address] and access using universal [place] password for that web site. If you do not have the password, please call the hospital operator.  05/03/2021, 11:54 AM

## 2021-05-03 NOTE — Progress Notes (Signed)
Patient pulled out her IV, she is a difficult stick. Will request IV team. Patient also refusing to wear telemetry monitor. Reapplied 3xs, will notify oncoming.

## 2021-05-03 NOTE — TOC Progression Note (Signed)
Transition of Care [facility]) - Progression Note    Patient Details  Name: Zoe [name] MRN: [id number] Date of Birth: Apr 21, 1927  [facility]) CM/SW Contact  Hetty Ely, RN Phone Number: 05/03/2021, 2:08 PM  Clinical Narrative: Unable to contact Son, spoke with Niece Silas Flood who indicates that patient lives with son who is wheelchair bound and patient takes care of him. Annice Pih concerned about patient who is unable to take care of herself, unable to cook, left stove on and [employer]. Had to break down door to put it out. Annice Pih says patient urinates in a bucket that is in her bedroom and continues to be confused speaking of events that occurred in the past. Annice Pih feels patient need evaluation for facility placement or home health daily. Currently gets meals on wheels and she provides three meals a week. Will discuss concerns with Attending.       Barriers to Discharge: Continued Medical Work up  Expected Discharge Plan and Services         Living arrangements for the past 2 months: Single Family Home                                       Social Determinants of Health (SDOH) Interventions    Readmission Risk Interventions No flowsheet data found.

## 2021-05-03 NOTE — Evaluation (Signed)
Physical Therapy Evaluation Patient Details Name: Zoe [name] MRN: [id number] DOB: 08-05-1926 Today's Date: 05/03/2021  History of Present Illness  Zoe [name] is a 87 y.o. female seen in ed with complaints of AMS after fall 2 days ago. Pt has past medical history of macrocytic anemia, hypertension, diabetes mellitus type 2, complete heart block hypothyroidism, significant of high degree heart block status post pacemaker, aortic stenosis status post failed TAVR.  Patient chest x-Verniece Encarnacion showed left lower lobe consolidation consistent with pneumonia.  Clinical Impression  Pt is a pleasant 87 year old female who was admitted for AMS. Pt performs transfers/ambulation with with min assist and RW. Pt demonstrates deficits with strength/mobility/endurance. Pt is currently not at baseline level and appears confused, poor historian. Not safe to care for herself in home at this time and is very high risk for falls. Would benefit from skilled PT to address above deficits and promote optimal return to PLOF; recommend transition to STR upon discharge from acute hospitalization.      Recommendations for follow up therapy are one component of a multi-disciplinary discharge planning process, led by the attending physician.  Recommendations may be updated based on patient status, additional functional criteria and insurance authorization.  Follow Up Recommendations Skilled nursing-short term rehab (<3 hours/day)    Assistance Recommended at Discharge Frequent or constant Supervision/Assistance  Functional Status Assessment Patient has had a recent decline in their functional status and demonstrates the ability to make significant improvements in function in a reasonable and predictable amount of time.  Equipment Recommendations   (TBD)    Recommendations for Other Services       Precautions / Restrictions Precautions Precautions: Fall Restrictions Weight Bearing Restrictions: No       Mobility  Bed Mobility Overal bed mobility: Needs Assistance Bed Mobility: Supine to Sit     Supine to sit: Min guard;HOB elevated     General bed mobility comments: not performed as received in recliner attempting to get up    Transfers Overall transfer level: Needs assistance Equipment used: Rolling walker (2 wheels) Transfers: Sit to/from Stand Sit to Stand: Min assist           General transfer comment: needs cues for hand placement and various levels of assist from different types of surfaces. Needs mod assist from low toilet. Once standing, slightly flexed posture noted    Ambulation/Gait Ambulation/Gait assistance: Min assist Gait Distance (Feet): 20 Feet Assistive device: Rolling walker (2 wheels) Gait Pattern/deviations: Step-through pattern       General Gait Details: ambulated short distance in room with RW. Keeps AD too far away from body with multiple cues for redirection and facilitation of safety technique. 1 LOB noted during turning with min assist for correction.  Stairs            Wheelchair Mobility    Modified Rankin (Stroke Patients Only)       Balance Overall balance assessment: Needs assistance Sitting-balance support: Bilateral upper extremity supported;Feet supported Sitting balance-Leahy Scale: Good     Standing balance support: Bilateral upper extremity supported Standing balance-Leahy Scale: Poor                               Pertinent Vitals/Pain Pain Assessment: No/denies pain Faces Pain Scale: No hurt    Home Living Family/patient expects to be discharged to:: Private residence Living Arrangements: Children (son who is W/C bound per chart)   Type of  Home: House Home Access: Stairs to enter Entrance Stairs-Rails: Can reach both Entrance Stairs-Number of Steps: 4   Home Layout: One level   Additional Comments: pt is very poor historian and all of this section obtained from previous chart review.     Prior Function Prior Level of Function : Needs assist  Cognitive Assist : ADLs (cognitive);Mobility (cognitive)           Mobility Comments: Pt is unreliable historian. She reports being INDEP with fxl mobility. ADLs Comments: unreliable historian. She reports doing all ADLs herself, but her neice reported to case mgt that pt recently left stove on and fire department had to be called.     Hand Dominance        Extremity/Trunk Assessment   Upper Extremity Assessment Upper Extremity Assessment: Generalized weakness    Lower Extremity Assessment Lower Extremity Assessment: Generalized weakness       Communication   Communication: No difficulties  Cognition Arousal/Alertness: Awake/alert Behavior During Therapy: WFL for tasks assessed/performed Overall Cognitive Status: No family/caregiver present to determine baseline cognitive functioning                                 General Comments: pt confused thinking she was at home        General Comments      Exercises Other Exercises Other Exercises: OT facilitates pt participation in fxl mobility to/frmo restroom with RW and commode transfer and LB bathing with MOD A. Other Exercises: Pt ambulated to bathroom for large BM. Pt needs mod assist for transfers on/off toilet and for hygiene.   Assessment/Plan    PT Assessment Patient needs continued PT services  PT Problem List Decreased cognition;Decreased mobility;Decreased balance;Decreased strength       PT Treatment Interventions Gait training;DME instruction;Therapeutic exercise;Balance training    PT Goals (Current goals can be found in the Care Plan section)  Acute Rehab PT Goals Patient Stated Goal: unable to state PT Goal Formulation: Patient unable to participate in goal setting Time For Goal Achievement: 05/17/21 Potential to Achieve Goals: Good    Frequency Min 2X/week   Barriers to discharge        Co-evaluation                AM-PAC PT "6 Clicks" Mobility  Outcome Measure Help needed turning from your back to your side while in a flat bed without using bedrails?: A Little Help needed moving from lying on your back to sitting on the side of a flat bed without using bedrails?: A Little Help needed moving to and from a bed to a chair (including a wheelchair)?: A Little Help needed standing up from a chair using your arms (e.g., wheelchair or bedside chair)?: A Little Help needed to walk in hospital room?: A Lot Help needed climbing 3-5 steps with a railing? : Total 6 Click Score: 15    End of Session Equipment Utilized During Treatment: Gait belt Activity Tolerance: Patient tolerated treatment well Patient left: in chair;with chair alarm set Nurse Communication: Mobility status PT Visit Diagnosis: Unsteadiness on feet (R26.81);Muscle weakness (generalized) (M62.81);Difficulty in walking, not elsewhere classified (R26.2);History of falling (Z91.81)    Time: 9937-1696 PT Time Calculation (min) (ACUTE ONLY): 17 min   Charges:   PT Evaluation $PT Eval Low Complexity: 1 Low PT Treatments $Therapeutic Activity: 8-22 mins        repeat   Izsak Meir 05/03/2021, 4:38 PM

## 2021-05-03 NOTE — Evaluation (Signed)
Occupational Therapy Evaluation Patient Details Name: Zoe [name] MRN: [id number] DOB: 10/28/1926 Today's Date: 05/03/2021   History of Present Illness Zoe [name] is a 87 y.o. female seen in ed with complaints of AMS after fall 2 days ago. Pt has past medical history of macrocytic anemia, hypertension, diabetes mellitus type 2, complete heart block hypothyroidism, significant of high degree heart block status post pacemaker, aortic stenosis status post failed TAVR.  Patient chest x-ray showed left lower lobe consolidation consistent with pneumonia.   Clinical Impression   Pt seen for OT Evaluation this date in setting of acute hospitalization d/t AMS. Pt with poor safety awareness today and is a poor historian. She is able to follow all commands and is pleasantly confused. She reports doing all ADLs/ADL mobility I'ly at baseline, was unable to confirm with her listed son or niece via telephone this date, but did note in chart that fire department recently out to patient's house (per niece, in CM note) d/t pt leaving stove on. The niece reports that pt cares for her son who is w/c bound. Pt presents this date with decreased balance, activity tolerance, strength, and safety awareness. Currently, She requires SETUP for UB ADLs, MOD A for LB ADLs, CGA/MIN A for fxl mobility with RW and cues for safety throughout. Pt left in chair with chair alarm at end of session with all needs met and in reach. Fall mat in place as pt with poor safety awareness and is a fall risk. CNA notified of pt needs. Will continue to follow acutely. Anticipate she will require STR f/u OT services.      Recommendations for follow up therapy are one component of a multi-disciplinary discharge planning process, led by the attending physician.  Recommendations may be updated based on patient status, additional functional criteria and insurance authorization.   Follow Up Recommendations  Skilled nursing-short term rehab  (<3 hours/day)    Assistance Recommended at Discharge Frequent or constant Supervision/Assistance  Functional Status Assessment  Patient has had a recent decline in their functional status and demonstrates the ability to make significant improvements in function in a reasonable and predictable amount of time.  Equipment Recommendations  Other (comment) (defer to next level of care)    Recommendations for Other Services       Precautions / Restrictions Precautions Precautions: Fall Restrictions Weight Bearing Restrictions: No      Mobility Bed Mobility Overal bed mobility: Needs Assistance Bed Mobility: Supine to Sit     Supine to sit: Min guard;HOB elevated          Transfers Overall transfer level: Needs assistance Equipment used: Rolling walker (2 wheels) Transfers: Sit to/from Stand Sit to Stand: Min guard;Min assist           General transfer comment: cues for safety throughout      Balance Overall balance assessment: Needs assistance   Sitting balance-Leahy Scale: Good       Standing balance-Leahy Scale: Fair                             ADL either performed or assessed with clinical judgement   ADL Overall ADL's : Needs assistance/impaired                                       General ADL Comments: SETUP for UB ADLs, MOD  A for LB ADLs, CGA/MIN A for fxl mobility with RW and cues for safety throughout     Vision Patient Visual Report: No change from baseline       Perception     Praxis      Pertinent Vitals/Pain Pain Assessment: Faces Faces Pain Scale: No hurt     Hand Dominance     Extremity/Trunk Assessment Upper Extremity Assessment Upper Extremity Assessment: Generalized weakness   Lower Extremity Assessment Lower Extremity Assessment: Generalized weakness       Communication Communication Communication: No difficulties   Cognition Arousal/Alertness: Awake/alert Behavior During Therapy: WFL  for tasks assessed/performed Overall Cognitive Status: Within Functional Limits for tasks assessed                                       General Comments       Exercises Other Exercises Other Exercises: OT facilitates pt participation in fxl mobility to/frmo restroom with RW and commode transfer and LB bathing with MOD A.   Shoulder Instructions      Home Living Family/patient expects to be discharged to:: Private residence (from chart) Living Arrangements: Children (son who is w/c bound per chart)   Type of Home: House Home Access: Stairs to enter     Home Layout: One level                          Prior Functioning/Environment Prior Level of Function : Needs assist  Cognitive Assist : ADLs (cognitive);Mobility (cognitive)           Mobility Comments: Pt is unreliable historian. She reports being INDEP with fxl mobility. ADLs Comments: unreliable historian. She reports doing all ADLs herself, but her neice reported to case mgt that pt recently left stove on and fire department had to be called.        OT Problem List: Decreased strength;Decreased activity tolerance;Impaired balance (sitting and/or standing);Decreased cognition;Decreased safety awareness;Decreased knowledge of use of DME or AE      OT Treatment/Interventions: Self-care/ADL training;Therapeutic exercise;DME and/or AE instruction;Therapeutic activities;Patient/family education;Balance training    OT Goals(Current goals can be found in the care plan section) Acute Rehab OT Goals Patient Stated Goal: none stated OT Goal Formulation: Patient unable to participate in goal setting Time For Goal Achievement: 05/17/21 Potential to Achieve Goals: Good ADL Goals Pt Will Perform Upper Body Dressing: with set-up;sitting Pt Will Perform Lower Body Dressing: with supervision;sit to/from stand Pt Will Transfer to Toilet: with supervision;ambulating  OT Frequency: Min 2X/week   Barriers  to D/C:            Co-evaluation              AM-PAC OT "6 Clicks" Daily Activity     Outcome Measure Help from another person eating meals?: None Help from another person taking care of personal grooming?: A Little Help from another person toileting, which includes using toliet, bedpan, or urinal?: A Lot Help from another person bathing (including washing, rinsing, drying)?: A Lot Help from another person to put on and taking off regular upper body clothing?: A Little Help from another person to put on and taking off regular lower body clothing?: A Lot 6 Click Score: 16   End of Session Equipment Utilized During Treatment: Gait belt;Rolling walker (2 wheels) Nurse Communication: Mobility status  Activity Tolerance: Patient tolerated treatment well Patient  left: in chair;with call bell/phone within reach;with chair alarm set  OT Visit Diagnosis: Unsteadiness on feet (R26.81);Muscle weakness (generalized) (M62.81);Other symptoms and signs involving cognitive function                Time: 3524-8185 OT Time Calculation (min): 32 min Charges:  OT General Charges $OT Visit: 1 Visit OT Evaluation $OT Eval Moderate Complexity: 1 Mod OT Treatments $Self Care/Home Management : 8-22 mins  Gerrianne Scale, MS, OTR/L ascom [phone] 05/03/21, 4:12 PM

## 2021-05-04 MED ORDER — CEFDINIR 300 MG PO CAPS
300.0000 mg | ORAL_CAPSULE | Freq: Two times a day (BID) | ORAL | Status: AC
  Administered 2021-05-04 – 2021-05-06 (×6): 300 mg via ORAL
  Filled 2021-05-04 (×6): qty 1

## 2021-05-04 MED ORDER — AZITHROMYCIN 500 MG PO TABS
500.0000 mg | ORAL_TABLET | Freq: Every day | ORAL | Status: AC
  Administered 2021-05-04 – 2021-05-05 (×2): 500 mg via ORAL
  Filled 2021-05-04 (×2): qty 1

## 2021-05-04 MED ORDER — ENSURE ENLIVE PO LIQD
237.0000 mL | Freq: Two times a day (BID) | ORAL | Status: DC
  Administered 2021-05-04 – 2021-05-07 (×6): 237 mL via ORAL

## 2021-05-04 MED ORDER — METOPROLOL TARTRATE 25 MG PO TABS: 25.0000 mg | ORAL_TABLET | Freq: Two times a day (BID) | ORAL | Status: DC

## 2021-05-04 NOTE — NC FL2 (Signed)
[place] MEDICAID FL2 LEVEL OF CARE SCREENING TOOL     IDENTIFICATION  Patient Name: Rodolfo Notaro [facility] Birthdate: 1926-05-30 Sex: female Admission Date (Current Location): 05/01/2021  [facility] and [place] Number:  Chiropodist and Address:  [facility], [address] [place], [place] [address]      Provider Number: [id number]  Attending Physician Name and Address:  Marrion Coy, MD  Relative Name and Phone Number:  Roselind Rily, son [phone]    Current Level of Care: Hospital Recommended Level of Care: Skilled Nursing Facility Prior Approval Number:    Date Approved/Denied:   PASRR Number: [id number]  Discharge Plan: SNF    Current Diagnoses: Patient Active Problem List   Diagnosis Date Noted   Acute metabolic encephalopathy 05/02/2021   Left lower lobe pneumonia 05/02/2021   AMS (altered mental status) 05/01/2021   AKI (acute kidney injury) (HCC) 05/01/2021   Macrocytic anemia    Fall at home, initial encounter 05/28/2019   Tibia fracture 05/28/2019   Left tibial fracture 05/28/2019   Essential (primary) hypertension 05/30/2014   Combined fat and carbohydrate induced hyperlipemia 05/30/2014   Diabetes mellitus, type 2 (HCC) 05/30/2014   Artificial cardiac pacemaker 03/11/2014   H/O aortic valve replacement 03/01/2014   Complete heart block, post-surgical (HCC) 02/18/2014   Bundle branch block, right 09/28/2013   AI (aortic incompetence) 09/24/2013   Aortic heart valve narrowing 09/24/2013   Diabetes (HCC) 09/24/2013   Breath shortness 09/24/2013   BP (high blood pressure) 09/24/2013   Adult hypothyroidism 09/24/2013   MI (mitral incompetence) 09/24/2013   Arthritis, degenerative 09/24/2013    Orientation RESPIRATION BLADDER Height & Weight     Self      Weight: 86.1 kg Height:  5\' 8"  (172.7 cm)  BEHAVIORAL SYMPTOMS/MOOD NEUROLOGICAL BOWEL NUTRITION STATUS        Diet  AMBULATORY STATUS COMMUNICATION OF NEEDS  Skin   Limited Assist Verbally Normal                       Personal Care Assistance Level of Assistance  Bathing, Feeding, Dressing Bathing Assistance: Limited assistance Feeding assistance: Limited assistance Dressing Assistance: Limited assistance     Functional Limitations Info  Sight, Hearing, Speech Sight Info: Adequate Hearing Info: Impaired (Hard of hearing) Speech Info: Adequate    SPECIAL CARE FACTORS FREQUENCY  PT (By licensed PT), OT (By licensed OT)     PT Frequency: 5x week OT Frequency: 5x week            Contractures Contractures Info: Not present    Additional Factors Info  Code Status, Allergies Code Status Info: Full Allergies Info: No known allergies           Current Medications (05/04/2021):  This is the current hospital active medication list Current Facility-Administered Medications  Medication Dose Route Frequency Provider Last Rate Last Admin   azithromycin (ZITHROMAX) tablet 500 mg  500 mg Oral Daily 05/06/2021, MD       cefdinir (OMNICEF) capsule 300 mg  300 mg Oral Q12H Marrion Coy, MD       enoxaparin (LOVENOX) injection 40 mg  40 mg Subcutaneous Q24H Marrion Coy, MD   40 mg at 05/03/21 2149   feeding supplement (ENSURE ENLIVE / ENSURE PLUS) liquid 237 mL  237 mL Oral BID BM 2150, MD       levothyroxine (SYNTHROID) tablet 88 mcg  88 mcg Oral Q0600 Marrion Coy, MD  88 mcg at 05/04/21 0605     Discharge Medications: Please see discharge summary for a list of discharge medications.  Relevant Imaging Results:  Relevant Lab Results:   Additional Information ss# [id number]  Hetty Ely, RN

## 2021-05-04 NOTE — TOC Progression Note (Signed)
Transition of Care [facility]) - Progression Note    Patient Details  Name: Zoe [name] MRN: [id number] Date of Birth: 1927-03-05  Transition of Care [facility]) CM/SW Contact  Hetty Ely, RN Phone Number: 05/04/2021, 10:52 AM  Clinical Narrative:  Spoke with son Mellody Dance [phone] about STR per recommendations from PT/OT. Son agrees and prefer [facility], due to location. Son understands that I will send request to local SNF's to identify bed availabilities and will keep him posted.       Barriers to Discharge: Continued Medical Work up  Expected Discharge Plan and Services         Living arrangements for the past 2 months: Single Family Home                                       Social Determinants of Health (SDOH) Interventions    Readmission Risk Interventions No flowsheet data found.

## 2021-05-04 NOTE — Progress Notes (Signed)
PROGRESS NOTE    Zoe [name]  [id number] DOB: Mar 13, 1927 DOA: 05/01/2021 PCP: Patient, No Pcp Per (Inactive)    Brief Narrative:  Zoe [name] is a 87 y.o. female seen in ed with complaints of AMS after fall 2 days ago. Pt has past medical history of macrocytic anemia, hypertension, diabetes mellitus type 2, complete heart block hypothyroidism, significant of high degree heart block status post pacemaker, aortic stenosis status post failed TAVR. Patient chest x-ray showed left lower lobe consolidation consistent with pneumonia.  Patient was started on Rocephin and Zithromax.     Assessment & Plan:   Principal Problem:   AMS (altered mental status) Active Problems:   Essential (primary) hypertension   Adult hypothyroidism   MI (mitral incompetence)   Diabetes mellitus, type 2 (Zoe [name])   Fall at home, initial encounter   AKI (acute kidney injury) ([place])   Acute metabolic encephalopathy   Left lower lobe pneumonia   Acute metabolic encephalopathy.  POA.  Secondary to pneumonia. Left lower lobe and pneumonia. Patient has occasional agitation, but not much confusion. Condition had improved, will change antibiotic to oral. Discussed with RN, not sure how much nutrition she is getting.  Advised nurse to document accurately patient p.o. intake. Add Ensure.  Essential hypertension. Blood pressure is better after discontinuation of beta-blocker.  Continue to follow.  Frequent falls. Patient has been seen by PT and OT, recommended nursing home placement.    Hypothyroidism. Continue Synthroid.  Coronary artery disease, Aortic stenosis with failed TVAR. Poor prognosis.   Type 2 diabetes Continue sliding scale insulin  Acute kidney injury. Ruled in Hyponatremia Hypokalemia. Condition has improved.   DVT prophylaxis: Lovenox Code Status: full Family Communication:  Disposition Plan:      Status is: Inpatient   Remains inpatient appropriate because: Unsafe  discharge.        I/O last 3 completed shifts: In: 600 [IV Piggyback:600] Out: -  Total I/O In: 26 [P.O.:120; IV Piggyback:100] Out: 100 [Urine:100]     Consultants:  None  Procedures: none  Antimicrobials: Cefdinir and Zithromax.  Subjective: Patient was agitated last night, pulled out IV.  She is less confused this morning. She denies any short of breath or cough.  She is currently not on any oxygen. No abdominal pain or nausea vomiting. No dysuria hematuria.  Objective: Vitals:   05/04/21 0700 05/04/21 0818 05/04/21 0900 05/04/21 0930  BP:  100/69    Pulse:  79    Resp: 14 16 16 20   Temp:  98.8 F (37.1 C)    TempSrc:  Oral    SpO2:  93%    Weight:      Height:        Intake/Output Summary (Last 24 hours) at 05/04/2021 1035 Last data filed at 05/04/2021 0933 Gross per 24 hour  Intake 470 ml  Output 100 ml  Net 370 ml   Filed Weights   05/02/21 1300  Weight: 86.1 kg    Examination:  General exam: Appears calm and comfortable  Respiratory system: Clear to auscultation. Respiratory effort normal. Cardiovascular system: S1 & S2 heard, RRR. No JVD, murmurs, rubs, gallops or clicks. No pedal edema. Gastrointestinal system: Abdomen is nondistended, soft and nontender. No organomegaly or masses felt. Normal bowel sounds heard. Central nervous system: Alert and oriented x2. No focal neurological deficits. Extremities: Symmetric 5 x 5 power. Skin: No rashes, lesions or ulcers Psychiatry: Judgement and insight appear normal. Mood & affect appropriate.     Data  Reviewed: I have personally reviewed following labs and imaging studies  CBC: Recent Labs  Lab 05/01/21 1956 05/03/21 0544  WBC 19.5* 12.0*  NEUTROABS 16.7* 9.5*  HGB 11.4* 10.8*  HCT 33.7* 32.5*  MCV 100.6* 99.7  PLT 195 194   Basic Metabolic Panel: Recent Labs  Lab 05/01/21 1956 05/03/21 0544  NA 132* 132*  K 3.4* 3.5  CL 101 100  CO2 23 23  GLUCOSE 189* 179*  BUN 27* 21   CREATININE 1.27* 0.88  CALCIUM 9.1 8.9  MG  --  2.1   GFR: Estimated Creatinine Clearance: 46.9 mL/min (by C-G formula based on SCr of 0.88 mg/dL). Liver Function Tests: Recent Labs  Lab 05/01/21 1956  AST 24  ALT 27  ALKPHOS 72  BILITOT 1.4*  PROT 7.7  ALBUMIN 3.9   No results for input(s): LIPASE, AMYLASE in the last 168 hours. No results for input(s): AMMONIA in the last 168 hours. Coagulation Profile: Recent Labs  Lab 05/02/21 0609  INR 1.2   Cardiac Enzymes: No results for input(s): CKTOTAL, CKMB, CKMBINDEX, TROPONINI in the last 168 hours. BNP (last 3 results) No results for input(s): PROBNP in the last 8760 hours. HbA1C: No results for input(s): HGBA1C in the last 72 hours. CBG: No results for input(s): GLUCAP in the last 168 hours. Lipid Profile: No results for input(s): CHOL, HDL, LDLCALC, TRIG, CHOLHDL, LDLDIRECT in the last 72 hours. Thyroid Function Tests: Recent Labs    05/01/21 2358  TSH 73.624*  FREET4 <0.25*   Anemia Panel: Recent Labs    05/01/21 2358  VITAMINB12 281   Sepsis Labs: Recent Labs  Lab 05/02/21 0609  PROCALCITON 1.07    Recent Results (from the past 240 hour(s))  Resp Panel by RT-PCR (Flu A&B, Covid)     Status: None   Collection Time: 05/01/21  7:56 PM   Specimen: Nasopharyngeal(NP) swabs in vial transport medium  Result Value Ref Range Status   SARS Coronavirus 2 by RT PCR NEGATIVE NEGATIVE Final    Comment: (NOTE) SARS-CoV-2 target nucleic acids are NOT DETECTED.  The SARS-CoV-2 RNA is generally detectable in upper respiratory specimens during the acute phase of infection. The lowest concentration of SARS-CoV-2 viral copies this assay can detect is 138 copies/mL. A negative result does not preclude SARS-Cov-2 infection and should not be used as the sole basis for treatment or other patient management decisions. A negative result may occur with  improper specimen collection/handling, submission of specimen  other than nasopharyngeal swab, presence of viral mutation(s) within the areas targeted by this assay, and inadequate number of viral copies(<138 copies/mL). A negative result must be combined with clinical observations, patient history, and epidemiological information. The expected result is Negative.  Fact Sheet for Patients:  BloggerCourse.com  Fact Sheet for Healthcare Providers:  SeriousBroker.it  This test is no t yet approved or cleared by the [place] FDA and  has been authorized for detection and/or diagnosis of SARS-CoV-2 by FDA under an Emergency Use Authorization (EUA). This EUA will remain  in effect (meaning this test can be used) for the duration of the COVID-19 declaration under Section 564(b)(1) of the Act, 21 U.S.C.section 360bbb-3(b)(1), unless the authorization is terminated  or revoked sooner.       Influenza A by PCR NEGATIVE NEGATIVE Final   Influenza B by PCR NEGATIVE NEGATIVE Final    Comment: (NOTE) The Xpert Xpress SARS-CoV-2/FLU/RSV plus assay is intended as an aid in the diagnosis of influenza from Nasopharyngeal swab  specimens and should not be used as a sole basis for treatment. Nasal washings and aspirates are unacceptable for Xpert Xpress SARS-CoV-2/FLU/RSV testing.  Fact Sheet for Patients: EntrepreneurPulse.com.au  Fact Sheet for Healthcare Providers: IncredibleEmployment.be  This test is not yet approved or cleared by the [place] FDA and has been authorized for detection and/or diagnosis of SARS-CoV-2 by FDA under an Emergency Use Authorization (EUA). This EUA will remain in effect (meaning this test can be used) for the duration of the COVID-19 declaration under Section 564(b)(1) of the Act, 21 U.S.C. section 360bbb-3(b)(1), unless the authorization is terminated or revoked.  Performed at [facility], [address]  [place], [place] [address]          Radiology Studies: No results found.      Scheduled Meds:  azithromycin  500 mg Oral Daily   cefdinir  300 mg Oral Q12H   enoxaparin (LOVENOX) injection  40 mg Subcutaneous Q24H   levothyroxine  88 mcg Oral Q0600   metoprolol tartrate  25 mg Oral BID   Continuous Infusions:   LOS: 3 days    Time spent: 28 minutes    Sharen Hones, MD [facility]   To contact the attending provider between 7A-7P or the covering provider during after hours 7P-7A, please log into the web site [email or web address] and access using universal [place] password for that web site. If you do not have the password, please call the [name] operator.  05/04/2021, 10:35 AM

## 2021-05-05 MED ORDER — SENNOSIDES-DOCUSATE SODIUM 8.6-50 MG PO TABS
2.0000 | ORAL_TABLET | Freq: Two times a day (BID) | ORAL | Status: DC
  Administered 2021-05-05 – 2021-05-07 (×5): 2 via ORAL
  Filled 2021-05-05 (×5): qty 2

## 2021-05-05 NOTE — TOC Progression Note (Signed)
Transition of Care [facility]) - Progression Note    Patient Details  Name: NAELLE DIEGEL MRN: [id number] Date of Birth: [birth year]  Transition of Care [facility]) CM/SW Contact  Marlowe Sax, RN Phone Number: 05/05/2021, 12:18 PM  Clinical Narrative:   reached out to the son Maisie Fus, [facility], I notified [facility] at [facility], Will need Insurance approval      Barriers to Discharge: Continued Medical Work up  Expected Discharge Plan and Services         Living arrangements for the past 2 months: Single Family Home                                       Social Determinants of Health (SDOH) Interventions    Readmission Risk Interventions No flowsheet data found.

## 2021-05-05 NOTE — Progress Notes (Signed)
PROGRESS NOTE    Chinmayi Mullis [facility]  [id number] DOB: [birth year] DOA: 05/01/2021 PCP: Patient, No Pcp Per (Inactive)    Brief Narrative:  Zoe [name] is a 87 y.o. female seen in ed with complaints of AMS after fall 2 days ago. Pt has past medical history of macrocytic anemia, hypertension, diabetes mellitus type 2, complete heart block hypothyroidism, significant of high degree heart block status post pacemaker, aortic stenosis status post failed TAVR. Patient chest x-ray showed left lower lobe consolidation consistent with pneumonia.  Patient was started on Rocephin and Zithromax.   Assessment & Plan:   Principal Problem:   AMS (altered mental status) Active Problems:   Essential (primary) hypertension   Adult hypothyroidism   MI (mitral incompetence)   Diabetes mellitus, type 2 ([place])   Fall at home, initial encounter   AKI (acute kidney injury) ([place])   Acute metabolic encephalopathy   Left lower lobe pneumonia  Acute metabolic encephalopathy.  POA.  Secondary to pneumonia. Left lower lobe and pneumonia. Patient condition much improved.  No longer has any confusion. Antibiotic switched to oral cefdinir and Zithromax. Appetite is also improving. Currently pending nursing home placement.   Essential hypertension. Relative hypotension. Beta-blocker was discontinued, blood pressure is not significant elevated.   Frequent falls. Pending nursing home placement.     Hypothyroidism. Patient was not taking Synthroid at home, TSH was significant elevated.  Synthroid restarted.  Coronary artery disease, Aortic stenosis with failed TVAR. Poor prognosis.  No evidence of congestive heart failure at this time.   Type 2 diabetes Continue sliding scale insulin  Acute kidney injury. Ruled in Hyponatremia Hypokalemia. Condition has improved.    DVT prophylaxis: Lovenox Code Status: full Family Communication:  Disposition Plan:    Status is: Inpatient  Remains  inpatient appropriate because: Unsafe discharge.        I/O last 3 completed shifts: In: 9 [P.O.:120; IV Piggyback:350] Out: 450 [Urine:450] No intake/output data recorded.     Consultants:  None  Procedures: None  Antimicrobials: Cefdinir, Zithromax  Subjective: Patient seems to be back to baseline, she no longer has any confusion. She states that she is eating well, but she feels constipated.  No abdominal pain or nausea vomiting.  Senna started. Denies any short of breath or cough. She is off oxygen. No fever chills  No dysuria hematuria.  Objective: Vitals:   05/04/21 1922 05/04/21 1955 05/05/21 0547 05/05/21 0808  BP: 129/72 109/67 102/70 103/62  Pulse: 80 77 75 81  Resp: 16 18 18 17   Temp: 99.3 F (37.4 C)  98.6 F (37 C) 98.5 F (36.9 C)  TempSrc: Oral  Oral Oral  SpO2: 95% 96% 92% 94%  Weight:      Height:        Intake/Output Summary (Last 24 hours) at 05/05/2021 0940 Last data filed at 05/05/2021 0547 Gross per 24 hour  Intake 0 ml  Output 350 ml  Net -350 ml   Filed Weights   05/02/21 1300  Weight: 86.1 kg    Examination:  General exam: Appears calm and comfortable  Respiratory system: Clear to auscultation. Respiratory effort normal. Cardiovascular system: S1 & S2 heard, RRR. No JVD, murmurs, rubs, gallops or clicks. No pedal edema. Gastrointestinal system: Abdomen is nondistended, soft and nontender. No organomegaly or masses felt. Normal bowel sounds heard. Central nervous system: Alert and oriented x3. No focal neurological deficits. Extremities: Symmetric 5 x 5 power. Skin: No rashes, lesions or ulcers Psychiatry: Mood & affect  appropriate.     Data Reviewed: I have personally reviewed following labs and imaging studies  CBC: Recent Labs  Lab 05/01/21 1956 05/03/21 0544  WBC 19.5* 12.0*  NEUTROABS 16.7* 9.5*  HGB 11.4* 10.8*  HCT 33.7* 32.5*  MCV 100.6* 99.7  PLT 195 [id number]   Basic Metabolic Panel: Recent Labs  Lab  05/01/21 1956 05/03/21 0544  NA 132* 132*  K 3.4* 3.5  CL 101 100  CO2 23 23  GLUCOSE 189* 179*  BUN 27* 21  CREATININE 1.27* 0.88  CALCIUM 9.1 8.9  MG  --  2.1   GFR: Estimated Creatinine Clearance: 46.9 mL/min (by C-G formula based on SCr of 0.88 mg/dL). Liver Function Tests: Recent Labs  Lab 05/01/21 1956  AST 24  ALT 27  ALKPHOS 72  BILITOT 1.4*  PROT 7.7  ALBUMIN 3.9   No results for input(s): LIPASE, AMYLASE in the last 168 hours. No results for input(s): AMMONIA in the last 168 hours. Coagulation Profile: Recent Labs  Lab 05/02/21 0609  INR 1.2   Cardiac Enzymes: No results for input(s): CKTOTAL, CKMB, CKMBINDEX, TROPONINI in the last 168 hours. BNP (last 3 results) No results for input(s): PROBNP in the last 8760 hours. HbA1C: No results for input(s): HGBA1C in the last 72 hours. CBG: No results for input(s): GLUCAP in the last 168 hours. Lipid Profile: No results for input(s): CHOL, HDL, LDLCALC, TRIG, CHOLHDL, LDLDIRECT in the last 72 hours. Thyroid Function Tests: No results for input(s): TSH, T4TOTAL, FREET4, T3FREE, THYROIDAB in the last 72 hours. Anemia Panel: No results for input(s): VITAMINB12, FOLATE, FERRITIN, TIBC, IRON, RETICCTPCT in the last 72 hours. Sepsis Labs: Recent Labs  Lab 05/02/21 0609  PROCALCITON 1.07    Recent Results (from the past 240 hour(s))  Resp Panel by RT-PCR (Flu A&B, Covid)     Status: None   Collection Time: 05/01/21  7:56 PM   Specimen: Nasopharyngeal(NP) swabs in vial transport medium  Result Value Ref Range Status   SARS Coronavirus 2 by RT PCR NEGATIVE NEGATIVE Final    Comment: (NOTE) SARS-CoV-2 target nucleic acids are NOT DETECTED.  The SARS-CoV-2 RNA is generally detectable in upper respiratory specimens during the acute phase of infection. The lowest concentration of SARS-CoV-2 viral copies this assay can detect is 138 copies/mL. A negative result does not preclude SARS-Cov-2 infection and should  not be used as the sole basis for treatment or other patient management decisions. A negative result may occur with  improper specimen collection/handling, submission of specimen other than nasopharyngeal swab, presence of viral mutation(s) within the areas targeted by this assay, and inadequate number of viral copies(<138 copies/mL). A negative result must be combined with clinical observations, patient history, and epidemiological information. The expected result is Negative.  Fact Sheet for Patients:  EntrepreneurPulse.com.au  Fact Sheet for Healthcare Providers:  IncredibleEmployment.be  This test is no t yet approved or cleared by the [place] FDA and  has been authorized for detection and/or diagnosis of SARS-CoV-2 by FDA under an Emergency Use Authorization (EUA). This EUA will remain  in effect (meaning this test can be used) for the duration of the COVID-19 declaration under Section 564(b)(1) of the Act, 21 U.S.C.section 360bbb-3(b)(1), unless the authorization is terminated  or revoked sooner.       Influenza A by PCR NEGATIVE NEGATIVE Final   Influenza B by PCR NEGATIVE NEGATIVE Final    Comment: (NOTE) The Xpert Xpress SARS-CoV-2/FLU/RSV plus assay is intended as an aid  in the diagnosis of influenza from Nasopharyngeal swab specimens and should not be used as a sole basis for treatment. Nasal washings and aspirates are unacceptable for Xpert Xpress SARS-CoV-2/FLU/RSV testing.  Fact Sheet for Patients: BloggerCourse.com  Fact Sheet for Healthcare Providers: SeriousBroker.it  This test is not yet approved or cleared by the [place] FDA and has been authorized for detection and/or diagnosis of SARS-CoV-2 by FDA under an Emergency Use Authorization (EUA). This EUA will remain in effect (meaning this test can be used) for the duration of the COVID-19 declaration under  Section 564(b)(1) of the Act, 21 U.S.C. section 360bbb-3(b)(1), unless the authorization is terminated or revoked.  Performed at [facility], [address] [place], [place] [address]          Radiology Studies: No results found.      Scheduled Meds:  azithromycin  500 mg Oral Daily   cefdinir  300 mg Oral Q12H   enoxaparin (LOVENOX) injection  40 mg Subcutaneous Q24H   feeding supplement  237 mL Oral BID BM   levothyroxine  88 mcg Oral Q0600   Continuous Infusions:   LOS: 4 days    Time spent: 32 minutes    Marrion Coy, MD [facility]   To contact the attending provider between 7A-7P or the covering provider during after hours 7P-7A, please log into the web site [email or web address] and access using universal [place] password for that web site. If you do not have the password, please call the hospital operator.  05/05/2021, 9:40 AM

## 2021-05-05 NOTE — Progress Notes (Signed)
Occupational Therapy Treatment Patient Details Name: Zoe [name] MRN: [id number] DOB: 10-06-1926 Today's Date: 05/05/2021   History of present illness Zoe [name] is a 87 y.o. female seen in ed with complaints of AMS after fall 2 days ago. Pt has past medical history of macrocytic anemia, hypertension, diabetes mellitus type 2, complete heart block hypothyroidism, significant of high degree heart block status post pacemaker, aortic stenosis status post failed TAVR.  Patient chest x-ray showed left lower lobe consolidation consistent with pneumonia.   OT comments  Pt seen for OT tx this date to f/u re: safety with ADLs/ADL mobility. Pt is pleasantly confused. She is able to follow commands and is agreeable to session, but requires cues for sequencing and safety throughout as well as occaional cues for task initiation/termination such as with washing her face. She requires CGA to come to EOB sitting and MIN A for UB bathing/dressing in sitting. She requires MOD A to transfer to [facility] from regular bed height. For LB bathing, she CTS with MIN A from elevated BSC and requires MAX A for the actual ADL task. Pt demos decreased standing tolerance this date and only completes fxl mobility ~15' before her lower extremities are noted to shake. Pt returned to bed end of session with all needs met and in reach. Chair position with alarm set. RN/CNA updated on pt's session with OT. Continue to recommend SNF.   Recommendations for follow up therapy are one component of a multi-disciplinary discharge planning process, led by the attending physician.  Recommendations may be updated based on patient status, additional functional criteria and insurance authorization.    Follow Up Recommendations  Skilled nursing-short term rehab (<3 hours/day)    Assistance Recommended at Discharge Frequent or constant Supervision/Assistance  Equipment Recommendations  Other (comment) (defer)    Recommendations for Other  Services      Precautions / Restrictions Precautions Precautions: Fall Restrictions Weight Bearing Restrictions: No       Mobility Bed Mobility Overal bed mobility: Needs Assistance Bed Mobility: Supine to Sit;Sit to Supine     Supine to sit: Min guard;HOB elevated Sit to supine: Min assist;Mod assist        Transfers Overall transfer level: Needs assistance Equipment used: Rolling walker (2 wheels) Transfers: Sit to/from Stand Sit to Stand: Min assist;From elevated surface           General transfer comment: MOD A to stand from standard heigh surface, MIN A when bed elevated ~6-7 inches     Balance Overall balance assessment: Needs assistance Sitting-balance support: Bilateral upper extremity supported;Feet supported Sitting balance-Leahy Scale: Good     Standing balance support: Bilateral upper extremity supported Standing balance-Leahy Scale: Poor                             ADL either performed or assessed with clinical judgement   ADL Overall ADL's : Needs assistance/impaired     Grooming: Wash/dry face;Set up;Sitting;Cueing for sequencing   Upper Body Bathing: Minimal assistance;Sitting   Lower Body Bathing: Maximal assistance;Sit to/from stand;Cueing for sequencing   Upper Body Dressing : Minimal assistance;Sitting;Cueing for sequencing   Lower Body Dressing: Total assistance;Sitting/lateral leans Lower Body Dressing Details (indicate cue type and reason): socks Toilet Transfer: Moderate assistance;Rolling walker (2 wheels);BSC/3in1;Stand-pivot   Toileting- Clothing Manipulation and Hygiene: Moderate assistance;Sit to/from stand       Functional mobility during ADLs: Minimal assistance;Rolling walker (2 wheels);Cueing for safety  Extremity/Trunk Assessment              Vision       Perception     Praxis      Cognition Arousal/Alertness: Awake/alert Behavior During Therapy: WFL for tasks  assessed/performed Overall Cognitive Status: No family/caregiver present to determine baseline cognitive functioning                                 General Comments: pt only oriented to self, not her age her son's age, city, location, or any temporal concepts. She requires moderate cueing to follow most simple commands and benefits from tactile cues d/t HOH as well          Exercises Other Exercises Other Exercises: OT engages pt in toileting and bathing tasks   Shoulder Instructions       General Comments      Pertinent Vitals/ Pain       Pain Assessment: No/denies pain  Home Living                                          Prior Functioning/Environment              Frequency  Min 2X/week        Progress Toward Goals  OT Goals(current goals can now be found in the care plan section)  Progress towards OT goals: Progressing toward goals  Acute Rehab OT Goals Patient Stated Goal: none stated OT Goal Formulation: Patient unable to participate in goal setting Time For Goal Achievement: 05/17/21 Potential to Achieve Goals: Good  Plan Discharge plan remains appropriate    Co-evaluation                 AM-PAC OT "6 Clicks" Daily Activity     Outcome Measure   Help from another person eating meals?: None Help from another person taking care of personal grooming?: A Little Help from another person toileting, which includes using toliet, bedpan, or urinal?: A Lot Help from another person bathing (including washing, rinsing, drying)?: A Lot Help from another person to put on and taking off regular upper body clothing?: A Little Help from another person to put on and taking off regular lower body clothing?: A Lot 6 Click Score: 16    End of Session Equipment Utilized During Treatment: Gait belt;Rolling walker (2 wheels)  OT Visit Diagnosis: Unsteadiness on feet (R26.81);Muscle weakness (generalized) (M62.81);Other symptoms  and signs involving cognitive function   Activity Tolerance Patient tolerated treatment well   Patient Left in chair;with call bell/phone within reach;with chair alarm set   Nurse Communication Mobility status        Time: 7591-6384 OT Time Calculation (min): 40 min  Charges: OT General Charges $OT Visit: 1 Visit OT Treatments $Self Care/Home Management : 23-37 mins $Therapeutic Activity: 8-22 mins  Gerrianne Scale, MS, OTR/L ascom [phone] 05/05/21, 1:44 PM

## 2021-05-06 DIAGNOSIS — E039 Hypothyroidism, unspecified: Secondary | ICD-10-CM

## 2021-05-06 NOTE — Progress Notes (Signed)
°  Progress Note    [name]   YQM:[id number]  DOB: April 04, 1927  DOA: 05/01/2021     5 Date of Service: 05/06/2021   Clinical Course  Zoe [name] is a 87 y.o. female seen in ed with complaints of AMS after fall 2 days ago. Pt has past medical history of macrocytic anemia, hypertension, diabetes mellitus type 2, complete heart block hypothyroidism, significant of high degree heart block status post pacemaker, aortic stenosis status post failed TAVR. Patient chest x-ray showed left lower lobe consolidation consistent with pneumonia.  Patient was started on Rocephin and Zithromax.  12/18: Patient medically stable but [facility] cannot take him as the pharmacy is closed today per TOC.  Plan discharge on 12/19   Assessment and Plan * Acute metabolic encephalopathy Likely due to pneumonia.  Now improved.  Her mental status is back to baseline.  Left lower lobe pneumonia Improved with antibiotics.  Now on oral cefdinir to finish course tomorrow  AKI (acute kidney injury) (HCC) Improved with hydration.  She also had hyponatremia and hypokalemia which are replaced  Fall at home, initial encounter Has had multiple falls.  Likely multifactorial.  PT, OT recommends SNF where she is being discharged tomorrow.  [facility] at discharge  Diabetes mellitus, type 2 (HCC) Sliding scale insulin for now.  Hemoglobin A1c 6.6  H/O aortic valve replacement Status post failed TAVR.  Poor prognosis.  Also history of CAD  Adult hypothyroidism Continue Synthroid.  Patient was not taking Synthroid at home as evidenced by TSH of 73.62 and free T4 of less than 0.25 on admission  Essential (primary) hypertension Blood pressure remains low normal.  No need of blood pressure medicine     Subjective:  She seems back to her baseline mental status.  [facility] cannot take her today as the pharmacy is closed  Objective Vitals:   05/05/21 1947 05/06/21 0438 05/06/21 0750 05/06/21  1220  BP: 118/63 103/66 99/65 103/64  Pulse: 80 80 80 79  Resp: 20 18 19 17   Temp: 98.4 F (36.9 C) 98.6 F (37 C) 98.4 F (36.9 C) 98 F (36.7 C)  TempSrc: Oral Oral Oral Oral  SpO2: 97% 100% 95% 94%  Weight:      Height:       86.1 kg  Vital signs were reviewed and unremarkable.   Exam Physical Exam   General exam: Appears calm and comfortable  Respiratory system: Clear to auscultation. Respiratory effort normal. Cardiovascular system: S1 & S2 heard, RRR. No JVD, murmurs, rubs, gallops or clicks. No pedal edema. Gastrointestinal system: Abdomen is nondistended, soft and nontender. No organomegaly or masses felt. Normal bowel sounds heard. Central nervous system: Alert and oriented x3. No focal neurological deficits. Extremities: Symmetric 5 x 5 power. Skin: No rashes, lesions or ulcers Psychiatry: Mood & affect appropriate.   Labs / Other Information My review of labs, imaging, notes and other tests shows no new significant findings.    Disposition Plan: Status is: Inpatient  Remains inpatient appropriate because: Patient is medically stable for discharge.  [facility] is unable to accept her today due to the pharmacy being closed.  Likely discharge tomorrow.  TOC aware.        Time spent: 35 minutes [facility] 05/06/2021, 12:39 PM

## 2021-05-06 NOTE — Assessment & Plan Note (Signed)
Likely due to pneumonia.  Now improved.  Her mental status is back to baseline.

## 2021-05-06 NOTE — Assessment & Plan Note (Addendum)
Improved with antibiotics.  Now on oral cefdinir to finish course tomorrow

## 2021-05-06 NOTE — Assessment & Plan Note (Signed)
Continue Synthroid.  Patient was not taking Synthroid at home as evidenced by TSH of 73.62 and free T4 of less than 0.25 on admission

## 2021-05-06 NOTE — Assessment & Plan Note (Signed)
Has had multiple falls.  Likely multifactorial.  PT, OT recommends SNF where she is being discharged tomorrow.  [facility] at discharge

## 2021-05-06 NOTE — Assessment & Plan Note (Signed)
Status post failed TAVR.  Poor prognosis.  Also history of CAD

## 2021-05-06 NOTE — Assessment & Plan Note (Signed)
Blood pressure remains low normal.  No need of blood pressure medicine

## 2021-05-06 NOTE — Hospital Course (Signed)
Zoe [name] is a 87 y.o. female seen in ed with complaints of AMS after fall 2 days ago. Pt has past medical history of macrocytic anemia, hypertension, diabetes mellitus type 2, complete heart block hypothyroidism, significant of high degree heart block status post pacemaker, aortic stenosis status post failed TAVR. Patient chest x-ray showed left lower lobe consolidation consistent with pneumonia.  Patient was started on Rocephin and Zithromax.  12/18: Patient medically stable but [facility] cannot take him as the pharmacy is closed today per TOC.  Plan discharge on 12/19

## 2021-05-06 NOTE — Assessment & Plan Note (Addendum)
Sliding scale insulin for now.  Hemoglobin A1c 6.6

## 2021-05-06 NOTE — Assessment & Plan Note (Signed)
Improved with hydration.  She also had hyponatremia and hypokalemia which are replaced

## 2021-05-06 NOTE — TOC Progression Note (Signed)
Transition of Care [facility]) - Progression Note    Patient Details  Name: ANERI SLAGEL MRN: [id number] Date of Birth: [birth year]  Transition [facility]) CM/SW Contact  Maree Krabbe, LCSW Phone Number: 05/06/2021, 10:41 AM  Clinical Narrative:   [facility] Harvey obtained for admission to [facility]. Covid test ordered. CSW reached out to [facility] and Tammy states they can not take on Sundays because the pharmacy is closed. Anticipate admit to SNF tomorrow. MD updated.       Barriers to Discharge: Continued Medical Work up  Expected Discharge Plan and Services         Living arrangements for the past 2 months: Single Family Home                                       Social Determinants of Health (SDOH) Interventions    Readmission Risk Interventions No flowsheet data found.

## 2021-05-07 LAB — TSH: TSH: 71 u[IU]/mL — ABNORMAL HIGH (ref 0.350–4.500)

## 2021-05-07 LAB — SARS CORONAVIRUS 2 (TAT 6-24 HRS): SARS Coronavirus 2: NEGATIVE

## 2021-05-07 MED ORDER — LEVOTHYROXINE SODIUM 88 MCG PO TABS: 88.0000 ug | ORAL_TABLET | Freq: Every day | ORAL | 0 refills | Status: AC

## 2021-05-07 NOTE — Discharge Summary (Signed)
Physician Discharge Summary   Patient name: Zoe [name] [facility]  Admit date:     05/01/2021  Discharge date: 05/07/2021  Discharge Physician: Delfino Lovett   PCP: Patient, No Pcp Per (Inactive)   Recommendations at discharge: Follow-up with outpatient providers as requested  Discharge Diagnoses Principal Problem:   Acute metabolic encephalopathy Active Problems:   Essential (primary) hypertension   Adult hypothyroidism   MI (mitral incompetence)   H/O aortic valve replacement   Diabetes mellitus, type 2 (HCC)   Fall at home, initial encounter   AMS (altered mental status)   AKI (acute kidney injury) (HCC)   Left lower lobe pneumonia  Hospital Course    Zoe [name] is a 87 y.o. female seen in ed with complaints of AMS after fall 2 days ago. Pt has past medical history of macrocytic anemia, hypertension, diabetes mellitus type 2, complete heart block hypothyroidism, significant of high degree heart block status post pacemaker, aortic stenosis status post failed TAVR. Patient chest x-ray showed left lower lobe consolidation consistent with pneumonia.  Patient was started on Rocephin and Zithromax.    Assessment and Plan * Acute metabolic encephalopathy Likely due to pneumonia.  Now improved.  Her mental status is back to baseline.   Left lower lobe pneumonia Completed course of antibiotics   AKI (acute kidney injury) (HCC) Improved with hydration.  She also had hyponatremia and hypokalemia which are replaced   Fall at home, initial encounter Has had multiple falls.  Likely multifactorial.  PT, OT recommends SNF where she is being discharged.  [facility] at discharge   Diabetes mellitus, type 2 (HCC) Sliding scale insulin while in the hospital.  Hemoglobin A1c 6.6   H/O aortic valve replacement Status post failed TAVR.  Poor prognosis.  Also history of CAD   Adult hypothyroidism Continue Synthroid.  Patient was not taking Synthroid at home as evidenced by TSH of  73.62 and free T4 of less than 0.25 on admission.  Recheck TSH is 71 which is trending in the right direction.  Recommend continuing Synthroid at discharge and rechecking TSH in about 4 to 6 weeks as an outpatient   Essential (primary) hypertension Blood pressure remains low normal.  No need of blood pressure medicine      Procedures performed: None  Condition at discharge: stable  Exam Physical Exam   General exam: Appears calm and comfortable  Respiratory system: Clear to auscultation. Respiratory effort normal. Cardiovascular system: S1 & S2 heard, RRR. No JVD, murmurs, rubs, gallops or clicks. No pedal edema. Gastrointestinal system: Abdomen is nondistended, soft and nontender. No organomegaly or masses felt. Normal bowel sounds heard. Central nervous system: Alert and oriented x3. No focal neurological deficits. Extremities: Symmetric 5 x 5 power. Skin: No rashes, lesions or ulcers Psychiatry: Mood & affect appropriate.   Disposition: Skilled nursing facility  Discharge time: greater than 30 minutes.  Contact information for after-discharge care     Destination     [facility] Preferred SNF .   Service: Skilled Nursing Contact information: [address] [place] [address] [phone]                     Allergies as of 05/07/2021   No Known Allergies      Medication List     STOP taking these medications    acetaminophen 325 MG tablet Commonly known as: TYLENOL   enoxaparin 40 MG/0.4ML injection Commonly known as: LOVENOX   famotidine 20  MG tablet Commonly known as: PEPCID   metoprolol succinate 50 MG 24 hr tablet Commonly known as: TOPROL-XL   omega-3 acid ethyl esters 1 g capsule Commonly known as: LOVAZA   Osteo Bi-Flex Regular Strength 250-200 MG Tabs Generic drug: Glucosamine-Chondroitin   oxyCODONE 5 MG immediate release tablet Commonly known as: Oxy IR/ROXICODONE   pioglitazone 15 MG  tablet Commonly known as: ACTOS   Vitamin D (Ergocalciferol) 1.25 MG (50000 UNIT) Caps capsule Commonly known as: DRISDOL       TAKE these medications    levothyroxine 88 MCG tablet Commonly known as: SYNTHROID Take 1 tablet (88 mcg total) by mouth daily at 6 (six) AM. What changed:  when to take this additional instructions        DG Chest 2 View  Result Date: 05/01/2021 CLINICAL DATA:  Weakness EXAM: CHEST - 2 VIEW COMPARISON:  05/28/2019 FINDINGS: Chronic elevation of the right hemidiaphragm. Right base atelectasis. Consolidation in the left lower lobe concerning for pneumonia. Left pacer is in place, unchanged. Heart is normal size. Prior valve replacement. Aortic atherosclerosis. IMPRESSION: Consolidation in the left lower lung compatible with pneumonia. Stable chronic elevation of the right hemidiaphragm with right base atelectasis. Electronically Signed   By: Charlett Nose M.D.   On: 05/01/2021 19:37   CT Head Wo Contrast  Result Date: 05/01/2021 CLINICAL DATA:  Head trauma fall EXAM: CT HEAD WITHOUT CONTRAST CT CERVICAL SPINE WITHOUT CONTRAST TECHNIQUE: Multidetector CT imaging of the head and cervical spine was performed following the standard protocol without intravenous contrast. Multiplanar CT image reconstructions of the cervical spine were also generated. COMPARISON:  CT brain 05/26/2014 FINDINGS: CT HEAD FINDINGS Brain: No acute territorial infarction, hemorrhage, or intracranial mass. Moderate atrophy. Moderate chronic small vessel ischemic changes of the white matter. 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 Nonenlarged ventricles Vascular: No hyperdense vessels.  Carotid vascular calcification Skull: Normal. Negative for fracture or focal lesion. Sinuses/Orbits: No acute finding. Other: None CT CERVICAL SPINE FINDINGS Alignment: No subluxation.  Facet alignment within normal limits. Skull base and vertebrae: No acute fracture. No primary bone lesion or focal  pathologic process. Soft tissues and spinal canal: No prevertebral fluid or swelling. No visible canal hematoma. Disc levels: Moderate disc space narrowing and degenerative change C4-C5 and C6-C7. Facet degenerative changes at multiple levels. Upper chest: Negative. Other: None IMPRESSION: 1. No CT evidence for acute intracranial abnormality. Atrophy and chronic small vessel ischemic changes of the white matter 2. Degenerative changes of the cervical spine. No acute osseous abnormality. Electronically Signed   By: Jasmine Pang M.D.   On: 05/01/2021 20:04   CT Cervical Spine Wo Contrast  Result Date: 05/01/2021 CLINICAL DATA:  Head trauma fall EXAM: CT HEAD WITHOUT CONTRAST CT CERVICAL SPINE WITHOUT CONTRAST TECHNIQUE: Multidetector CT imaging of the head and cervical spine was performed following the standard protocol without intravenous contrast. Multiplanar CT image reconstructions of the cervical spine were also generated. COMPARISON:  CT brain 05/26/2014 FINDINGS: CT HEAD FINDINGS Brain: No acute territorial infarction, hemorrhage, or intracranial mass. Moderate atrophy. Moderate chronic small vessel ischemic changes of the white matter. 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 Nonenlarged ventricles Vascular: No hyperdense vessels.  Carotid vascular calcification Skull: Normal. Negative for fracture or focal lesion. Sinuses/Orbits: No acute finding. Other: None CT CERVICAL SPINE FINDINGS Alignment: No subluxation.  Facet alignment within normal limits. Skull base and vertebrae: No acute fracture. No primary bone lesion or focal pathologic process. Soft tissues and spinal canal:  No prevertebral fluid or swelling. No visible canal hematoma. Disc levels: Moderate disc space narrowing and degenerative change C4-C5 and C6-C7. Facet degenerative changes at multiple levels. Upper chest: Negative. Other: None IMPRESSION: 1. No CT evidence for acute intracranial abnormality. Atrophy and chronic small vessel  ischemic changes of the white matter 2. Degenerative changes of the cervical spine. No acute osseous abnormality. Electronically Signed   By: Jasmine Pang M.D.   On: 05/01/2021 20:04   Results for orders placed or performed during the hospital encounter of 05/01/21  Resp Panel by RT-PCR (Flu A&B, Covid)     Status: None   Collection Time: 05/01/21  7:56 PM   Specimen: Nasopharyngeal(NP) swabs in vial transport medium  Result Value Ref Range Status   SARS Coronavirus 2 by RT PCR NEGATIVE NEGATIVE Final    Comment: (NOTE) SARS-CoV-2 target nucleic acids are NOT DETECTED.  The SARS-CoV-2 RNA is generally detectable in upper respiratory specimens during the acute phase of infection. The lowest concentration of SARS-CoV-2 viral copies this assay can detect is 138 copies/mL. A negative result does not preclude SARS-Cov-2 infection and should not be used as the sole basis for treatment or other patient management decisions. A negative result may occur with  improper specimen collection/handling, submission of specimen other than nasopharyngeal swab, presence of viral mutation(s) within the areas targeted by this assay, and inadequate number of viral copies(<138 copies/mL). A negative result must be combined with clinical observations, patient history, and epidemiological information. The expected result is Negative.  Fact Sheet for Patients:  BloggerCourse.com  Fact Sheet for Healthcare Providers:  SeriousBroker.it  This test is no t yet approved or cleared by the [place] FDA and  has been authorized for detection and/or diagnosis of SARS-CoV-2 by FDA under an Emergency Use Authorization (EUA). This EUA will remain  in effect (meaning this test can be used) for the duration of the COVID-19 declaration under Section 564(b)(1) of the Act, 21 U.S.C.section 360bbb-3(b)(1), unless the authorization is terminated  or revoked sooner.        Influenza A by PCR NEGATIVE NEGATIVE Final   Influenza B by PCR NEGATIVE NEGATIVE Final    Comment: (NOTE) The Xpert Xpress SARS-CoV-2/FLU/RSV plus assay is intended as an aid in the diagnosis of influenza from Nasopharyngeal swab specimens and should not be used as a sole basis for treatment. Nasal washings and aspirates are unacceptable for Xpert Xpress SARS-CoV-2/FLU/RSV testing.  Fact Sheet for Patients: BloggerCourse.com  Fact Sheet for Healthcare Providers: SeriousBroker.it  This test is not yet approved or cleared by the [place] FDA and has been authorized for detection and/or diagnosis of SARS-CoV-2 by FDA under an Emergency Use Authorization (EUA). This EUA will remain in effect (meaning this test can be used) for the duration of the COVID-19 declaration under Section 564(b)(1) of the Act, 21 U.S.C. section 360bbb-3(b)(1), unless the authorization is terminated or revoked.  Performed at [facility], [address] [place], [place] [address]   SARS CORONAVIRUS 2 (TAT 6-24 HRS) Nasopharyngeal Nasopharyngeal Swab     Status: None   Collection Time: 05/06/21  7:40 AM   Specimen: Nasopharyngeal Swab  Result Value Ref Range Status   SARS Coronavirus 2 NEGATIVE NEGATIVE Final    Comment: (NOTE) SARS-CoV-2 target nucleic acids are NOT DETECTED.  The SARS-CoV-2 RNA is generally detectable in upper and lower respiratory specimens during the acute phase of infection. Negative results do not preclude SARS-CoV-2 infection, do not rule out co-infections with  other pathogens, and should not be used as the sole basis for treatment or other patient management decisions. Negative results must be combined with clinical observations, patient history, and epidemiological information. The expected result is Negative.  Fact Sheet for Patients: HairSlick.no  Fact Sheet for Healthcare  Providers: quierodirigir.com  This test is not yet approved or cleared by the [place] FDA and  has been authorized for detection and/or diagnosis of SARS-CoV-2 by FDA under an Emergency Use Authorization (EUA). This EUA will remain  in effect (meaning this test can be used) for the duration of the COVID-19 declaration under Se ction 564(b)(1) of the Act, 21 U.S.C. section 360bbb-3(b)(1), unless the authorization is terminated or revoked sooner.  Performed at [facility], [address], [place], [place] [address]     Signed:  Delfino Lovett MD.  [facility] 05/07/2021, 4:00 PM

## 2021-05-07 NOTE — Plan of Care (Signed)

## 2021-05-07 NOTE — Progress Notes (Signed)
[facility] Liaison note:  This is a pending outpatient-based Palliative Care patient. Will continue to follow for disposition.  Please call with any outpatient palliative questions or concerns.  Thank you, Dee Curry, LPN [facility] Hospital Liaison [phone] 

## 2021-05-07 NOTE — Care Management Important Message (Signed)
Important Message  Patient Details  Name: Zoe [name] MRN: [id number] Date of Birth: 12/29/1926   Medicare Important Message Given:  Yes     Olegario Messier A [name] 05/07/2021, 11:04 AM

## 2021-05-07 NOTE — TOC Progression Note (Signed)
Transition of Care [facility]) - Progression Note    Patient Details  Name: Zoe [name] MRN: [id number] Date of Birth: Aug 17, 1926  Transition of Care [facility]) CM/SW Contact  Caryn Section, RN Phone Number: 05/07/2021, 2:39 PM  Clinical Narrative:   Patient to go to [facility] today via EMS patient 4th on list COVID negative.  As per Tam[facility] at [facility], room 701.  Family aware       Barriers to Discharge: Continued Medical Work up  Expected Discharge Plan and Services         Living arrangements for the past 2 months: Single Family Home Expected Discharge Date: 05/07/21                                     Social Determinants of Health (SDOH) Interventions    Readmission Risk Interventions No flowsheet data found.

## 2021-05-07 NOTE — Progress Notes (Signed)
PT Cancellation Note  Patient Details Name: Zoe [name] MRN: [id number] DOB: 12/01/1926   Cancelled Treatment:    Reason Eval/Treat Not Completed: Other (comment). Per case manager and RN, transportation has been arranged for pt to d/c to [facility] today.   Basilia Jumbo PT, DPT 05/07/21 4:33 PM [phone]

## 2022-07-26 ENCOUNTER — Emergency Department: Payer: Medicare Other

## 2022-07-26 ENCOUNTER — Inpatient Hospital Stay
Admission: EM | Admit: 2022-07-26 | Discharge: 2022-07-29 | DRG: 956 | Disposition: A | Discharge status: Skilled Nursing Facility | Payer: Medicare Other | Attending: Internal Medicine | Admitting: Internal Medicine

## 2022-07-26 DIAGNOSIS — Z87891 Personal history of nicotine dependence: Secondary | ICD-10-CM | POA: Diagnosis not present

## 2022-07-26 DIAGNOSIS — W01198A Fall on same level from slipping, tripping and stumbling with subsequent striking against other object, initial encounter: Secondary | ICD-10-CM | POA: Diagnosis present

## 2022-07-26 DIAGNOSIS — E785 Hyperlipidemia, unspecified: Secondary | ICD-10-CM | POA: Diagnosis present

## 2022-07-26 DIAGNOSIS — Z79890 Hormone replacement therapy (postmenopausal): Secondary | ICD-10-CM | POA: Diagnosis not present

## 2022-07-26 DIAGNOSIS — E119 Type 2 diabetes mellitus without complications: Secondary | ICD-10-CM

## 2022-07-26 DIAGNOSIS — D7589 Other specified diseases of blood and blood-forming organs: Secondary | ICD-10-CM | POA: Diagnosis present

## 2022-07-26 DIAGNOSIS — Z950 Presence of cardiac pacemaker: Secondary | ICD-10-CM | POA: Diagnosis not present

## 2022-07-26 DIAGNOSIS — S065XAA Traumatic subdural hemorrhage with loss of consciousness status unknown, initial encounter: Secondary | ICD-10-CM | POA: Diagnosis not present

## 2022-07-26 DIAGNOSIS — S72141A Displaced intertrochanteric fracture of right femur, initial encounter for closed fracture: Principal | ICD-10-CM | POA: Diagnosis present

## 2022-07-26 DIAGNOSIS — I10 Essential (primary) hypertension: Secondary | ICD-10-CM

## 2022-07-26 DIAGNOSIS — E039 Hypothyroidism, unspecified: Secondary | ICD-10-CM | POA: Diagnosis present

## 2022-07-26 DIAGNOSIS — F0390 Unspecified dementia without behavioral disturbance: Secondary | ICD-10-CM

## 2022-07-26 DIAGNOSIS — W19XXXA Unspecified fall, initial encounter: Secondary | ICD-10-CM | POA: Diagnosis not present

## 2022-07-26 DIAGNOSIS — Z66 Do not resuscitate: Secondary | ICD-10-CM | POA: Diagnosis present

## 2022-07-26 DIAGNOSIS — M1990 Unspecified osteoarthritis, unspecified site: Secondary | ICD-10-CM | POA: Diagnosis present

## 2022-07-26 DIAGNOSIS — I6203 Nontraumatic chronic subdural hemorrhage: Secondary | ICD-10-CM | POA: Diagnosis present

## 2022-07-26 DIAGNOSIS — S065X0A Traumatic subdural hemorrhage without loss of consciousness, initial encounter: Secondary | ICD-10-CM | POA: Diagnosis present

## 2022-07-26 DIAGNOSIS — S72001A Fracture of unspecified part of neck of right femur, initial encounter for closed fracture: Secondary | ICD-10-CM

## 2022-07-26 HISTORY — DX: Unspecified dementia, unspecified severity, without behavioral disturbance, psychotic disturbance, mood disturbance, and anxiety: F03.90

## 2022-07-26 LAB — CBC WITH DIFFERENTIAL/PLATELET
Abs Immature Granulocytes: 0.03 10*3/uL (ref 0.00–0.07)
Basophils Absolute: 0 10*3/uL (ref 0.0–0.1)
Basophils Relative: 0 %
Eosinophils Absolute: 0.1 10*3/uL (ref 0.0–0.5)
Eosinophils Relative: 1 %
HCT: 39.2 % (ref 36.0–46.0)
Hemoglobin: 12.3 g/dL (ref 12.0–15.0)
Immature Granulocytes: 1 %
Lymphocytes Relative: 26 %
Lymphs Abs: 1.6 10*3/uL (ref 0.7–4.0)
MCH: 31.5 pg (ref 26.0–34.0)
MCHC: 31.4 g/dL (ref 30.0–36.0)
MCV: 100.5 fL — ABNORMAL HIGH (ref 80.0–100.0)
Monocytes Absolute: 0.5 10*3/uL (ref 0.1–1.0)
Monocytes Relative: 9 %
Neutro Abs: 3.7 10*3/uL (ref 1.7–7.7)
Neutrophils Relative %: 63 %
Platelets: 177 10*3/uL (ref 150–400)
RBC: 3.9 MIL/uL (ref 3.87–5.11)
RDW: 14.3 % (ref 11.5–15.5)
WBC: 5.9 10*3/uL (ref 4.0–10.5)
nRBC: 0 % (ref 0.0–0.2)

## 2022-07-26 LAB — BASIC METABOLIC PANEL
Anion gap: 9 (ref 5–15)
BUN: 22 mg/dL (ref 8–23)
CO2: 27 mmol/L (ref 22–32)
Calcium: 10.5 mg/dL — ABNORMAL HIGH (ref 8.9–10.3)
Chloride: 101 mmol/L (ref 98–111)
Creatinine, Ser: 0.9 mg/dL (ref 0.44–1.00)
GFR, Estimated: 60 mL/min — ABNORMAL LOW (ref >60)
Glucose, Bld: 151 mg/dL — ABNORMAL HIGH (ref 70–99)
Potassium: 4.2 mmol/L (ref 3.5–5.1)
Sodium: 137 mmol/L (ref 135–145)

## 2022-07-26 LAB — TROPONIN I (HIGH SENSITIVITY): Troponin I (High Sensitivity): 5 ng/L (ref <18)

## 2022-07-26 MED ORDER — SODIUM CHLORIDE 0.9 % IV SOLN: INTRAVENOUS | Status: DC

## 2022-07-26 MED ORDER — LEVOTHYROXINE SODIUM 88 MCG PO TABS
88.0000 ug | ORAL_TABLET | Freq: Every day | ORAL | Status: DC
  Administered 2022-07-27 – 2022-07-29 (×3): 88 ug via ORAL
  Filled 2022-07-26 (×3): qty 1

## 2022-07-26 MED ORDER — TRAZODONE HCL 50 MG PO TABS
25.0000 mg | ORAL_TABLET | Freq: Every evening | ORAL | Status: DC | PRN
  Administered 2022-07-27 – 2022-07-28 (×2): 25 mg via ORAL
  Filled 2022-07-26 (×2): qty 1

## 2022-07-26 MED ORDER — MAGNESIUM HYDROXIDE 400 MG/5ML PO SUSP: 30.0000 mL | Freq: Every day | ORAL | Status: DC | PRN

## 2022-07-26 MED ORDER — ACETAMINOPHEN 325 MG PO TABS: 650.0000 mg | ORAL_TABLET | ORAL | Status: DC | PRN

## 2022-07-26 MED ORDER — ONDANSETRON HCL 4 MG/2ML IJ SOLN
4.0000 mg | INTRAMUSCULAR | Status: DC | PRN
  Administered 2022-07-26: 4 mg via INTRAVENOUS
  Filled 2022-07-26: qty 2

## 2022-07-26 MED ORDER — MORPHINE SULFATE (PF) 2 MG/ML IV SOLN
0.5000 mg | INTRAVENOUS | Status: DC | PRN
  Administered 2022-07-26: 0.5 mg via INTRAVENOUS
  Filled 2022-07-26: qty 1

## 2022-07-26 MED ORDER — HYDROCODONE-ACETAMINOPHEN 5-325 MG PO TABS: 1.0000 | ORAL_TABLET | Freq: Four times a day (QID) | ORAL | Status: DC | PRN

## 2022-07-26 NOTE — ED Triage Notes (Signed)
Pt from home BIB [facility] for fall, reported "felt a pop in her hip" which made her fall, pt fell to the found, no LOC, no dizziness, denies hitting her head. Pt with noticeable shortening and outward rotation to RLE, pt c/o right hip pain. EMS gave dose 50 mcg fentanyl and 250 mg bolus NS. Pt alert on arrival, demented at baseline,  VSS. CNS intact 1+ pedal pulse

## 2022-07-26 NOTE — Assessment & Plan Note (Addendum)
Ortho consulted -- taking to OR later today NPO Post-op PT evaluation Pain control PRN Bowel regimen DVT prophylaxis post-op per ortho

## 2022-07-26 NOTE — ED Provider Notes (Addendum)
[facility] Provider Note    Event Date/Time   First MD Initiated Contact with Patient 07/26/22 2132     (approximate)   History   Fall   HPI  Zoe [name] is a 89 y.o. female with diabetes, hypertension, anemia, heart block with pacemaker who comes in with concern for a fall.  Patient reports that she lives with a family member.  She reports feeling a pop in her right hip which then caused her to fall down.  She initially stated that she did not hit her head but then she later told me that she did hit her head on the door .  She only reports pain on her right leg.  Physical Exam   Triage Vital Signs: ED Triage Vitals  Enc Vitals Group     BP      Pulse      Resp      Temp      Temp src      SpO2      Weight      Height      Head Circumference      Peak Flow      Pain Score      Pain Loc      Pain Edu?      Excl. in [place]?     Most recent vital signs: Vitals:   07/26/22 2200 07/26/22 2230  BP: (!) 147/97   Pulse: 78 90  Resp: (!) 27 19  Temp:    SpO2: 98% 96%     General: Awake, no distress.  CV:  Good peripheral perfusion.  Resp:  Normal effort.  Abd:  No distention.  Other:  Patient has pain on her right hip with externally rotated leg good distal pulse sensation intact.   ED Results / Procedures / Treatments   Labs (all labs ordered are listed, but only abnormal results are displayed) Labs Reviewed  CBC WITH DIFFERENTIAL/PLATELET - Abnormal; Notable for the following components:      Result Value   MCV 100.5 (*)    All other components within normal limits  BASIC METABOLIC PANEL - Abnormal; Notable for the following components:   Glucose, Bld 151 (*)    Calcium 10.5 (*)    GFR, Estimated 60 (*)    All other components within normal limits  CBC  BASIC METABOLIC PANEL  TYPE AND SCREEN  TROPONIN I (HIGH SENSITIVITY)     EKG  My interpretation of EKG:  Ectopic atrial rhythm 82 without any ST elevation or T wave  inversions with right bundle branch block and left anterior fascicular block  RADIOLOGY I have reviewed the xray personally and interpreted patient has right hip fracture  PROCEDURES:  Critical Care performed: No  .1-3 Lead EKG Interpretation  Performed by: Vanessa [place], MD Authorized by: Vanessa [place], MD     Interpretation: normal     ECG rate:  90   ECG rate assessment: normal     Rhythm: sinus rhythm     Ectopy: none     Conduction: normal      MEDICATIONS ORDERED IN ED: Medications - No data to display   IMPRESSION / MDM / [place] / ED COURSE  I reviewed the triage vital signs and the nursing notes.   Patient's presentation is most consistent with acute presentation with potential threat to life or bodily function.   Patient comes in with a fall.  Sounds mechanical  in nature with patient not the best historian so then we will so we will get preop labs, EKG.  X-ray to evaluate for fracture, dislocation CT imaging evaluate for any intracranial hemorrhage, cervical fracture  .  CBC reassuring.  BMP slightly elevated calcium troponin negative.  X-ray does confirm fracture.  I did let Dr. Mack Guise know about patient.  Unclear blood thinner status patient after this historian but no blood thinners noted from my review of records..  I will discuss the hospital team for admission.  CT imaging was consistent with a subdural hematoma.  I did discuss the case with Dr. Amedeo Kinsman to repeat CT head in 6 hours from initial 1 if staying stable he will see patient in the morning to clear for surgery  The patient is on the cardiac monitor to evaluate for evidence of arrhythmia and/or significant heart rate changes.      FINAL CLINICAL IMPRESSION(S) / ED DIAGNOSES   Final diagnoses:  Fall, initial encounter  Closed fracture of right hip, initial encounter [facility])     Rx / DC Orders   ED Discharge Orders     None        Note:  This document was prepared  using Dragon voice recognition software and may include unintentional dictation errors.   Vanessa Turkey, MD 07/26/22 2256    Vanessa Benjamin Perez, MD 07/26/22 2329

## 2022-07-26 NOTE — ED Notes (Signed)
Report given to Andrew RN

## 2022-07-26 NOTE — Assessment & Plan Note (Signed)
Continue Synthroid °

## 2022-07-26 NOTE — H&P (Addendum)
[place]   PATIENT NAMEDenay [name]    MR#:  [id number]  DATE OF BIRTH:  [birth year]  DATE OF ADMISSION:  07/26/2022  PRIMARY CARE PHYSICIAN: Pcp, No   Patient is coming from: Home  REQUESTING/REFERRING PHYSICIAN: Marjean Donna, MD  CHIEF COMPLAINT:   Chief Complaint  Patient presents with   Fall    HISTORY OF PRESENT ILLNESS:  Zoe [name] is a 89 y.o. female with medical history significant for type 2 diabetes mellitus, hypertension, hypothyroidism dementia and osteoarthritis and pacemaker placement, who presented to the ER with acute onset of fall.  The patient reportedly lives with a family member reports feeling a pop in her right hip that then caused her to fall down.  She did not have any head injury but admitted to hitting her head on the door.  She was having right lower extremity pain.  ED Course: When she came to the ER BP was 143/82 with a respiratory rate of 21 with otherwise normal vital signs.  Labs revealed blood glucose 151 with calcium 10.5 and high sensitive troponin 5.  CBC showed macrocytosis.  EKG as reviewed by me : EKG showed paced rhythm with a rate of 82 with PVCs Imaging: Portable chest x-ray showed the following: 1. Focal airspace opacity in the right lower lung, favor atelectasis though pneumonia is not excluded. Follow-up in 4-6 weeks is recommended to ensure resolution. 2. No displaced rib fractures. Right hip x-ray showed mildly displaced comminuted intertrochanteric fracture of the right proximal femur.  The patient be admitted to a medical bed for further evaluation and management. PAST MEDICAL HISTORY:   Past Medical History:  Diagnosis Date   Arthritis    Dementia ([place])    Diabetes mellitus without complication (HCC)   GERD, dyslipidemia, hypothyroidism and hypertension PAST SURGICAL HISTORY:   Past Surgical History:  Procedure Laterality Date   CARDIAC SURGERY  2015   OPEN REDUCTION INTERNAL FIXATION (ORIF)  TIBIA/FIBULA FRACTURE Right 05/29/2019   Procedure: OPEN REDUCTION INTERNAL FIXATION (ORIF) TIBIA/FIBULA FRACTURE;  Surgeon: Corky Mull, MD;  Location: [facility] ORS;  Service: Orthopedics;  Laterality: Right;    SOCIAL HISTORY:   Social History   Tobacco Use   Smoking status: Former    Packs/day: 0.25    Years: 5.00    Total pack years: 1.25    Types: Cigarettes    Quit date: 05/22/1968    Years since quitting: 54.2   Smokeless tobacco: Never  Substance Use Topics   Alcohol use: Never    FAMILY HISTORY:  History reviewed. No pertinent family history.  DRUG ALLERGIES:  No Known Allergies  REVIEW OF SYSTEMS:   ROS As per history of present illness. All pertinent systems were reviewed above. Constitutional, HEENT, cardiovascular, respiratory, GI, GU, musculoskeletal, neuro, psychiatric, endocrine, integumentary and hematologic systems were reviewed and are otherwise negative/unremarkable except for positive findings mentioned above in the HPI.   MEDICATIONS AT HOME:   Prior to Admission medications   Medication Sig Start Date End Date Taking? Authorizing Provider  levothyroxine (SYNTHROID) 88 MCG tablet Take 1 tablet (88 mcg total) by mouth daily at 6 (six) AM. 05/07/21 06/06/21  Max Sane, MD      VITAL SIGNS:  Blood pressure (!) 147/97, pulse 90, temperature 97.6 F (36.4 C), temperature source Oral, resp. rate 19, height '5\' 8"'$  (1.727 m), weight 83.9 kg, SpO2 96 %.  PHYSICAL EXAMINATION:  Physical Exam  GENERAL:  89 y.o.-year-old female patient lying  in the bed with no acute distress.  EYES: Pupils equal, round, reactive to light and accommodation. No scleral icterus. Extraocular muscles intact.  HEENT: Head atraumatic, normocephalic. Oropharynx and nasopharynx clear.  NECK:  Supple, no jugular venous distention. No thyroid enlargement, no tenderness.  LUNGS: Normal breath sounds bilaterally, no wheezing, rales,rhonchi or crepitation. No use of accessory muscles of  respiration.  CARDIOVASCULAR: Regular rate and rhythm, S1, S2 normal. No murmurs, rubs, or gallops.  ABDOMEN: Soft, nondistended, nontender. Bowel sounds present. No organomegaly or mass.  EXTREMITIES: No pedal edema, cyanosis, or clubbing. Musculoskeletal: Right lateral hip tenderness. NEUROLOGIC: Cranial nerves II through XII are intact. Muscle strength 5/5 in all extremities. Sensation intact. Gait not checked.  PSYCHIATRIC: The patient is alert and oriented x 3.  Normal affect and good eye contact. SKIN: No obvious rash, lesion, or ulcer.   LABORATORY PANEL:   CBC Recent Labs  Lab 07/26/22 2144  WBC 5.9  HGB 12.3  HCT 39.2  PLT 177   ------------------------------------------------------------------------------------------------------------------  Chemistries  Recent Labs  Lab 07/26/22 2144  NA 137  K 4.2  CL 101  CO2 27  GLUCOSE 151*  BUN 22  CREATININE 0.90  CALCIUM 10.5*   ------------------------------------------------------------------------------------------------------------------  Cardiac Enzymes No results for input(s): "TROPONINI" in the last 168 hours. ------------------------------------------------------------------------------------------------------------------  RADIOLOGY:  CT Cervical Spine Wo Contrast  Result Date: 07/26/2022 CLINICAL DATA:  Trauma EXAM: CT CERVICAL SPINE WITHOUT CONTRAST TECHNIQUE: Multidetector CT imaging of the cervical spine was performed without intravenous contrast. Multiplanar CT image reconstructions were also generated. RADIATION DOSE REDUCTION: This exam was performed according to the departmental dose-optimization program which includes automated exposure control, adjustment of the mA and/or kV according to patient size and/or use of iterative reconstruction technique. COMPARISON:  Cervical spine CT 05/01/2021 FINDINGS: Alignment: Normal. Skull base and vertebrae: No acute fracture. No primary bone lesion or focal pathologic  process. Soft tissues and spinal canal: No prevertebral fluid or swelling. No visible canal hematoma. Disc levels: Disc bulge at C5-C6 causes mild central canal stenosis. Otherwise, no significant central canal or neural foraminal stenosis. There are mild degenerative changes of disc spaces throughout the cervical spine. There also mild degenerative facet changes throughout the cervical spine. Upper chest: Negative. Other: None. IMPRESSION: No acute fracture or traumatic subluxation of the cervical spine. Electronically Signed   By: Ronney Asters M.D.   On: 07/26/2022 23:04   DG Chest Portable 1 View  Result Date: 07/26/2022 CLINICAL DATA:  Mechanical fall EXAM: PORTABLE CHEST 1 VIEW COMPARISON:  05/01/2021 FINDINGS: Stable cardiomediastinal silhouette. Aortic atherosclerotic calcification. Sternotomy. AVR. Left chest wall pacemaker. Focal airspace opacity in the right lower lung. No pleural effusion or pneumothorax. No displaced rib fractures. Elevated right hemidiaphragm. IMPRESSION: 1. Focal airspace opacity in the right lower lung, favor atelectasis though pneumonia is not excluded. Follow-up in 4-6 weeks is recommended to ensure resolution. 2. No displaced rib fractures. Electronically Signed   By: Placido Sou M.D.   On: 07/26/2022 22:17   DG Hip Unilat W or Wo Pelvis 2-3 Views Right  Result Date: 07/26/2022 CLINICAL DATA:  Fall, felt a pop in hip. Shortening and rotation of right lower extremity. EXAM: DG HIP (WITH OR WITHOUT PELVIS) 2-3V RIGHT COMPARISON:  None Available. FINDINGS: Mildly displaced comminuted intertrochanteric fracture of the right proximal femur. Fracture involves the lesser and base of the greater trochanter. Mild apex lateral angulation. Femoral head remains seated in the acetabulum, there is mild bilateral hip joint space narrowing. Intact pubic  rami. No pubic symphysis or sacroiliac joint diastasis. IMPRESSION: Mildly displaced comminuted intertrochanteric fracture of the right  proximal femur. Electronically Signed   By: Keith Rake M.D.   On: 07/26/2022 22:15      IMPRESSION AND PLAN:  Assessment and Plan: * Closed right hip fracture [facility]) - The patient will be admitted to a medical-surgical bed. - Pain management will be provided. - Orthopedic consultation will be obtained. - Dr. Mack Guise was notified about the patient. - He will be kept NPO. - She has no history of coronary artery disease, CHF, CVA, renal failure with a creatinine of more than 2.  She has a history of diabetes mellitus but is not on insulin.  She is considered at average risk for her age for perioperative cardiovascular events.  No current pulmonary issues.  Hypothyroidism - We will continue Synthroid and check TSH level.    DVT prophylaxis: SCDs for now.  Medical prophylaxis is postponed to postoperative period. Advanced Care Planning:  Code Status: full code. Family Communication:  The plan of care was discussed in details with the patient (and family). I answered all questions. The patient agreed to proceed with the above mentioned plan. Further management will depend upon hospital course. Disposition Plan: Back to previous home environment Consults called: Orthopedic consult All the records are reviewed and case discussed with ED provider.  Status is: Inpatient  At the time of the admission, it appears that the appropriate admission status for this patient is inpatient.  This is judged to be reasonable and necessary in order to provide the required intensity of service to ensure the patient's safety given the presenting symptoms, physical exam findings and initial radiographic and laboratory data in the context of comorbid conditions.  The patient requires inpatient status due to high intensity of service, high risk of further deterioration and high frequency of surveillance required.  I certify that at the time of admission, it is my clinical judgment that the patient will  require inpatient hospital care extending more than 2 midnights.                            Dispo: The patient is from: Home              Anticipated d/c is to: Home              Patient currently is not medically stable to d/c.              Difficult to place patient: No  Christel Mormon M.D on 07/26/2022 at 11:10 PM  [facility]   From 7 PM-7 AM, contact night-coverage [email or web address]  CC: Primary care physician; Pcp, No

## 2022-07-26 NOTE — ED Notes (Signed)
[employer] called about pacemaker.  No arrhythmic abnormalities in the last 48 hours.  Last abnormality was a run of SVT on 07/14/22.  They will fax full report.

## 2022-07-27 ENCOUNTER — Inpatient Hospital Stay: Payer: Medicare Other

## 2022-07-27 ENCOUNTER — Other Ambulatory Visit: Payer: Self-pay

## 2022-07-27 ENCOUNTER — Encounter
Admission: EM | Disposition: A | Discharge status: Skilled Nursing Facility | Payer: Self-pay | Source: Home / Self Care | Attending: Internal Medicine

## 2022-07-27 ENCOUNTER — Inpatient Hospital Stay: Payer: Medicare Other | Admitting: General Practice

## 2022-07-27 DIAGNOSIS — E119 Type 2 diabetes mellitus without complications: Secondary | ICD-10-CM

## 2022-07-27 DIAGNOSIS — I10 Essential (primary) hypertension: Secondary | ICD-10-CM | POA: Diagnosis present

## 2022-07-27 DIAGNOSIS — W19XXXA Unspecified fall, initial encounter: Secondary | ICD-10-CM | POA: Diagnosis not present

## 2022-07-27 DIAGNOSIS — S065XAA Traumatic subdural hemorrhage with loss of consciousness status unknown, initial encounter: Secondary | ICD-10-CM | POA: Diagnosis not present

## 2022-07-27 DIAGNOSIS — F0390 Unspecified dementia without behavioral disturbance: Secondary | ICD-10-CM

## 2022-07-27 HISTORY — PX: INTRAMEDULLARY (IM) NAIL INTERTROCHANTERIC: SHX5875

## 2022-07-27 LAB — GLUCOSE, CAPILLARY
Glucose-Capillary: 133 mg/dL — ABNORMAL HIGH (ref 70–99)
Glucose-Capillary: 139 mg/dL — ABNORMAL HIGH (ref 70–99)
Glucose-Capillary: 140 mg/dL — ABNORMAL HIGH (ref 70–99)
Glucose-Capillary: 142 mg/dL — ABNORMAL HIGH (ref 70–99)
Glucose-Capillary: 142 mg/dL — ABNORMAL HIGH (ref 70–99)
Glucose-Capillary: 162 mg/dL — ABNORMAL HIGH (ref 70–99)
Glucose-Capillary: 189 mg/dL — ABNORMAL HIGH (ref 70–99)
Glucose-Capillary: 201 mg/dL — ABNORMAL HIGH (ref 70–99)

## 2022-07-27 LAB — BASIC METABOLIC PANEL
Anion gap: 7 (ref 5–15)
BUN: 18 mg/dL (ref 8–23)
CO2: 24 mmol/L (ref 22–32)
Calcium: 9.5 mg/dL (ref 8.9–10.3)
Chloride: 103 mmol/L (ref 98–111)
Creatinine, Ser: 0.67 mg/dL (ref 0.44–1.00)
GFR, Estimated: >60 mL/min (ref >60)
Glucose, Bld: 151 mg/dL — ABNORMAL HIGH (ref 70–99)
Potassium: 4.2 mmol/L (ref 3.5–5.1)
Sodium: 134 mmol/L — ABNORMAL LOW (ref 135–145)

## 2022-07-27 LAB — TYPE AND SCREEN
ABO/RH(D): O POS
Antibody Screen: NEGATIVE

## 2022-07-27 LAB — CBC
HCT: 35.5 % — ABNORMAL LOW (ref 36.0–46.0)
Hemoglobin: 11.3 g/dL — ABNORMAL LOW (ref 12.0–15.0)
MCH: 31.7 pg (ref 26.0–34.0)
MCHC: 31.8 g/dL (ref 30.0–36.0)
MCV: 99.7 fL (ref 80.0–100.0)
Platelets: 171 10*3/uL (ref 150–400)
RBC: 3.56 MIL/uL — ABNORMAL LOW (ref 3.87–5.11)
RDW: 14.3 % (ref 11.5–15.5)
WBC: 9.2 10*3/uL (ref 4.0–10.5)
nRBC: 0 % (ref 0.0–0.2)

## 2022-07-27 LAB — MRSA NEXT GEN BY PCR, NASAL: MRSA by PCR Next Gen: DETECTED — AB

## 2022-07-27 LAB — PROTIME-INR
INR: 1.1 (ref 0.8–1.2)
Prothrombin Time: 13.9 seconds (ref 11.4–15.2)

## 2022-07-27 LAB — APTT: aPTT: 29 seconds (ref 24–36)

## 2022-07-27 LAB — TROPONIN I (HIGH SENSITIVITY): Troponin I (High Sensitivity): 6 ng/L (ref <18)

## 2022-07-27 SURGERY — FIXATION, FRACTURE, INTERTROCHANTERIC, WITH INTRAMEDULLARY ROD
Anesthesia: General | Laterality: Right

## 2022-07-27 MED ORDER — ACETAMINOPHEN 650 MG RE SUPP: 650.0000 mg | RECTAL | Status: DC | PRN

## 2022-07-27 MED ORDER — PANTOPRAZOLE SODIUM 40 MG IV SOLR
40.0000 mg | Freq: Every day | INTRAVENOUS | Status: DC
  Administered 2022-07-27 – 2022-07-28 (×3): 40 mg via INTRAVENOUS
  Filled 2022-07-27 (×3): qty 10

## 2022-07-27 MED ORDER — ACETAMINOPHEN 500 MG PO TABS
500.0000 mg | ORAL_TABLET | Freq: Four times a day (QID) | ORAL | Status: AC
  Administered 2022-07-27 – 2022-07-28 (×3): 500 mg via ORAL
  Filled 2022-07-27 (×3): qty 1

## 2022-07-27 MED ORDER — ACETAMINOPHEN 10 MG/ML IV SOLN
INTRAVENOUS | Status: DC | PRN
  Administered 2022-07-27: 1000 mg via INTRAVENOUS

## 2022-07-27 MED ORDER — POLYETHYLENE GLYCOL 3350 17 G PO PACK: 17.0000 g | PACK | Freq: Every day | ORAL | Status: DC | PRN

## 2022-07-27 MED ORDER — EPHEDRINE SULFATE (PRESSORS) 50 MG/ML IJ SOLN
INTRAMUSCULAR | Status: DC | PRN
  Administered 2022-07-27 (×2): 5 mg via INTRAVENOUS

## 2022-07-27 MED ORDER — ENOXAPARIN SODIUM 40 MG/0.4ML IJ SOSY
40.0000 mg | PREFILLED_SYRINGE | INTRAMUSCULAR | Status: DC
  Administered 2022-07-28 – 2022-07-29 (×2): 40 mg via SUBCUTANEOUS
  Filled 2022-07-27 (×2): qty 0.4

## 2022-07-27 MED ORDER — MORPHINE SULFATE (PF) 2 MG/ML IV SOLN: 0.5000 mg | INTRAVENOUS | Status: DC | PRN

## 2022-07-27 MED ORDER — DEXAMETHASONE SODIUM PHOSPHATE 10 MG/ML IJ SOLN
INTRAMUSCULAR | Status: DC | PRN
  Administered 2022-07-27: 10 mg via INTRAVENOUS

## 2022-07-27 MED ORDER — SODIUM CHLORIDE 0.9 % IV SOLN: INTRAVENOUS | Status: DC

## 2022-07-27 MED ORDER — BISACODYL 10 MG RE SUPP
10.0000 mg | Freq: Every day | RECTAL | Status: DC | PRN
  Filled 2022-07-27: qty 1

## 2022-07-27 MED ORDER — CEFAZOLIN SODIUM-DEXTROSE 2-4 GM/100ML-% IV SOLN
2.0000 g | Freq: Four times a day (QID) | INTRAVENOUS | Status: AC
  Administered 2022-07-27 (×2): 2 g via INTRAVENOUS
  Filled 2022-07-27 (×2): qty 100

## 2022-07-27 MED ORDER — MUPIROCIN 2 % EX OINT
1.0000 | TOPICAL_OINTMENT | Freq: Two times a day (BID) | CUTANEOUS | Status: DC
  Administered 2022-07-27 – 2022-07-29 (×5): 1 via NASAL
  Filled 2022-07-27 (×2): qty 22

## 2022-07-27 MED ORDER — ONDANSETRON HCL 4 MG/2ML IJ SOLN
INTRAMUSCULAR | Status: AC
  Filled 2022-07-27: qty 2

## 2022-07-27 MED ORDER — PHENYLEPHRINE HCL (PRESSORS) 10 MG/ML IV SOLN
INTRAVENOUS | Status: DC | PRN
  Administered 2022-07-27 (×3): 80 ug via INTRAVENOUS
  Administered 2022-07-27: 160 ug via INTRAVENOUS
  Administered 2022-07-27: 80 ug via INTRAVENOUS
  Administered 2022-07-27: 160 ug via INTRAVENOUS

## 2022-07-27 MED ORDER — HYDROCODONE-ACETAMINOPHEN 5-325 MG PO TABS
1.0000 | ORAL_TABLET | ORAL | Status: DC | PRN
  Administered 2022-07-29: 2 via ORAL
  Filled 2022-07-27: qty 2

## 2022-07-27 MED ORDER — CEFAZOLIN SODIUM-DEXTROSE 2-4 GM/100ML-% IV SOLN
INTRAVENOUS | Status: AC
  Filled 2022-07-27: qty 100

## 2022-07-27 MED ORDER — FENTANYL CITRATE (PF) 100 MCG/2ML IJ SOLN
INTRAMUSCULAR | Status: AC
  Filled 2022-07-27: qty 2

## 2022-07-27 MED ORDER — SUCCINYLCHOLINE CHLORIDE 200 MG/10ML IV SOSY
PREFILLED_SYRINGE | INTRAVENOUS | Status: DC | PRN
  Administered 2022-07-27: 80 mg via INTRAVENOUS

## 2022-07-27 MED ORDER — TRAMADOL HCL 50 MG PO TABS
50.0000 mg | ORAL_TABLET | Freq: Four times a day (QID) | ORAL | Status: DC
  Administered 2022-07-27 – 2022-07-29 (×8): 50 mg via ORAL
  Filled 2022-07-27 (×8): qty 1

## 2022-07-27 MED ORDER — DEXAMETHASONE SODIUM PHOSPHATE 10 MG/ML IJ SOLN
INTRAMUSCULAR | Status: AC
  Filled 2022-07-27: qty 1

## 2022-07-27 MED ORDER — STROKE: EARLY STAGES OF RECOVERY BOOK: Freq: Once | Status: AC

## 2022-07-27 MED ORDER — KETAMINE HCL 50 MG/ML IJ SOLN
INTRAMUSCULAR | Status: DC | PRN
  Administered 2022-07-27 (×2): 25 mg via INTRAVENOUS

## 2022-07-27 MED ORDER — ONDANSETRON HCL 4 MG/2ML IJ SOLN
4.0000 mg | Freq: Four times a day (QID) | INTRAMUSCULAR | Status: DC | PRN
  Filled 2022-07-27: qty 2

## 2022-07-27 MED ORDER — INSULIN ASPART 100 UNIT/ML IJ SOLN
0.0000 [IU] | INTRAMUSCULAR | Status: DC
  Administered 2022-07-27: 1 [IU] via SUBCUTANEOUS
  Administered 2022-07-27: 3 [IU] via SUBCUTANEOUS
  Administered 2022-07-27 (×2): 1 [IU] via SUBCUTANEOUS
  Administered 2022-07-27: 2 [IU] via SUBCUTANEOUS
  Administered 2022-07-27: 3 [IU] via SUBCUTANEOUS
  Administered 2022-07-27 – 2022-07-28 (×2): 1 [IU] via SUBCUTANEOUS
  Filled 2022-07-27 (×8): qty 1

## 2022-07-27 MED ORDER — FENTANYL CITRATE (PF) 100 MCG/2ML IJ SOLN: 25.0000 ug | INTRAMUSCULAR | Status: DC | PRN

## 2022-07-27 MED ORDER — SUCCINYLCHOLINE CHLORIDE 200 MG/10ML IV SOSY
PREFILLED_SYRINGE | INTRAVENOUS | Status: AC
  Filled 2022-07-27: qty 10

## 2022-07-27 MED ORDER — ACETAMINOPHEN 10 MG/ML IV SOLN
INTRAVENOUS | Status: AC
  Filled 2022-07-27: qty 100

## 2022-07-27 MED ORDER — METHOCARBAMOL 500 MG PO TABS: 500.0000 mg | ORAL_TABLET | Freq: Four times a day (QID) | ORAL | Status: DC | PRN

## 2022-07-27 MED ORDER — 0.9 % SODIUM CHLORIDE (POUR BTL) OPTIME
TOPICAL | Status: DC | PRN
  Administered 2022-07-27: 1000 mL

## 2022-07-27 MED ORDER — ACETAMINOPHEN 160 MG/5ML PO SOLN: 650.0000 mg | ORAL | Status: DC | PRN

## 2022-07-27 MED ORDER — AMLODIPINE BESYLATE 5 MG PO TABS
2.5000 mg | ORAL_TABLET | Freq: Every day | ORAL | Status: DC
  Administered 2022-07-28 – 2022-07-29 (×2): 2.5 mg via ORAL
  Filled 2022-07-27 (×3): qty 1

## 2022-07-27 MED ORDER — DOCUSATE SODIUM 100 MG PO CAPS
100.0000 mg | ORAL_CAPSULE | Freq: Two times a day (BID) | ORAL | Status: DC
  Administered 2022-07-27 – 2022-07-29 (×4): 100 mg via ORAL
  Filled 2022-07-27 (×4): qty 1

## 2022-07-27 MED ORDER — KETAMINE HCL 50 MG/5ML IJ SOSY
PREFILLED_SYRINGE | INTRAMUSCULAR | Status: AC
  Filled 2022-07-27: qty 5

## 2022-07-27 MED ORDER — METHOCARBAMOL 1000 MG/10ML IJ SOLN: 500.0000 mg | Freq: Four times a day (QID) | INTRAVENOUS | Status: DC | PRN

## 2022-07-27 MED ORDER — EPHEDRINE 5 MG/ML INJ
INTRAVENOUS | Status: AC
  Filled 2022-07-27: qty 5

## 2022-07-27 MED ORDER — ACETAMINOPHEN 325 MG PO TABS: 650.0000 mg | ORAL_TABLET | ORAL | Status: DC | PRN

## 2022-07-27 MED ORDER — ALUM & MAG HYDROXIDE-SIMETH 200-200-20 MG/5ML PO SUSP: 30.0000 mL | ORAL | Status: DC | PRN

## 2022-07-27 MED ORDER — LIDOCAINE HCL (CARDIAC) PF 100 MG/5ML IV SOSY
PREFILLED_SYRINGE | INTRAVENOUS | Status: DC | PRN
  Administered 2022-07-27: 50 mg via INTRAVENOUS

## 2022-07-27 MED ORDER — PROPOFOL 10 MG/ML IV BOLUS
INTRAVENOUS | Status: DC | PRN
  Administered 2022-07-27: 20 mg via INTRAVENOUS
  Administered 2022-07-27: 80 mg via INTRAVENOUS

## 2022-07-27 MED ORDER — CEFAZOLIN SODIUM-DEXTROSE 2-4 GM/100ML-% IV SOLN
2.0000 g | INTRAVENOUS | Status: AC
  Administered 2022-07-27: 2 g via INTRAVENOUS
  Filled 2022-07-27: qty 100

## 2022-07-27 MED ORDER — ONDANSETRON HCL 4 MG PO TABS: 4.0000 mg | ORAL_TABLET | Freq: Four times a day (QID) | ORAL | Status: DC | PRN

## 2022-07-27 MED ORDER — OXYCODONE HCL 5 MG PO TABS: 5.0000 mg | ORAL_TABLET | Freq: Once | ORAL | Status: DC | PRN

## 2022-07-27 MED ORDER — SENNOSIDES-DOCUSATE SODIUM 8.6-50 MG PO TABS
1.0000 | ORAL_TABLET | Freq: Two times a day (BID) | ORAL | Status: DC
  Administered 2022-07-27 – 2022-07-29 (×5): 1 via ORAL
  Filled 2022-07-27 (×5): qty 1

## 2022-07-27 MED ORDER — OXYCODONE HCL 5 MG/5ML PO SOLN: 5.0000 mg | Freq: Once | ORAL | Status: DC | PRN

## 2022-07-27 MED ORDER — FENTANYL CITRATE (PF) 100 MCG/2ML IJ SOLN
INTRAMUSCULAR | Status: DC | PRN
  Administered 2022-07-27 (×2): 50 ug via INTRAVENOUS

## 2022-07-27 MED ORDER — LIDOCAINE HCL (PF) 2 % IJ SOLN
INTRAMUSCULAR | Status: AC
  Filled 2022-07-27: qty 5

## 2022-07-27 MED ORDER — ONDANSETRON HCL 4 MG/2ML IJ SOLN
INTRAMUSCULAR | Status: DC | PRN
  Administered 2022-07-27: 4 mg via INTRAVENOUS

## 2022-07-27 MED ORDER — LABETALOL HCL 5 MG/ML IV SOLN: 20.0000 mg | INTRAVENOUS | Status: DC | PRN

## 2022-07-27 SURGICAL SUPPLY — 42 items
BIT DRILL 4.3MMS DISTAL GRDTED (BIT) IMPLANT
BNDG COHESIVE 6X5 TAN ST LF (GAUZE/BANDAGES/DRESSINGS) ×2 IMPLANT
CORTICAL BONE SCR 5.0MM X 46MM (Screw) ×1 IMPLANT
DRAPE 3/4 80X56 (DRAPES) ×2 IMPLANT
DRAPE SURG 17X11 SM STRL (DRAPES) ×2 IMPLANT
DRAPE U-SHAPE 47X51 STRL (DRAPES) ×2 IMPLANT
DRILL 4.3MMS DISTAL GRADUATED (BIT) ×1
DRSG OPSITE POSTOP 4X6 (GAUZE/BANDAGES/DRESSINGS) ×1 IMPLANT
DRSG TEGADERM 4X4.75 (GAUZE/BANDAGES/DRESSINGS) IMPLANT
DURAPREP 26ML APPLICATOR (WOUND CARE) ×2 IMPLANT
ELECT REM PT RETURN 9FT ADLT (ELECTROSURGICAL) ×1
ELECTRODE REM PT RTRN 9FT ADLT (ELECTROSURGICAL) ×1 IMPLANT
GLOVE BIOGEL PI IND STRL 9 (GLOVE) ×1 IMPLANT
GLOVE BIOGEL PI ORTHO SZ9 (GLOVE) ×4 IMPLANT
GOWN STRL REUS TWL 2XL XL LVL4 (GOWN DISPOSABLE) ×1 IMPLANT
GOWN STRL REUS W/ TWL LRG LVL3 (GOWN DISPOSABLE) ×1 IMPLANT
GOWN STRL REUS W/TWL LRG LVL3 (GOWN DISPOSABLE) ×1
GUIDEPIN VERSANAIL DSP 3.2X444 (ORTHOPEDIC DISPOSABLE SUPPLIES) IMPLANT
GUIDEWIRE BALL NOSE 100CM (WIRE) IMPLANT
HEMOVAC 400CC 10FR (MISCELLANEOUS) IMPLANT
HIP FR NAIL LAG SCREW 10.5X110 (Orthopedic Implant) ×1 IMPLANT
KIT TURNOVER CYSTO (KITS) ×1 IMPLANT
MANIFOLD NEPTUNE II (INSTRUMENTS) ×1 IMPLANT
MAT ABSORB  FLUID 56X50 GRAY (MISCELLANEOUS) ×1
MAT ABSORB FLUID 56X50 GRAY (MISCELLANEOUS) ×1 IMPLANT
NAIL HIP FRACTURE 11X380MM (Nail) IMPLANT
NS IRRIG 500ML POUR BTL (IV SOLUTION) ×1 IMPLANT
PACK HIP COMPR (MISCELLANEOUS) ×1 IMPLANT
PAD ARMBOARD 7.5X6 YLW CONV (MISCELLANEOUS) ×1 IMPLANT
SCREW BONE CORTICAL 5.0X42 (Screw) IMPLANT
SCREW CORTICL BON 5.0MM X 46MM (Screw) IMPLANT
SCREW LAG HIP FR NAIL 10.5X110 (Orthopedic Implant) IMPLANT
STAPLER SKIN PROX 35W (STAPLE) ×1 IMPLANT
SUCTION FRAZIER HANDLE 10FR (MISCELLANEOUS) ×1
SUCTION TUBE FRAZIER 10FR DISP (MISCELLANEOUS) ×1 IMPLANT
SUT VIC AB 0 CT1 36 (SUTURE) ×2 IMPLANT
SUT VIC AB 2-0 CT1 27 (SUTURE) ×1
SUT VIC AB 2-0 CT1 TAPERPNT 27 (SUTURE) ×1 IMPLANT
SUT VICRYL 0 UR6 27IN ABS (SUTURE) ×1 IMPLANT
SYR 30ML LL (SYRINGE) ×1 IMPLANT
TRAP FLUID SMOKE EVACUATOR (MISCELLANEOUS) ×1 IMPLANT
WATER STERILE IRR 500ML POUR (IV SOLUTION) ×1 IMPLANT

## 2022-07-27 NOTE — Transfer of Care (Signed)
Immediate Anesthesia Transfer of Care Note  Patient: Zoe [name]  Procedure(s) Performed: INTRAMEDULLARY (IM) NAIL INTERTROCHANTERIC (Right)  Patient Location: PACU  Anesthesia Type:General  Level of Consciousness: sedated  Airway & Oxygen Therapy: Patient Spontanous Breathing and Patient connected to face mask oxygen  Post-op Assessment: Report given to RN and Post -op Vital signs reviewed and stable  Post vital signs: Reviewed  Last Vitals:  Vitals Value Taken Time  BP    Temp    Pulse    Resp    SpO2      Last Pain:  Vitals:   07/27/22 0726  TempSrc: Oral  PainSc:          Complications: No notable events documented.

## 2022-07-27 NOTE — Anesthesia Postprocedure Evaluation (Signed)
Anesthesia Post Note  Patient: Zoe [name]  Procedure(s) Performed: INTRAMEDULLARY (IM) NAIL INTERTROCHANTERIC (Right)  Patient location during evaluation: PACU Anesthesia Type: General Level of consciousness: awake and alert Pain management: pain level controlled Vital Signs Assessment: post-procedure vital signs reviewed and stable Respiratory status: spontaneous breathing, nonlabored ventilation, respiratory function stable and patient connected to nasal cannula oxygen Cardiovascular status: blood pressure returned to baseline and stable Postop Assessment: no apparent nausea or vomiting Anesthetic complications: no  No notable events documented.   Last Vitals:  Vitals:   07/27/22 1728 07/27/22 2024  BP: (!) 143/74 112/65  Pulse: 79 82  Resp: 16 16  Temp: 36.9 C 36.4 C  SpO2: 100% 96%    Last Pain:  Vitals:   07/27/22 2030  TempSrc:   PainSc: 0-No pain                 Dimas Millin

## 2022-07-27 NOTE — Hospital Course (Signed)
Zoe [name] is a 89 y.o. female with medical history significant for type 2 diabetes mellitus, hypertension, hypothyroidism dementia and osteoarthritis and pacemaker placement, who presented to the ER on 07/26/2022 for evaluation after having a fall.  Patient reportedly lives with a family member and reports feeling a pop in her right hip that then caused her to fall down.  She did not have any head injury but admitted to hitting her head on the door.  She was having right lower extremity pain.  No headache or dizziness or blurred vision.  She denied any nausea or vomiting then vomited once in the emergency room  In the ED, patient was round to have a mildly displaced comminuted intertrochanteric right proximal femur fracture on x-ray of Right hip. CT head showed a mixed density left subdural hematoma has both acute and chronic components.  measures up to 17 mm in thickness. There is underlying mass effect on the subjacent brain parenchyma. No significant midline shift C-spine CT showed no acute fracture or traumatic subluxation. Chest xray showed a RLL focal opacity felt to be atelectasis, pna not excluded and follow up xray in 4-6 weeks recommended to ensure resolution.   Admitted to medicine service in stepdown unit for close neurological monitoring.  Neurosurgery was consulted.  Head CT was repeated and subdural hematoma was stable.  Orthopedic surgery consulted and plan for surgical repair of the hip fracture.  3/9 -- stable neurologically, transferred to medical floor.   3/10 -- stable 3/11 -- remains stable for discharge to rehab today.

## 2022-07-27 NOTE — Progress Notes (Signed)
I marked the patient's right hip in the PACU according hospital's correct site of surgery protocol.  I was able to reach the patient's son by phone and reviewed the details of the operation as well as the postoperative course with him.  We discussed the risks and benefits of surgery and he was in agreement with the surgical plan.  Consent obtained by one of the nurses and for the patient.

## 2022-07-27 NOTE — Consult Note (Signed)
Consult requested by:  Dr. Sidney Ace  Consult requested for:  Subdural hematoma  Primary Physician:  Pcp, No  History of Present Illness: 07/27/2022 Ms. Korayma Matsushita is here today with a chief complaint of fall causing a broken hip.  She is unable to ambulate which prompted emergency department evaluation.  She does have baseline dementia.  She was able to tell the emergency department yesterday that she did hit her head when she fell.  She denies headache, nausea, or vomiting.  She does report to me that she lives at home with her son.  She reports that she does most of her daily activities at baseline.  She is unable to move her left lower extremity proximally.  I have utilized the care everywhere function in epic to review the outside records available from external health systems.  Review of Systems:  A 10 point review of systems is negative, except for the pertinent positives and negatives detailed in the HPI.  Past Medical History: Past Medical History:  Diagnosis Date   Arthritis    Dementia ([place])    Diabetes mellitus without complication ([place])     Past Surgical History: Past Surgical History:  Procedure Laterality Date   CARDIAC SURGERY  2015   OPEN REDUCTION INTERNAL FIXATION (ORIF) TIBIA/FIBULA FRACTURE Right 05/29/2019   Procedure: OPEN REDUCTION INTERNAL FIXATION (ORIF) TIBIA/FIBULA FRACTURE;  Surgeon: Corky Mull, MD;  Location: [facility] ORS;  Service: Orthopedics;  Laterality: Right;    Allergies: Allergies as of 07/26/2022   (No Known Allergies)    Medications: No outpatient medications have been marked as taking for the 07/26/22 encounter [facility] Encounter).    Social History: Social History   Tobacco Use   Smoking status: Former    Packs/day: 0.25    Years: 5.00    Total pack years: 1.25    Types: Cigarettes    Quit date: 05/22/1968    Years since quitting: 54.2   Smokeless tobacco: Never  Substance Use Topics   Alcohol use: Never   Drug use:  Never    Family Medical History: History reviewed. No pertinent family history.  Physical Examination: Vitals:   07/27/22 0726 07/27/22 0800  BP:  122/70  Pulse:  81  Resp:  16  Temp: 98.1 F (36.7 C)   SpO2:  97%    General: Patient is well developed, well nourished, calm, collected, and in no apparent distress. Attention to examination is appropriate.  Neck:   Supple.  Full range of motion.  Respiratory: Patient is breathing without any difficulty.   NEUROLOGICAL:     Awake, alert, oriented to person, place, and 2024 with choices.  Speech is clear and fluent.  Cranial Nerves: Pupils equal round and reactive to light.  Facial tone is symmetric.  Facial sensation is symmetric. Shoulder shrug is symmetric. Tongue protrusion is midline.  There is no pronator drift.    Strength: Side Biceps Triceps Deltoid Interossei Grip Wrist Ext. Wrist Flex.  R '5 5 5 5 5 5 5  '$ L '5 5 5 5 5 5 5   '$ Side Iliopsoas Quads Hamstring PF DF EHL  R - - - '5 5 5  '$ L '5 5 5 5 5 5   '$ Reflexes are 1+ and symmetric at the biceps, triceps, brachioradialis, patella and achilles.   Hoffman's is absent.   Bilateral upper and lower extremity sensation is intact to light touch.    No evidence of dysmetria noted.  Gait is untested.  Medical Decision Making  Imaging: CT Head 07/27/2022 IMPRESSION: 1. Stable mixed density and mildly loculated appearing left subdural hematoma, up to 17 mm thick. Some mass effect on the left hemisphere, but no significant midline shift. 2. No new intracranial abnormality.     Electronically Signed   By: Genevie Ann M.D.   On: 07/27/2022 05:15  I have personally reviewed the images and agree with the above interpretation.  Assessment and Plan: Ms. Roudabush is a pleasant 89 y.o. female with mixed density left-sided subdural hematoma.  This is likely traumatic in nature. She has significant cortical atrophy and has minimal left right midline shift due to this.  The vast  majority of this hematoma is chronic with a small acute component that has settled on her repeat scan.  Overall, her condition is stable.  She appears to be at or very near her baseline neurological status.  Based on the stability of his CT scan, I think she is stable and safe to proceed with surgical intervention on her right hip.    At this point, she is very close to her baseline mental status.  She has an underlying diagnosis of dementia and is in her 89s.  As such, I would have quite a high threshold to consider surgical intervention.  That being said, I remain available for bur hole drainage should that become necessary.  - No surgical intervention for burr hole drainage for now - OK to proceed with hip surgery - Will continue to follow - Would hold on any AEDs given advanced age and risk for polypharmacy - OK to start DVT prophylaxis tomorrow AM     I have communicated my recommendations to the requesting physician and coordinated care to facilitate these recommendations.     Felicity Penix K. Izora Ribas MD, [facility]

## 2022-07-27 NOTE — Plan of Care (Signed)
  Problem: Education: Goal: Knowledge of General Education information will improve Description: Including pain rating scale, medication(s)/side effects and non-pharmacologic comfort measures Outcome: Not Progressing   Problem: Health Behavior/Discharge Planning: Goal: Ability to manage health-related needs will improve Outcome: Progressing   Problem: Elimination: Goal: Will not experience complications related to bowel motility Outcome: Progressing   Problem: Pain Managment: Goal: General experience of comfort will improve Outcome: Progressing  Unable to complete patient profile due to confusion.

## 2022-07-27 NOTE — Anesthesia Procedure Notes (Signed)
Procedure Name: Intubation Date/Time: 07/27/2022 2:56 PM  Performed by: Rolla Plate, CRNAPre-anesthesia Checklist: Patient identified, Patient being monitored, Timeout performed, Emergency Drugs available and Suction available Patient Re-evaluated:Patient Re-evaluated prior to induction Oxygen Delivery Method: Circle system utilized Preoxygenation: Pre-oxygenation with 100% oxygen Induction Type: IV induction Ventilation: Mask ventilation without difficulty Laryngoscope Size: 3 and McGraph Grade View: Grade I Tube type: Oral Tube size: 7.0 mm Number of attempts: 1 Airway Equipment and Method: Stylet and Video-laryngoscopy Placement Confirmation: ETT inserted through vocal cords under direct vision, positive ETCO2 and breath sounds checked- equal and bilateral Secured at: 21 cm Tube secured with: Tape Dental Injury: Teeth and Oropharynx as per pre-operative assessment

## 2022-07-27 NOTE — Assessment & Plan Note (Addendum)
Continue home low dose Norvasc  PRN IV labetalol while NPO

## 2022-07-27 NOTE — Assessment & Plan Note (Addendum)
No current behavioral changes. Appears no longer taking Aricept. Delirium precautions

## 2022-07-27 NOTE — Progress Notes (Signed)
SLP Cancellation Note  Patient Details Name: Zoe [name] MRN: [id number] DOB: [birth year]   Cancelled treatment:       Reason Eval/Treat Not Completed: Patient not medically ready. Note baseline history of dementia. Spoke with RN; hip surgery planned today. Will defer cognitive assessment at this time. Recommend monitoring for difficulties swallowing post-procedure and please place SLP swallow orders if concerns for dysphagia.  Deneise Lever, [place], Actor Office: [phone] ASCOM: [place] 07/27/2022, 10:18 AM

## 2022-07-27 NOTE — Progress Notes (Signed)
PT Cancellation Note  Patient Details Name: Zoe [name] MRN: [id number] DOB: 07-04-1926   Cancelled Treatment:    Reason Eval/Treat Not Completed: Patient not medically ready (Chart reviewed. Ortho consult still pending, no weightbearign status ordered, per RN likely to OR today for ORIF. Please place new orders once pt is ready to start physical therapy. Will signoff at this time.)  10:29 AM, 07/27/22 [name], PT, DPT Physical Therapist - [facility]  [phone] ([place])     [name] 07/27/2022, 10:28 AM

## 2022-07-27 NOTE — Assessment & Plan Note (Addendum)
Neurosurgery consulted. Repeat head CT at 6 hours was stable  --No neurosurgical intervention at this time --Cleared for hip surgery from neuro standpoint --Okay to  start DVT ppx post-op --If therapeutic dose anticoagulation were to become indicated, reach back out to neurosurgery --No anti-seizure meds recommended due to advanced and and risk of polypharmacy and adverse side effects

## 2022-07-27 NOTE — Op Note (Addendum)
07/27/2022  4:39 PM  PATIENT:  Alfredo Martinez Pals    PRE-OPERATIVE DIAGNOSIS:  Right intertrochanteric hip fracture  POST-OPERATIVE DIAGNOSIS:  Same  PROCEDURE: Intramedullary fixation for displaced comminuted right intertrochanteric hip fracture  SURGEON:  Thornton Park, MD  ANESTHESIA:   General  EBL: 50 cc  IMPLANT:  ZIMMER BIOMET AFFIXUS NAIL 11 mm x 380 mm with a one-time mm lag screw and distal interlocking screws 42 mm  and 46 mm in length.  PREOPERATIVE INDICATIONS:  ROXY WAIGHT is a  89 y.o. female with a diagnosis of Right intertrochanteric hip fracture.  Given the displacement of the fracture patient was recommended for intramedullary fixation.  The risks, benefits and alternatives were discussed with the patient and her son.  The risks include but are not limited to infection, bleeding requiring blood transfusion, nerve or blood vessel injury, malunion, nonunion, hardware prominence, hardware failure, leg length discrepancy or change in lower extremity rotation and need for further surgery including hardware removal with conversion to a total hip arthroplasty. The patient and their son understood these risks and wished to proceed with surgery.  OPERATIVE PROCEDURE:  The patient was brought to the operating room and placed in the supine position on the fracture table. The patient received general anesthesia.  A closed reduction was performed under C-arm guidance.  The fracture reduction was confirmed on both AP and lateral views. After adequate reduction was achieved, a time out was performed to verify the patient's name, date of birth, medical record number, correct site of surgery correct procedure to be performed. The timeout was also used to verify the patient received antibiotics and all appropriate instruments, implants and radiographic studies were available in the room. Once all in attendance were in agreement, the case began. The patient was prepped and draped in a  sterile fashion.  The patient received preoperative antibiotics with 2 g of Ancef IV.  An incision was made proximal to the greater trochanter in line with the femur. A guidewire was placed over the tip of the greater trochanter and advanced by drill into the proximal femur to the level of the lesser trochanter.  Confirmation of the drill pin position was made on AP and lateral C-arm images.  The threaded guidepin was then overdrilled with the proximal femoral entry reamer.  A ball-tipped guidewire was then advanced down the intramedullary canal, across the fracture, and down the femoral shaft to the knee.  The ball tip guidewire's position was confirmed at both the knee and hip via C-arm imaging. A depth gauge was used to measure the length of the long nail to be used. It was measured to be 380 mm. The actual nail was then inserted into the proximal femur, across the fracture site and down the femoral shaft. Its position was confirmed on AP and lateral C-arm images.  The ball tip guidewire was removed.  Once the nail was completely seated, the drill guide for the lag screw was placed through the guide arm for the Affixus nail. A guidepin was then placed through this drill guide and advanced through the lateral cortex of the femur, across the fracture site and into the femoral head achieving a tip apex distance of less than 25 mm. The length of the drill pin was measured to be 110 mm, and then the drill for the lag screw was advanced through the lateral cortex, across the fracture site and up into the femoral head to the depth of the lag screw. The lag  screw was then advanced by hand into position across the fracture site into the femoral head. Its final position was confirmed on AP and lateral C-arm images. Compression was applied as traction was carefully released. The set screw in the top of the intramedullary rod was tightened by hand using a screwdriver. It was backed off a quarter turn to allow for  compression at the fracture site.  The attention was then turned to placement of the distal interlocking screws. A perfect circle technique was used. 2 small stab incisions were made over the distal interlocking screw holes.  A free hand technique was used to drill both distal interlocking screws. The depth of the screw holes was measured with a depth gauge. The 42 mm and 46 mm screws were then advanced into position and tightened by hand. Final C-arm images of the entire intramedullary construct were taken in both the AP and lateral planes.   The wounds were irrigated copiously and closed with 0 Vicryl for closure of the deep fascia and 2-0 Vicryl for subcutaneous closure. The skin was approximated with staples. A dry sterile dressing was applied. I was scrubbed and present the entire case and all sharp, sponge and instrument counts were correct at the conclusion of the case. Patient was transferred to a hospital bed and brought to PACU in stable condition.  I attempted several times to call her son postoperatively to let him know that the patient did well with surgery but there was no answer or ability to leave a message.    Timoteo Gaul, MD

## 2022-07-27 NOTE — Anesthesia Preprocedure Evaluation (Signed)
Anesthesia Evaluation  Patient identified by MRN, date of birth, ID band Patient confused    Reviewed: Allergy & Precautions, H&P , NPO status , Patient's Chart, lab work & pertinent test results  Airway Mallampati: III  TM Distance: >3 FB Neck ROM: limited    Dental  (+) Upper Dentures, Missing, Chipped, Dental Advidsory Given   Pulmonary neg pulmonary ROS, former smoker   Pulmonary exam normal        Cardiovascular hypertension, Normal cardiovascular exam+ dysrhythmias (CHB s/p pacemaker placement) + pacemaker + Valvular Problems/Murmurs (s/p AVR) AS   09/2018 ECHO INTERPRETATION NORMAL LEFT VENTRICULAR SYSTOLIC FUNCTION  WITH MODERATE LVH NORMAL RIGHT VENTRICULAR SYSTOLIC FUNCTION MODERATE VALVULAR REGURGITATION (See above) NO VALVULAR STENOSIS Leaflets: BIOPROSTHETIC Closest EF: >55% (Estimated) LVH: MODERATE LVH AVS: BIOPROSTHETIC AoV Mitral: MODERATE MR Tricuspid: MODERATE TR   Neuro/Psych  PSYCHIATRIC DISORDERS     Dementia negative neurological ROS     GI/Hepatic negative GI ROS, Neg liver ROS,neg GERD  ,,  Endo/Other  diabetesHypothyroidism    Renal/GU      Musculoskeletal  (+) Arthritis ,    Abdominal   Peds  Hematology negative hematology ROS (+)   Anesthesia Other Findings Past Medical History: No date: Arthritis No date: Diabetes mellitus without complication ([place])  Past Surgical History: 2015: CARDIAC SURGERY, S/P Bio AVR   BMI    Body Mass Index: 29.04 kg/m      Reproductive/Obstetrics negative OB ROS                             Anesthesia Physical Anesthesia Plan  ASA: III and emergent  Anesthesia Plan: General ETT   Post-op Pain Management:    Induction: Intravenous  PONV Risk Score and Plan: 3 and Treatment may vary due to age or medical condition, Ondansetron and Dexamethasone  Airway Management Planned: Oral ETT  Additional Equipment:    Intra-op Plan:   Post-operative Plan: Extubation in OR  Informed Consent: I have reviewed the patients History and Physical, chart, labs and discussed the procedure including the risks, benefits and alternatives for the proposed anesthesia with the patient or authorized representative who has indicated his/her understanding and acceptance.   Patient has DNR.  Discussed DNR with power of attorney and Suspend DNR.   Dental Advisory Given  Plan Discussed with: Anesthesiologist, CRNA and Surgeon  Anesthesia Plan Comments: (Patient's son consented for risks of anesthesia including but not limited to:  - adverse reactions to medications - damage to eyes, teeth, lips or other oral mucosa - nerve damage due to positioning  - sore throat or hoarseness - Damage to heart, brain, nerves, lungs, other parts of body or loss of life  Patient's son Marcello Moores voiced understanding.)        Anesthesia Quick Evaluation

## 2022-07-27 NOTE — Progress Notes (Signed)
Progress Note   Patient: Zoe [name] [facility] [id number] DOB: 07-Mar-1927 DOA: 07/26/2022     1 DOS: the patient was seen and examined on 07/27/2022   Brief hospital course: Zoe [name] is a 89 y.o. female with medical history significant for type 2 diabetes mellitus, hypertension, hypothyroidism dementia and osteoarthritis and pacemaker placement, who presented to the ER on 07/26/2022 for evaluation after having a fall.  Patient reportedly lives with a family member and reports feeling a pop in her right hip that then caused her to fall down.  She did not have any head injury but admitted to hitting her head on the door.  She was having right lower extremity pain.  No headache or dizziness or blurred vision.  She denied any nausea or vomiting then vomited once in the emergency room  In the ED, patient was round to have a mildly displaced comminuted intertrochanteric right proximal femur fracture on x-ray of Right hip. CT head showed a mixed density left subdural hematoma has both acute and chronic components.  measures up to 17 mm in thickness. There is underlying mass effect on the subjacent brain parenchyma. No significant midline shift C-spine CT showed no acute fracture or traumatic subluxation. Chest xray showed a RLL focal opacity felt to be atelectasis, pna not excluded and follow up xray in 4-6 weeks recommended to ensure resolution.   Admitted to medicine service in stepdown unit for close neurological monitoring.  Neurosurgery was consulted.  Head CT was repeated and subdural hematoma was stable.  Orthopedic surgery consulted and plan for surgical repair of the hip fracture.  3/9 -- stable neurologically, transferred to medical floor.      Assessment and Plan: * Closed right hip fracture ([name]) Ortho consulted -- taking to OR later today NPO Post-op PT evaluation Pain control PRN Bowel regimen DVT prophylaxis post-op per ortho  Subdural hematoma [facility]) Neurosurgery  consulted. Repeat head CT at 6 hours was stable  --No neurosurgical intervention at this time --Cleared for hip surgery from neuro standpoint --Okay to  start DVT ppx tomorrow --No anti-seizure meds recommended due to advanced and and risk of polypharmacy and adverse side effects --Neuro checks q4h  Essential hypertension Continue home low dose Norvasc  PRN IV labetalol while NPO  Hypothyroidism Continue Synthroid   Controlled type 2 diabetes mellitus without complication, without long-term current use of insulin (HCC) On metformin and Actos in the past. Cover with sliding scale Novolog during admission Adjust for CBG goal 140-180  Dementia without behavioral disturbance (HCC) No current behavioral changes. Appears no longer taking Aricept. Delirium precautions        Subjective: Pt was awake resting in bed partially on her right side.  She was asking to sit up, having back pain.  Does not complain of much hip pain.  She denies other complaints, repeatedly states she needs to get up and move.   Physical Exam: Vitals:   07/27/22 0700 07/27/22 0726 07/27/22 0800 07/27/22 0855  BP: 139/70  122/70 (!) 150/75  Pulse: 80  81 80  Resp: '18  16 15  '$ Temp:  98.1 F (36.7 C)  98.2 F (36.8 C)  TempSrc:  Oral    SpO2: 94%  97% 100%  Weight:      Height:       General exam: awake, alert, no acute distress HEENT: atraumatic, clear conjunctiva, anicteric sclera, moist mucus membranes, hearing grossly normal  Respiratory system: CTAB, no wheezes, rales or rhonchi, normal respiratory effort. Cardiovascular  system: normal S1/S2, RRR, no JVD, murmurs, rubs, gallops, no pedal edema.   Gastrointestinal system: soft, NT, ND, no HSM felt, +bowel sounds. Central nervous system: A&O x self and hospital. no gross focal neurologic deficits, normal speech Extremities: right lower extremity shortened and externally rotated, right > left LE edema Psychiatry: anxious mood, congruent affect,  abnormal judgement and insight due to dementia   Data Reviewed:  Notable labs ---- Na 134, glucose 151, Hbg 11.3   Family Communication: None present, will attempt to call  Disposition: Status is: Inpatient Remains inpatient appropriate because: surgery today for hip fracture, monitoring neuro status.  PT eval tomorrow for d/c planning, but assume SNF/rehab required   Planned Discharge Destination: Skilled nursing facility    Time spent: 44 minutes  Author: Ezekiel Slocumb, DO 07/27/2022 4:19 PM  For on call review [email or web address].

## 2022-07-27 NOTE — Consult Note (Signed)
ORTHOPAEDIC CONSULTATION  REQUESTING PHYSICIAN: Ezekiel Slocumb, DO  Chief Complaint: Right hip pain status post fall  HPI: Zoe [name] is a 89 y.o. female who complains of right hip pain status post fall.  Patient reported feeling a pop in her right hip which caused her to fall.  X-rays in the emergency department revealed a displaced right intertrochanteric hip fracture.  Patient was noted on CT scan to have a subdural hematoma.  She has been seen by Dr. Izora Ribas from neurosurgery in the hospital service.  Patient seen in her hospital room today and denies any significant right hip pain at rest.  He denies any numbness or tingling.  Past Medical History:  Diagnosis Date   Arthritis    Dementia ([place])    Diabetes mellitus without complication ([place])    Past Surgical History:  Procedure Laterality Date   CARDIAC SURGERY  2015   OPEN REDUCTION INTERNAL FIXATION (ORIF) TIBIA/FIBULA FRACTURE Right 05/29/2019   Procedure: OPEN REDUCTION INTERNAL FIXATION (ORIF) TIBIA/FIBULA FRACTURE;  Surgeon: Corky Mull, MD;  Location: [facility] ORS;  Service: Orthopedics;  Laterality: Right;   Social History   Socioeconomic History   Marital status: Married    Spouse name: Not on file   Number of children: Not on file   Years of education: Not on file   Highest education level: Not on file  Occupational History   Not on file  Tobacco Use   Smoking status: Former    Packs/day: 0.25    Years: 5.00    Total pack years: 1.25    Types: Cigarettes    Quit date: 05/22/1968    Years since quitting: 54.2   Smokeless tobacco: Never  Substance and Sexual Activity   Alcohol use: Never   Drug use: Never   Sexual activity: Not on file  Other Topics Concern   Not on file  Social History Narrative   Not on file   Social Determinants of Health   Financial Resource Strain: Not on file  Food Insecurity: Not on file  Transportation Needs: Not on file  Physical Activity: Not on file  Stress: Not on  file  Social Connections: Not on file   History reviewed. No pertinent family history. No Known Allergies Prior to Admission medications   Medication Sig Start Date End Date Taking? Authorizing Provider  levothyroxine (SYNTHROID) 88 MCG tablet Take 1 tablet (88 mcg total) by mouth daily at 6 (six) AM. 05/07/21 06/06/21  Max Sane, MD   CT HEAD WO CONTRAST (5MM)  Result Date: 07/27/2022 CLINICAL DATA:  89 year old female with mixed density left side subdural hematoma, recent trauma. EXAM: CT HEAD WITHOUT CONTRAST TECHNIQUE: Contiguous axial images were obtained from the base of the skull through the vertex without intravenous contrast. RADIATION DOSE REDUCTION: This exam was performed according to the departmental dose-optimization program which includes automated exposure control, adjustment of the mA and/or kV according to patient size and/or use of iterative reconstruction technique. COMPARISON:  Head CT 07/26/2022. FINDINGS: Brain: Mixed density left subdural hematoma with layering hematocrit level (series 3, image 22) and a mildly biconvex configuration, mildly loculated. Size and thickness stable, up to 17 mm. Mass effect on the left hemisphere, and mild mass effect on the left lateral ventricle. But no significant midline shift (coronal image 31). Basilar cisterns remain normal. No new intracranial hemorrhage. Stable ventricle size and configuration. Stable gray-white matter differentiation throughout the brain. No cortically based acute infarct identified. Vascular: Calcified atherosclerosis at the skull base.  No suspicious intracranial vascular hyperdensity. Skull: Stable and intact. Sinuses/Orbits: Visualized paranasal sinuses and mastoids are clear. Other: Postoperative changes to both globes. No acute orbit or scalp soft tissue finding. IMPRESSION: 1. Stable mixed density and mildly loculated appearing left subdural hematoma, up to 17 mm thick. Some mass effect on the left hemisphere, but no  significant midline shift. 2. No new intracranial abnormality. Electronically Signed   By: Genevie Ann M.D.   On: 07/27/2022 05:15   CT HEAD WO CONTRAST (5MM)  Result Date: 07/26/2022 CLINICAL DATA:  Head trauma, minor (Age >= 65y) EXAM: CT HEAD WITHOUT CONTRAST TECHNIQUE: Contiguous axial images were obtained from the base of the skull through the vertex without intravenous contrast. RADIATION DOSE REDUCTION: This exam was performed according to the departmental dose-optimization program which includes automated exposure control, adjustment of the mA and/or kV according to patient size and/or use of iterative reconstruction technique. COMPARISON:  Head CT 05/01/2021 FINDINGS: Brain: Mixed density left subdural hematoma primarily overlying temporal and frontoparietal regions has both acute and chronic components. This measures up to 17 mm in thickness, series 508, image 40. There is underlying mass effect on the subjacent brain parenchyma. No significant midline shift. Generalized atrophy and chronic small vessel ischemia. Remote left cerebellar lacunar infarct again seen. There is no evidence of acute ischemia. Vascular: Atherosclerosis of skullbase vasculature without hyperdense vessel or abnormal calcification. Skull: No fracture or focal lesion. Sinuses/Orbits: No acute findings. Bilateral cataract resection. Other: None. IMPRESSION: 1. Mixed density left subdural hematoma has both acute and chronic components. This measures up to 17 mm in thickness. There is underlying mass effect on the subjacent brain parenchyma. No significant midline shift. 2. Generalized atrophy and chronic small vessel ischemia. Remote left cerebellar lacunar infarct. Critical Value/emergent results were called by telephone at the time of interpretation on 07/26/2022 at 11:19 pm to provider [facility] , who verbally acknowledged these results. Electronically Signed   By: Keith Rake M.D.   On: 07/26/2022 23:19   CT Cervical Spine Wo  Contrast  Result Date: 07/26/2022 CLINICAL DATA:  Trauma EXAM: CT CERVICAL SPINE WITHOUT CONTRAST TECHNIQUE: Multidetector CT imaging of the cervical spine was performed without intravenous contrast. Multiplanar CT image reconstructions were also generated. RADIATION DOSE REDUCTION: This exam was performed according to the departmental dose-optimization program which includes automated exposure control, adjustment of the mA and/or kV according to patient size and/or use of iterative reconstruction technique. COMPARISON:  Cervical spine CT 05/01/2021 FINDINGS: Alignment: Normal. Skull base and vertebrae: No acute fracture. No primary bone lesion or focal pathologic process. Soft tissues and spinal canal: No prevertebral fluid or swelling. No visible canal hematoma. Disc levels: Disc bulge at C5-C6 causes mild central canal stenosis. Otherwise, no significant central canal or neural foraminal stenosis. There are mild degenerative changes of disc spaces throughout the cervical spine. There also mild degenerative facet changes throughout the cervical spine. Upper chest: Negative. Other: None. IMPRESSION: No acute fracture or traumatic subluxation of the cervical spine. Electronically Signed   By: Ronney Asters M.D.   On: 07/26/2022 23:04   DG Chest Portable 1 View  Result Date: 07/26/2022 CLINICAL DATA:  Mechanical fall EXAM: PORTABLE CHEST 1 VIEW COMPARISON:  05/01/2021 FINDINGS: Stable cardiomediastinal silhouette. Aortic atherosclerotic calcification. Sternotomy. AVR. Left chest wall pacemaker. Focal airspace opacity in the right lower lung. No pleural effusion or pneumothorax. No displaced rib fractures. Elevated right hemidiaphragm. IMPRESSION: 1. Focal airspace opacity in the right lower lung, favor atelectasis though pneumonia  is not excluded. Follow-up in 4-6 weeks is recommended to ensure resolution. 2. No displaced rib fractures. Electronically Signed   By: Placido Sou M.D.   On: 07/26/2022 22:17    DG Hip Unilat W or Wo Pelvis 2-3 Views Right  Result Date: 07/26/2022 CLINICAL DATA:  Fall, felt a pop in hip. Shortening and rotation of right lower extremity. EXAM: DG HIP (WITH OR WITHOUT PELVIS) 2-3V RIGHT COMPARISON:  None Available. FINDINGS: Mildly displaced comminuted intertrochanteric fracture of the right proximal femur. Fracture involves the lesser and base of the greater trochanter. Mild apex lateral angulation. Femoral head remains seated in the acetabulum, there is mild bilateral hip joint space narrowing. Intact pubic rami. No pubic symphysis or sacroiliac joint diastasis. IMPRESSION: Mildly displaced comminuted intertrochanteric fracture of the right proximal femur. Electronically Signed   By: Keith Rake M.D.   On: 07/26/2022 22:15    Positive ROS: All other systems have been reviewed and were otherwise negative with the exception of those mentioned in the HPI and as above.  Physical Exam: General: Alert, no acute distress, following commands  MUSCULOSKELETAL: Right hip: Patient's skin is intact.  There is no erythema ecchymosis or significant swelling.  The thigh and leg compartments are soft and compressible.  Assessment: Right displaced intertrochanteric hip fracture  Plan: I explained to the patient the nature of her injury as well as details of the operation of the postoperative course.  Patient understood and agreed with the plan for surgery.  I have made several attempts to contact the patient's son by phone.  Patient's nurse has spoken with the son and he has given consent for surgery.  I discussed the risks and benefits of surgery. The risks include but are not limited to infection, bleeding requiring blood transfusion, nerve or blood vessel injury, joint stiffness or loss of motion, persistent pain, weakness or instability, malunion, nonunion and hardware failure and the need for further surgery. Medical risks include but are not limited to DVT and pulmonary  embolism, myocardial infarction, stroke, pneumonia, respiratory failure and death.   I have reviewed the patient's x-rays and labs in preparation for this case.  Patient will remain n.p.o. in preparation for surgery today.   Thornton Park, MD    07/27/2022 11:21 AM

## 2022-07-27 NOTE — Assessment & Plan Note (Addendum)
On metformin and Actos in the past. Cover with sliding scale Novolog during admission Adjust for CBG goal 140-180

## 2022-07-28 DIAGNOSIS — S065XAA Traumatic subdural hemorrhage with loss of consciousness status unknown, initial encounter: Secondary | ICD-10-CM | POA: Diagnosis not present

## 2022-07-28 DIAGNOSIS — W19XXXA Unspecified fall, initial encounter: Secondary | ICD-10-CM

## 2022-07-28 LAB — CBC
HCT: 30.6 % — ABNORMAL LOW (ref 36.0–46.0)
Hemoglobin: 10 g/dL — ABNORMAL LOW (ref 12.0–15.0)
MCH: 32.4 pg (ref 26.0–34.0)
MCHC: 32.7 g/dL (ref 30.0–36.0)
MCV: 99 fL (ref 80.0–100.0)
Platelets: 154 10*3/uL (ref 150–400)
RBC: 3.09 MIL/uL — ABNORMAL LOW (ref 3.87–5.11)
RDW: 14.2 % (ref 11.5–15.5)
WBC: 10.4 10*3/uL (ref 4.0–10.5)
nRBC: 0 % (ref 0.0–0.2)

## 2022-07-28 LAB — BASIC METABOLIC PANEL
Anion gap: 7 (ref 5–15)
BUN: 14 mg/dL (ref 8–23)
CO2: 24 mmol/L (ref 22–32)
Calcium: 9.3 mg/dL (ref 8.9–10.3)
Chloride: 107 mmol/L (ref 98–111)
Creatinine, Ser: 0.65 mg/dL (ref 0.44–1.00)
GFR, Estimated: >60 mL/min (ref >60)
Glucose, Bld: 118 mg/dL — ABNORMAL HIGH (ref 70–99)
Potassium: 4.2 mmol/L (ref 3.5–5.1)
Sodium: 138 mmol/L (ref 135–145)

## 2022-07-28 LAB — GLUCOSE, CAPILLARY
Glucose-Capillary: 118 mg/dL — ABNORMAL HIGH (ref 70–99)
Glucose-Capillary: 121 mg/dL — ABNORMAL HIGH (ref 70–99)
Glucose-Capillary: 150 mg/dL — ABNORMAL HIGH (ref 70–99)
Glucose-Capillary: 166 mg/dL — ABNORMAL HIGH (ref 70–99)
Glucose-Capillary: 191 mg/dL — ABNORMAL HIGH (ref 70–99)

## 2022-07-28 MED ORDER — INSULIN ASPART 100 UNIT/ML IJ SOLN
0.0000 [IU] | Freq: Three times a day (TID) | INTRAMUSCULAR | Status: DC
  Administered 2022-07-28 (×2): 2 [IU] via SUBCUTANEOUS
  Administered 2022-07-29: 1 [IU] via SUBCUTANEOUS
  Administered 2022-07-29 (×2): 2 [IU] via SUBCUTANEOUS
  Filled 2022-07-28 (×5): qty 1

## 2022-07-28 MED ORDER — ADULT MULTIVITAMIN W/MINERALS CH
1.0000 | ORAL_TABLET | Freq: Every day | ORAL | Status: DC
  Administered 2022-07-28 – 2022-07-29 (×2): 1 via ORAL
  Filled 2022-07-28 (×2): qty 1

## 2022-07-28 MED ORDER — ENSURE ENLIVE PO LIQD
237.0000 mL | Freq: Two times a day (BID) | ORAL | Status: DC
  Administered 2022-07-28 – 2022-07-29 (×4): 237 mL via ORAL

## 2022-07-28 NOTE — Progress Notes (Signed)
    Attending Progress Note  History: Jaiah Weigel Cabello is here for R hip fracture.  Noted to have L SDH as well.  POD1 from hip: no headache. Doing well.   Physical Exam: Vitals:   07/28/22 0448 07/28/22 0753  BP: 134/70 130/67  Pulse: 80 84  Resp: 18 18  Temp: 98.1 F (36.7 C) 97.6 F (36.4 C)  SpO2: 96% 98%    AA Ox3 (somewhat hesitant on month but able to say without prompt){ CNI  Strength:5/5 throughout BUE without drift  Data:  Other tests/results: n/a  Assessment/Plan:  Melaney K Welford is stable post-op from R hip surgery.  Has known mixed density acute on chronic subdural hematoma.  - mobilize - pain control - DVT prophylaxis ok to start - PTOT - Call if any need for therapeutic anticoagulation   Meade Maw MD, [facility] Department of Neurosurgery

## 2022-07-28 NOTE — Evaluation (Signed)
Occupational Therapy Evaluation Patient Details Name: Zoe [name] MRN: [id number] DOB: June 12, 1926 Today's Date: 07/28/2022   History of Present Illness Pt is a 89 y.o. female presenting to hospital 07/26/22 (pt felt pop in R hip which then caused her to fall down and hit her head on the door); c/o pain in R leg.  Imaging showing mildly displaced comminuted intertrochanteric fx of R proximal femur; questionable fx of R inferior pubic ramus (may be artifactual), and L subdural hematoma with mass effect on subjacent brain parenchyma (no significant midline shift).  Repeat head CT at 6 hours was stable.  S/p intramedullary fixation for displaced comminuted R intertrochanteric hip fx 07/27/22.  PMH includes DM, htn, anemia, heart block with pacemaker, dementia, cardiac sx, ORIF tibia/fibular R fx 2021.   Clinical Impression   Patient received for OT evaluation. See flowsheet below for details of function. Generally, patient requiring MOD A for bed mobility, MOD A sit to stand from chair; MIN A for functional transfer with RW, and set up-MAX A for ADLs. Pt is very pleasant and willing to work with OT; oriented to self and who the president is; not to date, location, or situation.  Patient will benefit from continued OT while in acute care.       Recommendations for follow up therapy are one component of a multi-disciplinary discharge planning process, led by the attending physician.  Recommendations may be updated based on patient status, additional functional criteria and insurance authorization.   Follow Up Recommendations  Skilled nursing-short term rehab (<3 hours/day)     Assistance Recommended at Discharge Frequent or constant Supervision/Assistance  Patient can return home with the following A lot of help with walking and/or transfers;A lot of help with bathing/dressing/bathroom;Assistance with cooking/housework;Direct supervision/assist for medications management;Direct supervision/assist  for financial management;Assist for transportation;Help with stairs or ramp for entrance    Functional Status Assessment  Patient has had a recent decline in their functional status and demonstrates the ability to make significant improvements in function in a reasonable and predictable amount of time.  Equipment Recommendations  Other (comment) (defer to next venue of care)    Recommendations for Other Services       Precautions / Restrictions Precautions Precautions: Fall Precaution Comments: Aspiration Restrictions Weight Bearing Restrictions: Yes RLE Weight Bearing: Weight bearing as tolerated      Mobility Bed Mobility Overal bed mobility: Needs Assistance Bed Mobility: Sit to Supine       Sit to supine: Mod assist   General bed mobility comments: assist for RLE; verbal cues    Transfers Overall transfer level: Needs assistance Equipment used: Rolling walker (2 wheels) (and gait belt) Transfers: Sit to/from Stand, Bed to chair/wheelchair/BSC Sit to Stand: Mod assist (from recliner chair; using arm rests)     Step pivot transfers: Min assist (lots of cues for turning)     General transfer comment: cues      Balance   Sitting-balance support: No upper extremity supported, Feet supported Sitting balance-Leahy Scale: Good     Standing balance support: Bilateral upper extremity supported, Reliant on assistive device for balance Standing balance-Leahy Scale: Fair                             ADL either performed or assessed with clinical judgement   ADL Overall ADL's : Needs assistance/impaired     Grooming: Oral care;Wash/dry face;Bed level;Set up  General ADL Comments: Anticipate pt will need MIN-MOD A for toilet t/f's to [facility], RW for all transfers; assist for LB dressing of RLE 2/2 pain; able to don hospital gown today with set up. Able to perform peri hygiene with wet wipe from semi-reclined in  bed before OT placed a new primafit.     Vision Baseline Vision/History: 1 Wears glasses (pt wearing glasses during assessment; pt is able to read print along bottom of TV screen)       Perception     Praxis      Pertinent Vitals/Pain Pain Assessment Pain Assessment: PAINAD Breathing: normal Negative Vocalization: occasional moan/groan, low speech, negative/disapproving quality Facial Expression: smiling or inexpressive Body Language: relaxed Consolability: no need to console PAINAD Score: 1 Pain Location: R hip/thigh Pain Descriptors / Indicators: Tender, Sore Pain Intervention(s): Limited activity within patient's tolerance, Repositioned     Hand Dominance Right   Extremity/Trunk Assessment Upper Extremity Assessment Upper Extremity Assessment: Overall WFL for tasks assessed   Lower Extremity Assessment Lower Extremity Assessment: Defer to PT evaluation;Generalized weakness;RLE deficits/detail RLE Deficits / Details: hip fx IMN; see PT notes for details RLE: Unable to fully assess due to pain   Cervical / Trunk Assessment Cervical / Trunk Assessment: Normal   Communication Communication Communication: HOH   Cognition Arousal/Alertness: Awake/alert Behavior During Therapy: WFL for tasks assessed/performed Overall Cognitive Status: No family/caregiver present to determine baseline cognitive functioning (pt has dementia at baseline, but unknown if she is at her baseline today)                                 General Comments: Pt is oriented to self. States she is 89 years old and works as a Secretary/administrator currently. Pt stating that the name of this place is the [facility]. States she lives at [address] (not consistent with medical record); pt is able to give information about long ago history. Pt stating the year is 39. Pt is able to state the president accurately as [place]. Pt very sweet and agreeable throughout session; seemed to enjoy having someone  to talk to.     General Comments  Pt had some urinary incontinence while standing; TED hose doffed and SCD's applied; RN notified.    Exercises     Shoulder Instructions      Home Living Family/patient expects to be discharged to:: Private residence Living Arrangements: Children (adult son) Available Help at Discharge: Family Type of Home: House Home Access: Stairs to enter Technical brewer of Steps: 3-4 Entrance Stairs-Rails: Right;Left;Can reach both Home Layout: One level               Home Equipment: Cane - single point   Additional Comments: Need to verify home set up, as pt is not oriented.  Lives With: Son    Prior Functioning/Environment Prior Level of Function : Needs assist  Cognitive Assist :  (unknown)           Mobility Comments: Need to verify prior level with family; pt has baseline dementia          OT Problem List: Decreased strength;Decreased activity tolerance;Decreased range of motion;Impaired balance (sitting and/or standing);Decreased cognition;Decreased safety awareness      OT Treatment/Interventions: Self-care/ADL training;Therapeutic exercise;Therapeutic activities    OT Goals(Current goals can be found in the care plan section) Acute Rehab OT Goals Patient Stated Goal: Get home. (pt is unaware of why  she is in the hospital) OT Goal Formulation: With patient Time For Goal Achievement: 08/11/22 Potential to Achieve Goals: Good ADL Goals Pt Will Perform Lower Body Bathing: with modified independence;sit to/from stand Pt Will Perform Lower Body Dressing: with modified independence;sit to/from stand Pt Will Transfer to Toilet: with modified independence;bedside commode;ambulating Pt Will Perform Toileting - Clothing Manipulation and hygiene: with modified independence;sit to/from stand  OT Frequency: Min 2X/week    Co-evaluation              AM-PAC OT "6 Clicks" Daily Activity     Outcome Measure Help from another  person eating meals?: None Help from another person taking care of personal grooming?: A Little Help from another person toileting, which includes using toliet, bedpan, or urinal?: A Lot Help from another person bathing (including washing, rinsing, drying)?: A Lot Help from another person to put on and taking off regular upper body clothing?: A Little Help from another person to put on and taking off regular lower body clothing?: A Lot 6 Click Score: 16   End of Session Equipment Utilized During Treatment: Rolling walker (2 wheels);Gait belt Nurse Communication: Mobility status (need for new TED hose)  Activity Tolerance: Patient tolerated treatment well;Patient limited by pain Patient left: in bed;with call bell/phone within reach;with bed alarm set;with SCD's reapplied  OT Visit Diagnosis: Unsteadiness on feet (R26.81);History of falling (Z91.81)                Time: [id number] OT Time Calculation (min): 36 min Charges:  OT General Charges $OT Visit: 1 Visit OT Evaluation $OT Eval Moderate Complexity: 1 Mod OT Treatments $Self Care/Home Management : 23-37 mins  Waymon Amato, MS, OTR/L   Vania Rea 07/28/2022, 11:18 AM

## 2022-07-28 NOTE — NC FL2 (Signed)
[place] LEVEL OF CARE FORM     IDENTIFICATION  Patient Name: Zoe [name] [facility] Birthdate: Sep 09, 1926 Sex: female Admission Date (Current Location): 07/26/2022  [facility] and [place] Number:  Engineering geologist and Address:  [employer], [address], [place], [place] [address]      Provider Number: [id number]  Attending Physician Name and Address:  Ezekiel Slocumb, DO  Relative Name and Phone Number:  Elliot Gault [phone]    Current Level of Care: Hospital Recommended Level of Care: [place] Prior Approval Number:    Date Approved/Denied:   PASRR Number: [id number]  Discharge Plan: SNF    Current Diagnoses: Patient Active Problem List   Diagnosis Date Noted   Essential hypertension 07/27/2022   Dementia without behavioral disturbance ([place]) 07/27/2022   Controlled type 2 diabetes mellitus without complication, without long-term current use of insulin ([place]) 07/27/2022   Subdural hematoma ([place]) 07/27/2022   Closed right hip fracture ([place]) 07/26/2022   Hypothyroidism Q000111Q   Acute metabolic encephalopathy Q000111Q   Left lower lobe pneumonia 05/02/2021   AMS (altered mental status) 05/01/2021   AKI (acute kidney injury) ([place]) 05/01/2021   Macrocytic anemia    Fall at home, initial encounter 05/28/2019   Tibia fracture 05/28/2019   Left tibial fracture 05/28/2019   Essential (primary) hypertension 05/30/2014   Combined fat and carbohydrate induced hyperlipemia 05/30/2014   Diabetes mellitus, type 2 ([place]) 05/30/2014   Artificial cardiac pacemaker 03/11/2014   H/O aortic valve replacement 03/01/2014   Complete heart block, post-surgical ([place]) 02/18/2014   Bundle branch block, right 09/28/2013   AI (aortic incompetence) 09/24/2013   Aortic heart valve narrowing 09/24/2013   Diabetes ([place]) 09/24/2013   Breath shortness 09/24/2013   BP (high blood pressure) 09/24/2013   Adult hypothyroidism  09/24/2013   MI (mitral incompetence) 09/24/2013   Arthritis, degenerative 09/24/2013    Orientation RESPIRATION BLADDER Height & Weight     Self  Normal Incontinent Weight: 73.2 kg Height:  '5\' 8"'$  (172.7 cm)  BEHAVIORAL SYMPTOMS/MOOD NEUROLOGICAL BOWEL NUTRITION STATUS      Incontinent Diet  AMBULATORY STATUS COMMUNICATION OF NEEDS Skin   Limited Assist Verbally Surgical wounds                       Personal Care Assistance Level of Assistance  Bathing, Dressing Bathing Assistance: Limited assistance   Dressing Assistance: Limited assistance     Functional Limitations Info             SPECIAL CARE FACTORS FREQUENCY  PT (By licensed PT), OT (By licensed OT)     PT Frequency: 5 times a week OT Frequency: 5 times a week            Contractures Contractures Info: Not present    Additional Factors Info  Code Status, Allergies Code Status Info: DNR Allergies Info: NKA           Current Medications (07/28/2022):  This is the current hospital active medication list Current Facility-Administered Medications  Medication Dose Route Frequency Provider Last Rate Last Admin    stroke: early stages of recovery book   Does not apply Once Thornton Park, MD       0.9 %  sodium chloride infusion   Intravenous Continuous Thornton Park, MD   Stopped at 07/27/22 1737   0.9 %  sodium chloride infusion   Intravenous Continuous Thornton Park, MD 75 mL/hr at 07/27/22 1737 New  Bag at 07/27/22 1737   acetaminophen (TYLENOL) tablet 650 mg  650 mg Oral Q4H PRN Thornton Park, MD       Or   acetaminophen (TYLENOL) 160 MG/5ML solution 650 mg  650 mg Per Tube Q4H PRN Thornton Park, MD       Or   acetaminophen (TYLENOL) suppository 650 mg  650 mg Rectal Q4H PRN Thornton Park, MD       acetaminophen (TYLENOL) tablet 500 mg  500 mg Oral Q6H Thornton Park, MD   500 mg at 07/28/22 0501   acetaminophen (TYLENOL) tablet 650 mg  650 mg Oral Q4H PRN Thornton Park, MD        alum & mag hydroxide-simeth (MAALOX/MYLANTA) 200-200-20 MG/5ML suspension 30 mL  30 mL Oral Q4H PRN Thornton Park, MD       amLODipine (NORVASC) tablet 2.5 mg  2.5 mg Oral Daily Thornton Park, MD   2.5 mg at 07/28/22 [id number]   bisacodyl (DULCOLAX) suppository 10 mg  10 mg Rectal Daily PRN Thornton Park, MD       docusate sodium (COLACE) capsule 100 mg  100 mg Oral BID Thornton Park, MD   100 mg at 07/28/22 0909   enoxaparin (LOVENOX) injection 40 mg  40 mg Subcutaneous Q24H Thornton Park, MD   40 mg at 07/28/22 0802   feeding supplement (ENSURE ENLIVE / ENSURE PLUS) liquid 237 mL  237 mL Oral BID BM Nicole Kindred A, DO       HYDROcodone-acetaminophen (NORCO/VICODIN) 5-325 MG per tablet 1-2 tablet  1-2 tablet Oral Q4H PRN Thornton Park, MD       insulin aspart (novoLOG) injection 0-9 Units  0-9 Units Subcutaneous Q4H Thornton Park, MD   1 Units at 07/28/22 0802   labetalol (NORMODYNE) injection 20 mg  20 mg Intravenous Q3H PRN Thornton Park, MD       levothyroxine (SYNTHROID) tablet 88 mcg  88 mcg Oral Q0600 Thornton Park, MD   88 mcg at 07/28/22 0501   magnesium hydroxide (MILK OF MAGNESIA) suspension 30 mL  30 mL Oral Daily PRN Thornton Park, MD       methocarbamol (ROBAXIN) tablet 500 mg  500 mg Oral Q6H PRN Thornton Park, MD       Or   methocarbamol (ROBAXIN) 500 mg in dextrose 5 % 50 mL IVPB  500 mg Intravenous Q6H PRN Thornton Park, MD       morphine (PF) 2 MG/ML injection 0.5-1 mg  0.5-1 mg Intravenous Q2H PRN Thornton Park, MD       multivitamin with minerals tablet 1 tablet  1 tablet Oral Daily Nicole Kindred A, DO       mupirocin ointment (BACTROBAN) 2 % 1 Application  1 Application Nasal BID Thornton Park, MD   1 Application at [id number]   ondansetron (ZOFRAN) tablet 4 mg  4 mg Oral Q6H PRN Thornton Park, MD       Or   ondansetron [facility]) injection 4 mg  4 mg Intravenous Q6H PRN Thornton Park, MD       pantoprazole (PROTONIX)  injection 40 mg  40 mg Intravenous QHS Thornton Park, MD   40 mg at 07/27/22 2127   polyethylene glycol (MIRALAX / GLYCOLAX) packet 17 g  17 g Oral Daily PRN Thornton Park, MD       senna-docusate (Senokot-S) tablet 1 tablet  1 tablet Oral BID Thornton Park, MD   1 tablet at 07/28/22 0909   traMADol (ULTRAM) tablet 50 mg  50 mg Oral Q6H Thornton Park, MD   50 mg at 07/28/22 0501   traZODone (DESYREL) tablet 25 mg  25 mg Oral QHS PRN Thornton Park, MD   25 mg at 07/27/22 2127     Discharge Medications: Please see discharge summary for a list of discharge medications.  Relevant Imaging Results:  Relevant Lab Results:   Additional Information ss# [id number]  Valente David, RN

## 2022-07-28 NOTE — Progress Notes (Signed)
Subjective:  POD #1 s/p intramedullary fixation for right intertrochanteric hip fracture.   Patient reports right hip pain as mild.  Patient is sitting up in bed and awake.  The deacon from her church is at the bedside.  Patient is mildly confused stating that she does not mind cleaning the dishes from her lunch.  Objective:   VITALS:   Vitals:   07/27/22 2337 07/28/22 0448 07/28/22 0753 07/28/22 1256  BP: (!) 111/94 134/70 130/67 120/60  Pulse: 83 80 84 84  Resp: '18 18 18 18  '$ Temp: 98.3 F (36.8 C) 98.1 F (36.7 C) 97.6 F (36.4 C) 98.7 F (37.1 C)  TempSrc:      SpO2: 99% 96% 98% 92%  Weight:      Height:        PHYSICAL EXAM: Right lower extremity Neurovascular intact Sensation intact distally Intact pulses distally Dorsiflexion/Plantar flexion intact Incision: dressing C/D/I No cellulitis present Compartment soft  LABS  Results for orders placed or performed during the hospital encounter of 07/26/22 (from the past 24 hour(s))  Glucose, capillary     Status: Abnormal   Collection Time: 07/27/22  4:30 PM  Result Value Ref Range   Glucose-Capillary 142 (H) 70 - 99 mg/dL  Glucose, capillary     Status: Abnormal   Collection Time: 07/27/22  5:51 PM  Result Value Ref Range   Glucose-Capillary 162 (H) 70 - 99 mg/dL  Glucose, capillary     Status: Abnormal   Collection Time: 07/27/22  8:26 PM  Result Value Ref Range   Glucose-Capillary 189 (H) 70 - 99 mg/dL  Glucose, capillary     Status: Abnormal   Collection Time: 07/27/22 11:37 PM  Result Value Ref Range   Glucose-Capillary 133 (H) 70 - 99 mg/dL  Glucose, capillary     Status: Abnormal   Collection Time: 07/28/22  4:54 AM  Result Value Ref Range   Glucose-Capillary 118 (H) 70 - 99 mg/dL  CBC     Status: Abnormal   Collection Time: 07/28/22  5:13 AM  Result Value Ref Range   WBC 10.4 4.0 - 10.5 K/uL   RBC 3.09 (L) 3.87 - 5.11 MIL/uL   Hemoglobin 10.0 (L) 12.0 - 15.0 g/dL   HCT 30.6 (L) 36.0 - 46.0 %    MCV 99.0 80.0 - 100.0 fL   MCH 32.4 26.0 - 34.0 pg   MCHC 32.7 30.0 - 36.0 g/dL   RDW 14.2 11.5 - 15.5 %   Platelets 154 150 - 400 K/uL   nRBC 0.0 0.0 - 0.2 %  Basic metabolic panel     Status: Abnormal   Collection Time: 07/28/22  5:13 AM  Result Value Ref Range   Sodium 138 135 - 145 mmol/L   Potassium 4.2 3.5 - 5.1 mmol/L   Chloride 107 98 - 111 mmol/L   CO2 24 22 - 32 mmol/L   Glucose, Bld 118 (H) 70 - 99 mg/dL   BUN 14 8 - 23 mg/dL   Creatinine, Ser 0.65 0.44 - 1.00 mg/dL   Calcium 9.3 8.9 - 10.3 mg/dL   GFR, Estimated >60 >60 mL/min   Anion gap 7 5 - 15  Glucose, capillary     Status: Abnormal   Collection Time: 07/28/22  7:52 AM  Result Value Ref Range   Glucose-Capillary 121 (H) 70 - 99 mg/dL  Glucose, capillary     Status: Abnormal   Collection Time: 07/28/22 11:28 AM  Result Value Ref Range  Glucose-Capillary 150 (H) 70 - 99 mg/dL    DG FEMUR PORT, MIN 2 VIEWS RIGHT  Result Date: 07/27/2022 CLINICAL DATA:  Status post right femoral nail placement EXAM: RIGHT FEMUR PORTABLE 2 VIEW COMPARISON:  Right hip radiograph dated 07/26/2022 FINDINGS: Status post right femoral fixation with intramedullary rod placement. Hardware appears intact. Alignment is anatomic. Fracture lucency through the intertrochanteric right femur is visible. Subcutaneous emphysema and overlying surgical staples, postsurgical. Questionable fracture of the right inferior pubic ramus, which may be artifactual. Prior right knee arthroplasty. Old healed fracture of the proximal fibula. IMPRESSION: 1. Status post right intertrochanteric fracture fixation with intramedullary rod placement. Alignment is anatomic. 2. Questionable fracture of the right inferior pubic ramus, which may be artifactual. Electronically Signed   By: Darrin Nipper M.D.   On: 07/27/2022 17:14   DG HIP UNILAT WITH PELVIS 2-3 VIEWS RIGHT  Result Date: 07/27/2022 CLINICAL DATA:  Fracture. EXAM: DG HIP (WITH OR WITHOUT PELVIS) 2-3V RIGHT  COMPARISON:  Preoperative imaging. FINDINGS: Six fluoroscopic spot views of the right hip obtained in the operating room. Intramedullary nail with trans trochanteric and distal locking screw fixation traverse proximal femur fracture. Fluoroscopy time 100.9 seconds. Dose not provided. IMPRESSION: Fluoroscopic spot views during right hip fracture ORIF. Electronically Signed   By: Keith Rake M.D.   On: 07/27/2022 16:18   DG C-Arm 1-60 Min-No Report  Result Date: 07/27/2022 Fluoroscopy was utilized by the requesting physician.  No radiographic interpretation.   CT HEAD WO CONTRAST (5MM)  Result Date: 07/27/2022 CLINICAL DATA:  89 year old female with mixed density left side subdural hematoma, recent trauma. EXAM: CT HEAD WITHOUT CONTRAST TECHNIQUE: Contiguous axial images were obtained from the base of the skull through the vertex without intravenous contrast. RADIATION DOSE REDUCTION: This exam was performed according to the departmental dose-optimization program which includes automated exposure control, adjustment of the mA and/or kV according to patient size and/or use of iterative reconstruction technique. COMPARISON:  Head CT 07/26/2022. FINDINGS: Brain: Mixed density left subdural hematoma with layering hematocrit level (series 3, image 22) and a mildly biconvex configuration, mildly loculated. Size and thickness stable, up to 17 mm. Mass effect on the left hemisphere, and mild mass effect on the left lateral ventricle. But no significant midline shift (coronal image 31). Basilar cisterns remain normal. No new intracranial hemorrhage. Stable ventricle size and configuration. Stable gray-white matter differentiation throughout the brain. No cortically based acute infarct identified. Vascular: Calcified atherosclerosis at the skull base. No suspicious intracranial vascular hyperdensity. Skull: Stable and intact. Sinuses/Orbits: Visualized paranasal sinuses and mastoids are clear. Other: Postoperative  changes to both globes. No acute orbit or scalp soft tissue finding. IMPRESSION: 1. Stable mixed density and mildly loculated appearing left subdural hematoma, up to 17 mm thick. Some mass effect on the left hemisphere, but no significant midline shift. 2. No new intracranial abnormality. Electronically Signed   By: Genevie Ann M.D.   On: 07/27/2022 05:15   CT HEAD WO CONTRAST (5MM)  Result Date: 07/26/2022 CLINICAL DATA:  Head trauma, minor (Age >= 65y) EXAM: CT HEAD WITHOUT CONTRAST TECHNIQUE: Contiguous axial images were obtained from the base of the skull through the vertex without intravenous contrast. RADIATION DOSE REDUCTION: This exam was performed according to the departmental dose-optimization program which includes automated exposure control, adjustment of the mA and/or kV according to patient size and/or use of iterative reconstruction technique. COMPARISON:  Head CT 05/01/2021 FINDINGS: Brain: Mixed density left subdural hematoma primarily overlying temporal and frontoparietal regions  has both acute and chronic components. This measures up to 17 mm in thickness, series 508, image 40. There is underlying mass effect on the subjacent brain parenchyma. No significant midline shift. Generalized atrophy and chronic small vessel ischemia. Remote left cerebellar lacunar infarct again seen. There is no evidence of acute ischemia. Vascular: Atherosclerosis of skullbase vasculature without hyperdense vessel or abnormal calcification. Skull: No fracture or focal lesion. Sinuses/Orbits: No acute findings. Bilateral cataract resection. Other: None. IMPRESSION: 1. Mixed density left subdural hematoma has both acute and chronic components. This measures up to 17 mm in thickness. There is underlying mass effect on the subjacent brain parenchyma. No significant midline shift. 2. Generalized atrophy and chronic small vessel ischemia. Remote left cerebellar lacunar infarct. Critical Value/emergent results were called by  telephone at the time of interpretation on 07/26/2022 at 11:19 pm to provider [facility] , who verbally acknowledged these results. Electronically Signed   By: Keith Rake M.D.   On: 07/26/2022 23:19   CT Cervical Spine Wo Contrast  Result Date: 07/26/2022 CLINICAL DATA:  Trauma EXAM: CT CERVICAL SPINE WITHOUT CONTRAST TECHNIQUE: Multidetector CT imaging of the cervical spine was performed without intravenous contrast. Multiplanar CT image reconstructions were also generated. RADIATION DOSE REDUCTION: This exam was performed according to the departmental dose-optimization program which includes automated exposure control, adjustment of the mA and/or kV according to patient size and/or use of iterative reconstruction technique. COMPARISON:  Cervical spine CT 05/01/2021 FINDINGS: Alignment: Normal. Skull base and vertebrae: No acute fracture. No primary bone lesion or focal pathologic process. Soft tissues and spinal canal: No prevertebral fluid or swelling. No visible canal hematoma. Disc levels: Disc bulge at C5-C6 causes mild central canal stenosis. Otherwise, no significant central canal or neural foraminal stenosis. There are mild degenerative changes of disc spaces throughout the cervical spine. There also mild degenerative facet changes throughout the cervical spine. Upper chest: Negative. Other: None. IMPRESSION: No acute fracture or traumatic subluxation of the cervical spine. Electronically Signed   By: Ronney Asters M.D.   On: 07/26/2022 23:04   DG Chest Portable 1 View  Result Date: 07/26/2022 CLINICAL DATA:  Mechanical fall EXAM: PORTABLE CHEST 1 VIEW COMPARISON:  05/01/2021 FINDINGS: Stable cardiomediastinal silhouette. Aortic atherosclerotic calcification. Sternotomy. AVR. Left chest wall pacemaker. Focal airspace opacity in the right lower lung. No pleural effusion or pneumothorax. No displaced rib fractures. Elevated right hemidiaphragm. IMPRESSION: 1. Focal airspace opacity in the right  lower lung, favor atelectasis though pneumonia is not excluded. Follow-up in 4-6 weeks is recommended to ensure resolution. 2. No displaced rib fractures. Electronically Signed   By: Placido Sou M.D.   On: 07/26/2022 22:17   DG Hip Unilat W or Wo Pelvis 2-3 Views Right  Result Date: 07/26/2022 CLINICAL DATA:  Fall, felt a pop in hip. Shortening and rotation of right lower extremity. EXAM: DG HIP (WITH OR WITHOUT PELVIS) 2-3V RIGHT COMPARISON:  None Available. FINDINGS: Mildly displaced comminuted intertrochanteric fracture of the right proximal femur. Fracture involves the lesser and base of the greater trochanter. Mild apex lateral angulation. Femoral head remains seated in the acetabulum, there is mild bilateral hip joint space narrowing. Intact pubic rami. No pubic symphysis or sacroiliac joint diastasis. IMPRESSION: Mildly displaced comminuted intertrochanteric fracture of the right proximal femur. Electronically Signed   By: Keith Rake M.D.   On: 07/26/2022 22:15    Assessment/Plan: 1 Day Post-Op   Principal Problem:   Closed right hip fracture [facility]) Active Problems:   Hypothyroidism  Essential hypertension   Dementia without behavioral disturbance ([place])   Controlled type 2 diabetes mellitus without complication, without long-term current use of insulin ([facility])   Subdural hematoma ([facility])  Patient is stable postop.  Her vital signs are stable.  Her hemoglobin and hematocrit are within acceptable limits.  Patient will continue physical and Occupational Therapy.  Patient will need a skilled nursing facility upon discharge.  Patient ordered for Lovenox for DVT prophylaxis.  Patient being followed by neurosurgery for subdural hematoma.    Thornton Park , MD 07/28/2022, 2:30 PM

## 2022-07-28 NOTE — Evaluation (Signed)
Speech Language Pathology Evaluation Patient Details Name: Zoe [name] MRN: [id number] DOB: 07/11/1926 Today's Date: 07/28/2022 Time: [id number] SLP Time Calculation (min) (ACUTE ONLY): 15 min  Problem List:  Patient Active Problem List   Diagnosis Date Noted   Essential hypertension 07/27/2022   Dementia without behavioral disturbance ([place]) 07/27/2022   Controlled type 2 diabetes mellitus without complication, without long-term current use of insulin ([place]) 07/27/2022   Subdural hematoma (HCC) 07/27/2022   Closed right hip fracture ([place]) 07/26/2022   Hypothyroidism Q000111Q   Acute metabolic encephalopathy Q000111Q   Left lower lobe pneumonia 05/02/2021   AMS (altered mental status) 05/01/2021    Class: Acute   AKI (acute kidney injury) ([place]) 05/01/2021   Macrocytic anemia    Fall at home, initial encounter 05/28/2019   Tibia fracture 05/28/2019   Left tibial fracture 05/28/2019   Essential (primary) hypertension 05/30/2014   Combined fat and carbohydrate induced hyperlipemia 05/30/2014   Diabetes mellitus, type 2 ([place]) 05/30/2014   Artificial cardiac pacemaker 03/11/2014   H/O aortic valve replacement 03/01/2014   Complete heart block, post-surgical ([place]) 02/18/2014   Bundle branch block, right 09/28/2013   AI (aortic incompetence) 09/24/2013   Aortic heart valve narrowing 09/24/2013   Diabetes ([place]) 09/24/2013   Breath shortness 09/24/2013   BP (high blood pressure) 09/24/2013   Adult hypothyroidism 09/24/2013   MI (mitral incompetence) 09/24/2013   Arthritis, degenerative 09/24/2013   Past Medical History:  Past Medical History:  Diagnosis Date   Arthritis    Dementia ([place])    Diabetes mellitus without complication ([place])    Past Surgical History:  Past Surgical History:  Procedure Laterality Date   CARDIAC SURGERY  2015   OPEN REDUCTION INTERNAL FIXATION (ORIF) TIBIA/FIBULA FRACTURE Right 05/29/2019   Procedure: OPEN REDUCTION INTERNAL FIXATION (ORIF)  TIBIA/FIBULA FRACTURE;  Surgeon: Corky Mull, MD;  Location: [facility] ORS;  Service: Orthopedics;  Laterality: Right;   HPI:  Per H&P "Zoe [name] is a 89 y.o. African-American female with medical history significant for type 2 diabetes mellitus, hypertension, hypothyroidism dementia and osteoarthritis and pacemaker placement, who presented to the ER with acute onset of fall.  The patient reportedly lives with a family member and reports feeling a pop in her right hip that then caused her to fall down.  She did not have any head injury but admitted to hitting her head on the door.  She was having right lower extremity pain.  No headache or dizziness or blurred vision.  She denied any nausea or vomiting then vomited once in the emergency room with nonbilious vomitus and without hematemesis.  No melena or bright red bleeding per rectum.  No fever or chills.  No chest pain or palpitations.  She denied any paresthesias or focal muscle weakness.  She was lying in bed on her right hip.     ED Course: When she came to the ER BP was 143/82 with a respiratory rate of 21 with otherwise normal vital signs.  Labs revealed blood glucose 151 with calcium 10.5 and high sensitive troponin 5.  CBC showed macrocytosis.     EKG as reviewed by me : EKG showed paced rhythm with a rate of 82 with PVCs  Imaging: Portable chest x-ray showed the following:  1. Focal airspace opacity in the right lower lung, favor atelectasis  though pneumonia is not excluded. Follow-up in 4-6 weeks is  recommended to ensure resolution.  2. No displaced rib fractures.  Right hip x-ray showed mildly  displaced comminuted intertrochanteric fracture of the right proximal femur.     C-spine CT showed no acute fracture or traumatic subluxation of the C-spine.  Noncontrasted head CT scan revealed the following:  1. Mixed density left subdural hematoma has both acute and chronic  components. This measures up to 17 mm in thickness. There is  underlying mass  effect on the subjacent brain parenchyma. No  significant midline shift.  2. Generalized atrophy and chronic small vessel ischemia. Remote  left cerebellar lacunar infarct."   Assessment / Plan / Recommendation Clinical Impression  Pt seen for cognitive-linguistic evaluation. Pt alert and cooperative. Received upright in recliner. Just finished working with PT.  Noted pt with hx of dementia. ?pt's baseline functional cognitive-linguistic status. Pt unreliable historian and no family present to provide collateral information.   Evaluation completed via informal means and portions of Cognistat. Evaluation likely impacted by pt's hearing status; however, pt presents with s/sx cognitive-linguistic deficits affecting attention (sustained, focused, auditory), orientation (temporal, spatial, situational), memory (immediate, short term, working), problem solving (verbal, functional), abstract reasoning (verbal), and insight. Pt with reduced auditory comprehension for 2-step commands, simple/complex yes/no questions, and likely lengthier auditory comprehension given above mentioned cognitive-linguistic deficits. Pt's speech is fluent and clear. Mild semantic paraphasias appreciated during confrontation naming task. Pt with reduced initiation/topic maintenance. Appropriate affect and non-verbal communication throughout.   Recommend further evaluation/tx of pt's cognitive-linguistic ability at next level of care in a more functional setting given pt's clinical presentation and hx of dementia.   RN made aware of results, recommendations, and SLP POC.   Of note, pt observed taking pills x1 at a time with water with no observed difficulty. RN noted that he had been given report that pt doing well with current diet. No concerns for oropharyngeal dysphagia at this time.   SLP to sign off as pt has no acute SLP needs at this time.     SLP Assessment  SLP Recommendation/Assessment: All further Speech Lanaguage  Pathology  needs can be addressed in the next venue of care SLP Visit Diagnosis: Cognitive communication deficit (R41.841)    Recommendations for follow up therapy are one component of a multi-disciplinary discharge planning process, led by the attending physician.  Recommendations may be updated based on patient status, additional functional criteria and insurance authorization.    Follow Up Recommendations  Skilled nursing-short term rehab (<3 hours/day)    Assistance Recommended at Discharge  Frequent or constant Supervision/Assistance  Functional Status Assessment Patient has had a recent decline in their functional status and demonstrates the ability to make significant improvements in function in a reasonable and predictable amount of time.  Frequency and Duration           SLP Evaluation Cognition  Overall Cognitive Status: No family/caregiver present to determine baseline cognitive functioning Arousal/Alertness: Awake/alert Orientation Level: Oriented to person;Disoriented to place;Disoriented to time;Disoriented to situation Attention: Focused;Sustained Focused Attention: Impaired Focused Attention Impairment: Verbal basic Sustained Attention: Impaired Sustained Attention Impairment: Verbal basic Memory: Impaired Memory Impairment: Storage deficit;Retrieval deficit;Decreased recall of new information;Decreased short term memory Decreased Short Term Memory: Verbal basic Awareness: Impaired Problem Solving: Impaired Problem Solving Impairment: Verbal basic;Functional basic Executive Function: Reasoning Reasoning: Impaired Reasoning Impairment: Verbal basic Safety/Judgment: Impaired       Comprehension  Auditory Comprehension Overall Auditory Comprehension: Impaired Yes/No Questions: Impaired Basic Biographical Questions: 76-100% accurate Basic Immediate Environment Questions: 50-74% accurate Complex Questions: 50-74% accurate Commands: Impaired Two Step Basic  Commands: 25-49% accurate Interfering Components: Attention;Hearing;Processing  speed;Working Field seismologist: Extra processing time;Increased volume;Repetition;Visual/Gestural cues Visual Recognition/Discrimination Discrimination: Not tested Reading Comprehension Reading Status: Not tested    Expression Expression Primary Mode of Expression: Verbal Verbal Expression Automatic Speech: Social Response (WFL) Repetition: Impaired Level of Impairment: Phrase level Naming: Impairment Confrontation: Impaired Convergent: 75-100% accurate Verbal Errors: Semantic paraphasias Pragmatics: Impairment Impairments: Topic maintenance Written Expression Written Expression: Not tested   Oral / Motor  Oral Motor/Sensory Function Overall Oral Motor/Sensory Function: Within functional limits Motor Speech Overall Motor Speech: Appears within functional limits for tasks assessed Respiration: Within functional limits Phonation: Normal Resonance: Within functional limits Articulation: Within functional limitis Intelligibility: Intelligible Motor Planning: Witnin functional limits           Cherrie Gauze, M.S., [facility] [phone] ([place])  Quintella Baton 07/28/2022, 10:13 AM

## 2022-07-28 NOTE — TOC Initial Note (Signed)
Transition of Care [facility]) - Initial/Assessment Note    Patient Details  Name: Zoe [name] MRN: [id number] Date of Birth: [birth year]  [facility]) CM/SW Contact:    Valente David, RN Phone Number: 07/28/2022, 11:18 AM  Clinical Narrative:                  Patient admitted to hospital after fall at home resulting in hip fracture.  Lives with son Marcello Moores, sees Dr. Ginette Pitman for PCP care.  Receives medications from mail order pharmacy.  Advised son of recommendations for SNF for short term rehab, he agrees. [facility] and [facility] are first 2 choices, but open to others in [place].  FL2 comleted, bed search initiated.    Expected Discharge Plan: Skilled Nursing Facility Barriers to Discharge: Continued Medical Work up   Patient Goals and CMS Choice   CMS Medicare.gov Compare Post Acute Care list provided to:: Patient Represenative (must comment) (Son, Marcello Moores) Choice offered to / list presented to : Adult Children      Expected Discharge Plan and Services     Post Acute Care Choice: [place] Living arrangements for the past 2 months: Single Family Home                   DME Agency: NA                  Prior Living Arrangements/Services Living arrangements for the past 2 months: Single Family Home Lives with:: Adult Children Patient language and need for interpreter reviewed:: Yes        Need for Family Participation in Patient Care: Yes (Comment) Care giver support system in place?: Yes (comment) Current home services: DME    Activities of Daily Living Home Assistive Devices/Equipment: Eyeglasses, Dentures (specify type) ADL Screening (condition at time of admission) Patient's cognitive ability adequate to safely complete daily activities?: Yes Is the patient deaf or have difficulty hearing?: No Does the patient have difficulty seeing, even when wearing glasses/contacts?: No Does the patient have difficulty concentrating, remembering,  or making decisions?: No Patient able to express need for assistance with ADLs?: Yes Does the patient have difficulty dressing or bathing?: Yes Independently performs ADLs?: No Does the patient have difficulty walking or climbing stairs?: Yes Weakness of Legs: Right Weakness of Arms/Hands: None  Permission Sought/Granted   Permission granted to share information with : Yes, Verbal Permission Granted  Share Information with NAME: Marcello Moores  Permission granted to share info w AGENCY: SNF  Permission granted to share info w Relationship: Son     Emotional Assessment           Psych Involvement: No (comment)  Admission diagnosis:  Closed right hip fracture ([place]) [S72.001A] Fall, initial encounter [W19.XXXA] Closed fracture of right hip, initial encounter [facility]) [S72.001A] Patient Active Problem List   Diagnosis Date Noted   Essential hypertension 07/27/2022   Dementia without behavioral disturbance ([place]) 07/27/2022   Controlled type 2 diabetes mellitus without complication, without long-term current use of insulin ([place]) 07/27/2022   Subdural hematoma ([place]) 07/27/2022   Closed right hip fracture ([place]) 07/26/2022   Hypothyroidism Q000111Q   Acute metabolic encephalopathy Q000111Q   Left lower lobe pneumonia 05/02/2021   AMS (altered mental status) 05/01/2021    Class: Acute   AKI (acute kidney injury) ([place]) 05/01/2021   Macrocytic anemia    Fall at home, initial encounter 05/28/2019   Tibia fracture 05/28/2019   Left tibial fracture 05/28/2019   Essential (  primary) hypertension 05/30/2014   Combined fat and carbohydrate induced hyperlipemia 05/30/2014   Diabetes mellitus, type 2 ([place]) 05/30/2014   Artificial cardiac pacemaker 03/11/2014   H/O aortic valve replacement 03/01/2014   Complete heart block, post-surgical ([place]) 02/18/2014   Bundle branch block, right 09/28/2013   AI (aortic incompetence) 09/24/2013   Aortic heart valve narrowing 09/24/2013   Diabetes ([place])  09/24/2013   Breath shortness 09/24/2013   BP (high blood pressure) 09/24/2013   Adult hypothyroidism 09/24/2013   MI (mitral incompetence) 09/24/2013   Arthritis, degenerative 09/24/2013   PCP:  Merryl Hacker, No Pharmacy:   [facility], [address] [place] [address] Phone: [phone] Fax: [phone]     Social Determinants of Health (SDOH) Social History: SDOH Screenings   Tobacco Use: Medium Risk (07/26/2022)   SDOH Interventions:     Readmission Risk Interventions     No data to display

## 2022-07-28 NOTE — Progress Notes (Signed)
Initial Nutrition Assessment RD working remotely.   DOCUMENTATION CODES:   Not applicable  INTERVENTION:  - ordered Ensure Plus High Protein BID, each supplement provides 350 kcal and 20 grams of protein.  - ordered 1 tablet multivitamin with minerals/day.  - complete NFPE when feasible.   NUTRITION DIAGNOSIS:   Increased nutrient needs related to hip fracture, post-op healing as evidenced by estimated needs.  GOAL:   Patient will meet greater than or equal to 90% of their needs  MONITOR:   PO intake, Supplement acceptance, Labs, Weight trends, Skin  REASON FOR ASSESSMENT:   Consult Hip fracture protocol  ASSESSMENT:   89 y.o. female with medical history of type 2 DM, HTN, hypothyroidism, dementia, osteoarthritis, and pacemaker placement. She presented to the ED after a fall. She felt a pop in her R hip which caused her to fall.  Patient is noted to be a/o to self and situation, disoriented to time and place. She is POD #1 IM fixation for displaced comminuted R intertrochanteric hip fx.   Diet advanced from NPO to Regular, thin liquids yesterday at 1726 and she ate 100% of dinner.  She has not been assessed by a [place] RD at any time in the past.  Weight yesterday was 161 lb and PTA the most recently documented weight within [facility] was 189 lb on 05/02/21. This indicates 28 lb / 14.8% wt loss in ~15 months. Weight on 10/04/21 at [facility] was 160 lb. Yesterday edema to RLE was documented as deep pitting at 0758, moderate pitting at 0811, and mild pitting at 2030. No documentation this AM.   Labs reviewed; CBGs: 118 and 121 mg/dl.  Medications reviewed; 100 mg colace BID, sliding scale novolog, 88 mcg oral synthroid/day, 40 mg IV protonix/day, 1 tablet senokot BID.  IVF; NS @ 100 ml/hr.    NUTRITION - FOCUSED PHYSICAL EXAM:  RD working remotely.  Diet Order:   Diet Order             Diet regular Room service appropriate? Yes; Fluid consistency: Thin   Diet effective now                   EDUCATION NEEDS:   No education needs have been identified at this time  Skin:  Skin Assessment: Skin Integrity Issues: Skin Integrity Issues:: Incisions Incisions: R hip  Last BM:  PTA/unknown  Height:   Ht Readings from Last 1 Encounters:  07/26/22 '5\' 8"'$  (1.727 m)    Weight:   Wt Readings from Last 1 Encounters:  07/27/22 73.2 kg     BMI:  Body mass index is 24.54 kg/m.  Estimated Nutritional Needs:  Kcal:  1600-1800 kcal Protein:  75-90 grams Fluid:  >/= 1.8 L/day     Jarome Matin, MS, RD, LDN, CNSC Clinical Dietitian PRN/Relief staff On-call/weekend pager # available in [facility]

## 2022-07-28 NOTE — Progress Notes (Signed)
Progress Note   Patient: Zoe [name] [facility] [id number] DOB: May 14, 1927 DOA: 07/26/2022     2 DOS: the patient was seen and examined on 07/28/2022   Brief hospital course: Zoe [name] is a 89 y.o. female with medical history significant for type 2 diabetes mellitus, hypertension, hypothyroidism dementia and osteoarthritis and pacemaker placement, who presented to the ER on 07/26/2022 for evaluation after having a fall.  Patient reportedly lives with a family member and reports feeling a pop in her right hip that then caused her to fall down.  She did not have any head injury but admitted to hitting her head on the door.  She was having right lower extremity pain.  No headache or dizziness or blurred vision.  She denied any nausea or vomiting then vomited once in the emergency room  In the ED, patient was round to have a mildly displaced comminuted intertrochanteric right proximal femur fracture on x-ray of Right hip. CT head showed a mixed density left subdural hematoma has both acute and chronic components.  measures up to 17 mm in thickness. There is underlying mass effect on the subjacent brain parenchyma. No significant midline shift C-spine CT showed no acute fracture or traumatic subluxation. Chest xray showed a RLL focal opacity felt to be atelectasis, pna not excluded and follow up xray in 4-6 weeks recommended to ensure resolution.   Admitted to medicine service in stepdown unit for close neurological monitoring.  Neurosurgery was consulted.  Head CT was repeated and subdural hematoma was stable.  Orthopedic surgery consulted and plan for surgical repair of the hip fracture.  3/9 -- stable neurologically, transferred to medical floor.      Assessment and Plan: * Closed right hip fracture ([place]) Ortho consulted -- taking to OR later today Diet resumed post op 3/9 PT/OT recommending SNF Pain control PRN Bowel regimen Lovenox for DVT prophylaxis  Activity restrictions per  ortho  Subdural hematoma [facility]) Neurosurgery consulted. Repeat head CT at 6 hours was stable  --No neurosurgical intervention at this time --Cleared for hip surgery from neuro standpoint --Okay to  start DVT ppx post-op --If therapeutic dose anticoagulation were to become indicated, reach back out to neurosurgery --No anti-seizure meds recommended due to advanced and and risk of polypharmacy and adverse side effects  Essential hypertension Continue home low dose Norvasc  PRN IV labetalol while NPO  Hypothyroidism Continue Synthroid   Controlled type 2 diabetes mellitus without complication, without long-term current use of insulin (HCC) On metformin and Actos in the past. Cover with sliding scale Novolog during admission Adjust for CBG goal 140-180  Dementia without behavioral disturbance (HCC) No current behavioral changes. Appears no longer taking Aricept. Delirium precautions        Subjective: Pt was up in recliner having breakfast when seen this AM.  She denies any hip pain.  Says she feels well.   Physical Exam: Vitals:   07/27/22 2024 07/27/22 2337 07/28/22 0448 07/28/22 0753  BP: 112/65 (!) 111/94 134/70 130/67  Pulse: 82 83 80 84  Resp: '16 18 18 18  '$ Temp: 97.6 F (36.4 C) 98.3 F (36.8 C) 98.1 F (36.7 C) 97.6 F (36.4 C)  TempSrc:      SpO2: 96% 99% 96% 98%  Weight:      Height:       General exam: awake, alert, no acute distress HEENT: moist mucus membranes, hearing grossly normal  Respiratory system: on room air, normal respiratory effort. Cardiovascular system: RRR, 2+ DP pulses  b/l Gastrointestinal system: soft, NT, ND Central nervous system: A&O x self and hospital. no gross focal neurologic deficits, normal speech Extremities: TED hose on BLE's Psychiatry: normal mood, congruent affect, abnormal judgement and insight due to dementia   Data Reviewed:  Notable labs ---- normal BMP except glucose 118.  Hbg 10.0 from 11.3 (post op day 1)    Family Communication: None present, will attempt to call  Disposition: Status is: Inpatient Remains inpatient appropriate because: Needs SNF/rehab placement   Planned Discharge Destination: Skilled nursing facility    Time spent: 36 minutes  Author: Ezekiel Slocumb, DO 07/28/2022 11:44 AM  For on call review [email or web address].

## 2022-07-28 NOTE — Evaluation (Signed)
Physical Therapy Evaluation Patient Details Name: Zoe [name] MRN: [id number] DOB: 1926-07-16 Today's Date: 07/28/2022  History of Present Illness  Pt is a 89 y.o. female presenting to hospital 07/26/22 (pt felt pop in R hip which then caused her to fall down and hit her head on the door); c/o pain in R leg.  Imaging showing mildly displaced comminuted intertrochanteric fx of R proximal femur; questionable fx of R inferior pubic ramus (may be artifactual), and L subdural hematoma with mass effect on subjacent brain parenchyma (no significant midline shift).  Repeat head CT at 6 hours was stable.  S/p intramedullary fixation for displaced comminuted R intertrochanteric hip fx 07/27/22.  PMH includes DM, htn, anemia, heart block with pacemaker, dementia, cardiac sx, ORIF tibia/fibular R fx 2021.  Clinical Impression  Prior to hospital admission, pt reports being ambulatory (used [facility] outside of home but no AD in home--held onto furniture as needed); lives with her son.  Pt oriented to name, DOB, hospital, and month only; no family present to verify home set-up or PLOF information; pt appearing fairly strong in LE's in general.  Mild R hip/thigh pain during session.  Tolerated LE ex's fairly well with assist as needed.  Currently pt is min to mod assist with bed mobility; min to mod assist with transfers using RW; and min to mod assist to ambulate a few feet bed to recliner with RW use.  Pt pleasant and participatory during session.  Pt would currently benefit from skilled PT to address noted impairments and functional limitations (see below for any additional details).  Upon hospital discharge, pt would benefit from ongoing therapy.    Recommendations for follow up therapy are one component of a multi-disciplinary discharge planning process, led by the attending physician.  Recommendations may be updated based on patient status, additional functional criteria and insurance authorization.  Follow Up  Recommendations Skilled nursing-short term rehab (<3 hours/day) Can patient physically be transported by private vehicle: No    Assistance Recommended at Discharge Frequent or constant Supervision/Assistance  Patient can return home with the following  A lot of help with walking and/or transfers;A lot of help with bathing/dressing/bathroom;Assistance with cooking/housework;Assist for transportation;Help with stairs or ramp for entrance    Equipment Recommendations Rolling walker (2 wheels);BSC/3in1  Recommendations for Other Services  OT consult    Functional Status Assessment Patient has had a recent decline in their functional status and demonstrates the ability to make significant improvements in function in a reasonable and predictable amount of time.     Precautions / Restrictions Precautions Precautions: Fall Precaution Comments: Aspiration Restrictions Weight Bearing Restrictions: Yes RLE Weight Bearing: Weight bearing as tolerated      Mobility  Bed Mobility Overal bed mobility: Needs Assistance Bed Mobility: Supine to Sit     Supine to sit: Min assist, Mod assist, HOB elevated     General bed mobility comments: assist for R LE and trunk; vc's for technique    Transfers Overall transfer level: Needs assistance Equipment used: Rolling walker (2 wheels) Transfers: Sit to/from Stand Sit to Stand: Min assist, Mod assist           General transfer comment: vc's for UE and LE placement; assist to initiate stand and control descent sitting; vc's for overall technique    Ambulation/Gait Ambulation/Gait assistance: Min assist, Mod assist Gait Distance (Feet): 3 Feet (bed to recliner) Assistive device: Rolling walker (2 wheels)   Gait velocity: decreased     General Gait Details: antalgic;  decreased stance time R LE; vc's for walker use and overall technique; step to gait pattern  Stairs            Wheelchair Mobility    Modified Rankin (Stroke  Patients Only)       Balance Overall balance assessment: Needs assistance Sitting-balance support: No upper extremity supported, Feet supported Sitting balance-Leahy Scale: Good Sitting balance - Comments: steady sitting reaching within BOS   Standing balance support: Bilateral upper extremity supported, Reliant on assistive device for balance Standing balance-Leahy Scale: Fair Standing balance comment: steady static standing with B UE support on RW                             Pertinent Vitals/Pain Pain Assessment Pain Assessment: Faces Faces Pain Scale: Hurts a little bit Pain Location: R hip/thigh Pain Descriptors / Indicators: Tender, Sore Pain Intervention(s): Limited activity within patient's tolerance, Monitored during session, Premedicated before session, Repositioned Vitals (HR and O2 on room air) stable and WFL throughout treatment session.    Home Living Family/patient expects to be discharged to:: Private residence Living Arrangements: Children (Pt's son) Available Help at Discharge: Family Type of Home: House Home Access: Stairs to enter Entrance Stairs-Rails: Right;Left;Can reach both (pt reports holding onto R railing to enter home) Technical brewer of Steps: 3-4   Home Layout: One level Home Equipment: [place] - single point Additional Comments: Information obtained from pt (appears consistent with previous documentation)    Prior Function Prior Level of Function : Needs assist             Mobility Comments: Pt reports no AD use in home (holds onto furniture as needed) and uses [facility] outside of home (no family present to verify information).       Hand Dominance        Extremity/Trunk Assessment   Upper Extremity Assessment Upper Extremity Assessment: Overall WFL for tasks assessed;Defer to OT evaluation    Lower Extremity Assessment Lower Extremity Assessment: RLE deficits/detail;Generalized weakness (L LE WFL) RLE Deficits /  Details: at least 3-/5 hip flexion and knee flexion/extension (limited d/t hip/thigh pain); at least 3/5 AROM ankle DF/PF RLE: Unable to fully assess due to pain    Cervical / Trunk Assessment Cervical / Trunk Assessment: Normal  Communication   Communication: HOH  Cognition Arousal/Alertness: Awake/alert Behavior During Therapy: WFL for tasks assessed/performed Overall Cognitive Status: No family/caregiver present to determine baseline cognitive functioning                                 General Comments: Oriented to person, hospital, and [place].  Pt reports being in [place].  Pt did not remember falling or having surgery.        General Comments General comments (skin integrity, edema, etc.): mild drainage noted R thigh honeycomb dressings.  Nursing cleared pt for participation in physical therapy.  Pt agreeable to PT session.    Exercises General Exercises - Lower Extremity Ankle Circles/Pumps: AROM, Strengthening, Both, 10 reps, Supine Short Arc Quad: AROM, Strengthening, Right, 10 reps, Supine Heel Slides: AAROM, Strengthening, Right, 10 reps, Supine Hip ABduction/ADduction: AAROM, Strengthening, Right, 10 reps, Supine Straight Leg Raises: AAROM, Strengthening, Right, 10 reps, Supine   Assessment/Plan    PT Assessment Patient needs continued PT services  PT Problem List Decreased strength;Decreased activity tolerance;Decreased balance;Decreased mobility;Decreased knowledge of use of DME;Decreased knowledge of precautions;Decreased  skin integrity;Pain       PT Treatment Interventions DME instruction;Gait training;Stair training;Functional mobility training;Therapeutic activities;Therapeutic exercise;Balance training;Patient/family education    PT Goals (Current goals can be found in the Care Plan section)  Acute Rehab PT Goals Patient Stated Goal: to improve mobility PT Goal Formulation: With patient Time For Goal Achievement: 08/11/22 Potential to Achieve  Goals: Good    Frequency BID     Co-evaluation               AM-PAC PT "6 Clicks" Mobility  Outcome Measure Help needed turning from your back to your side while in a flat bed without using bedrails?: A Little Help needed moving from lying on your back to sitting on the side of a flat bed without using bedrails?: A Lot Help needed moving to and from a bed to a chair (including a wheelchair)?: A Lot Help needed standing up from a chair using your arms (e.g., wheelchair or bedside chair)?: A Lot Help needed to walk in hospital room?: A Lot Help needed climbing 3-5 steps with a railing? : Total 6 Click Score: 12    End of Session Equipment Utilized During Treatment: Gait belt Activity Tolerance: Patient tolerated treatment well Patient left: in chair;with call bell/phone within reach;with chair alarm set;Other (comment) (SLP present; fall mat in place) Nurse Communication: Mobility status;Precautions;Weight bearing status PT Visit Diagnosis: Other abnormalities of gait and mobility (R26.89);Muscle weakness (generalized) (M62.81);History of falling (Z91.81);Pain Pain - Right/Left: Right Pain - part of body: Hip    Time: 0828-0859 PT Time Calculation (min) (ACUTE ONLY): 31 min   Charges:   PT Evaluation $PT Eval Low Complexity: 1 Low PT Treatments $Therapeutic Exercise: 8-22 mins $Therapeutic Activity: 8-22 mins       Leitha Bleak, PT 07/28/22, 9:26 AM

## 2022-07-29 ENCOUNTER — Encounter: Payer: Self-pay | Admitting: Orthopedic Surgery

## 2022-07-29 LAB — GLUCOSE, CAPILLARY
Glucose-Capillary: 142 mg/dL — ABNORMAL HIGH (ref 70–99)
Glucose-Capillary: 175 mg/dL — ABNORMAL HIGH (ref 70–99)
Glucose-Capillary: 180 mg/dL — ABNORMAL HIGH (ref 70–99)
Glucose-Capillary: 145 mg/dL — ABNORMAL HIGH (ref 70–99)

## 2022-07-29 LAB — HEMOGLOBIN A1C
Hgb A1c MFr Bld: 6.6 % — ABNORMAL HIGH (ref 4.8–5.6)
Mean Plasma Glucose: 143 mg/dL

## 2022-07-29 MED ORDER — ADULT MULTIVITAMIN W/MINERALS CH: 1.0000 | ORAL_TABLET | Freq: Every day | ORAL | Status: AC

## 2022-07-29 MED ORDER — SENNOSIDES-DOCUSATE SODIUM 8.6-50 MG PO TABS: 1.0000 | ORAL_TABLET | Freq: Two times a day (BID) | ORAL | Status: AC

## 2022-07-29 MED ORDER — ACETAMINOPHEN 325 MG PO TABS: 650.0000 mg | ORAL_TABLET | ORAL | Status: AC | PRN

## 2022-07-29 MED ORDER — POLYETHYLENE GLYCOL 3350 17 G PO PACK: 17.0000 g | PACK | Freq: Every day | ORAL | 0 refills | Status: AC | PRN

## 2022-07-29 MED ORDER — ENSURE ENLIVE PO LIQD: 237.0000 mL | Freq: Two times a day (BID) | ORAL | 12 refills | Status: AC

## 2022-07-29 MED ORDER — METHOCARBAMOL 500 MG PO TABS: 500.0000 mg | ORAL_TABLET | Freq: Four times a day (QID) | ORAL | Status: AC | PRN

## 2022-07-29 MED ORDER — HYDROCODONE-ACETAMINOPHEN 5-325 MG PO TABS: 1.0000 | ORAL_TABLET | ORAL | 0 refills | Status: AC | PRN

## 2022-07-29 MED ORDER — ONDANSETRON HCL 4 MG PO TABS: 4.0000 mg | ORAL_TABLET | Freq: Four times a day (QID) | ORAL | 0 refills | Status: AC | PRN

## 2022-07-29 MED ORDER — TRAMADOL HCL 50 MG PO TABS: 50.0000 mg | ORAL_TABLET | Freq: Four times a day (QID) | ORAL | 0 refills | Status: AC

## 2022-07-29 MED ORDER — ENOXAPARIN SODIUM 40 MG/0.4ML IJ SOSY: 40.0000 mg | PREFILLED_SYRINGE | INTRAMUSCULAR | Status: AC

## 2022-07-29 MED ORDER — TRAZODONE HCL 50 MG PO TABS: 25.0000 mg | ORAL_TABLET | Freq: Every evening | ORAL | Status: AC | PRN

## 2022-07-29 MED ORDER — AMLODIPINE BESYLATE 2.5 MG PO TABS: 2.5000 mg | ORAL_TABLET | Freq: Every day | ORAL | Status: AC

## 2022-07-29 MED ORDER — ALUM & MAG HYDROXIDE-SIMETH 200-200-20 MG/5ML PO SUSP: 30.0000 mL | ORAL | 0 refills | Status: AC | PRN

## 2022-07-29 NOTE — TOC Progression Note (Signed)
Transition of Care [facility]) - Progression Note    Patient Details  Name: Zoe [name] MRN: [id number] Date of Birth: 1926/12/17  Transition of Care [facility]) CM/SW [place], RN Phone Number: 07/29/2022, 2:14 PM  Clinical Narrative:   [facility] SNF authorization approved. SNF authorization approval number is [id number].  I have informed Zoe [name] with [facility] of SNF approval.  Zoe [name] will have a bed for patient today.  Zoe [name] reports that room number is 710 and the number to call report is [phone] and ask to speak to the [place].    I have also spoken with patient's son and made him aware of the above information.  Zoe [name] reports that he is in a wheelchair and will need speak with facility to provide and alternate way to  sign admission paperwork.  I have asked Zoe [name] to follow up with Zoe. Lanny [name] about the admission paperwork.     Expected Discharge Plan: [place] Barriers to Discharge: Continued Medical Work up  Expected Discharge Plan and Services     Post Acute Care Choice: [place] Living arrangements for the past 2 months: Single Family Home Expected Discharge Date: 07/29/22                 DME Agency: NA                   Social Determinants of Health (SDOH) Interventions SDOH Screenings   Tobacco Use: Medium Risk (07/29/2022)    Readmission Risk Interventions     No data to display

## 2022-07-29 NOTE — TOC Progression Note (Signed)
Transition of Care [facility]) - Progression Note    Patient Details  Name: Zoe [name] MRN: [id number] Date of Birth: 1926/09/06  Transition of Care [facility]) CM/SW [place], RN Phone Number: 07/29/2022, 11:26 AM  Clinical Narrative:   I have spoken with patient's son Marcello Moores.  I have informed him of SNF bed offers.  [facility] has made SNF bed offers. Mr. Marcello Moores as accepted bed offer at [facility].  I have informed Tammy, admission coordinator at [facility] that patient's son has accepted bed offer.    [facility] insurance authorization submitted for SNF placement.   SNF authorization pending at this time.  Pending Authorization number is [id number].   TOC will continue to follow for discharge planning.      Expected Discharge Plan: [place] Barriers to Discharge: Continued Medical Work up  Expected Discharge Plan and [place] Choice: [place] arrangements for the past 2 months: [place]                   DME Agency: NA                   Social Determinants of Health (SDOH) Interventions SDOH Screenings   Tobacco Use: Medium Risk (07/29/2022)    Readmission Risk Interventions     No data to display

## 2022-07-29 NOTE — TOC Progression Note (Signed)
Transition of Care [facility]) - Progression Note    Patient Details  Name: LIANNY OLLAR MRN: [id number] Date of Birth: [birth year]  Transition of Care [facility]) CM/SW [place], RN Phone Number: 07/29/2022, 4:17 PM  Clinical Narrative:   I have received message from staff nurse that they are unable to accept patient until she has a BM.  Patient has not had a BM in 4 days.  Staff nurse Ovid Curd informs me that a patient has been given a dulcolax to help with a BM.   I have spoken with Tammy, Admission Coordinator with [facility].  She reports that patient is able to come to the facility today as if she is able to have a BM.    I have informed Ovid Curd that patient is still able to admit facility if she is able to have a BM today.  I have asked staff nurse to call EMS once patient has a BM.  I have cancelled EMS transport at this time.  I have given staff nurse EMS contact number.  I have informed staff nurse to provide EMS patient's name,  DOB room number, age and transport location.       Expected Discharge Plan: [place] Barriers to Discharge: Continued Medical Work up  Expected Discharge Plan and Services     Post Acute Care Choice: [place] Living arrangements for the past 2 months: Single Family Home Expected Discharge Date: 07/29/22                 DME Agency: NA                   Social Determinants of Health (SDOH) Interventions SDOH Screenings   Tobacco Use: Medium Risk (07/29/2022)    Readmission Risk Interventions     No data to display

## 2022-07-29 NOTE — Progress Notes (Signed)
Physical Therapy Treatment Patient Details Name: Zoe [name] MRN: [id number] DOB: [birth year] Today's Date: 07/29/2022   History of Present Illness Pt is a 89 y.o. female presenting to hospital 07/26/22 (pt felt pop in R hip which then caused her to fall down and hit her head on the door); c/o pain in R leg.  Imaging showing mildly displaced comminuted intertrochanteric fx of R proximal femur; questionable fx of R inferior pubic ramus (may be artifactual), and L subdural hematoma with mass effect on subjacent brain parenchyma (no significant midline shift).  Repeat head CT at 6 hours was stable.  S/p intramedullary fixation for displaced comminuted R intertrochanteric hip fx 07/27/22.  PMH includes DM, htn, anemia, heart block with pacemaker, dementia, cardiac sx, ORIF tibia/fibular R fx 2021.    PT Comments    Pt resting in recliner upon PT arrival; agreeable to therapy.  Pt reporting minimal R hip/thigh pain/discomfort at rest but increased R hip/thigh pain noted with activity.  Pt tolerated sitting LE ex's fairly well with assist as needed.  During session pt mod to max assist to stand from recliner using RW; mod to max assist to perform stand step turn recliner to bed with RW use (antalgic gait noted but able to take small steps); and mod to max assist to lay down in bed end of session.  Pt appearing comfortable in bed end of session.  Will continue to focus on strengthening, balance, and progressive functional mobility during hospitalization.   Recommendations for follow up therapy are one component of a multi-disciplinary discharge planning process, led by the attending physician.  Recommendations may be updated based on patient status, additional functional criteria and insurance authorization.  Follow Up Recommendations  Skilled nursing-short term rehab (<3 hours/day) Can patient physically be transported by private vehicle: No   Assistance Recommended at Discharge Frequent or constant  Supervision/Assistance  Patient can return home with the following A lot of help with walking and/or transfers;A lot of help with bathing/dressing/bathroom;Assistance with cooking/housework;Assist for transportation;Help with stairs or ramp for entrance   Equipment Recommendations  Rolling walker (2 wheels);BSC/3in1;Wheelchair (measurements PT);Wheelchair cushion (measurements PT)    Recommendations for Other Services OT consult     Precautions / Restrictions Precautions Precautions: Fall Precaution Comments: Aspiration Restrictions Weight Bearing Restrictions: Yes RLE Weight Bearing: Weight bearing as tolerated     Mobility  Bed Mobility Overal bed mobility: Needs Assistance Bed Mobility: Sit to Supine     Sit to supine: Mod assist, Max assist   General bed mobility comments: assist for RLE and trunk; verbal cues for technique    Transfers Overall transfer level: Needs assistance Equipment used: Rolling walker (2 wheels) Transfers: Sit to/from Stand, Bed to chair/wheelchair/BSC Sit to Stand: Mod assist, Max assist   Step pivot transfers: Mod assist, Max assist       General transfer comment: vc's for UE/LE placement and overall technique for transfers; pt antalgic but able to take small steps recliner to bed with RW and assist    Ambulation/Gait Ambulation/Gait assistance:  (pt unable to progress to ambulation d/t R hip/thigh pain)                 Stairs             Wheelchair Mobility    Modified Rankin (Stroke Patients Only)       Balance Overall balance assessment: Needs assistance Sitting-balance support: No upper extremity supported, Feet supported Sitting balance-Leahy Scale: Good Sitting balance - Comments: steady  sitting reaching within BOS   Standing balance support: Bilateral upper extremity supported, Reliant on assistive device for balance Standing balance-Leahy Scale: Poor Standing balance comment: intermittent assist for  balance in standing using RW (mild posterior lean noted)                            Cognition Arousal/Alertness: Awake/alert Behavior During Therapy: WFL for tasks assessed/performed Overall Cognitive Status: No family/caregiver present to determine baseline cognitive functioning                                 General Comments: Oriented to person, place, R thigh broken bone (did not know she had surgery).        Exercises General Exercises - Lower Extremity Long Arc Quad: AROM, Strengthening, Both, 10 reps, Seated Hip Flexion/Marching: AROM, Left, AAROM, Right, Strengthening, 10 reps, Seated    General Comments  Pt agreeable to PT session.      Pertinent Vitals/Pain Pain Assessment Pain Assessment: Faces Faces Pain Scale: Hurts a little bit (2/10 at rest; 8/10 with activity) Pain Location: R hip/thigh Pain Descriptors / Indicators: Tender, Sore Pain Intervention(s): Limited activity within patient's tolerance, Monitored during session, Repositioned Vitals (HR and O2 on room air) stable and WFL throughout treatment session.    Home Living                          Prior Function            PT Goals (current goals can now be found in the care plan section) Acute Rehab PT Goals Patient Stated Goal: to improve mobility PT Goal Formulation: With patient Time For Goal Achievement: 08/11/22 Potential to Achieve Goals: Fair Progress towards PT goals: Progressing toward goals    Frequency    BID      PT Plan Current plan remains appropriate    Co-evaluation              AM-PAC PT "6 Clicks" Mobility   Outcome Measure  Help needed turning from your back to your side while in a flat bed without using bedrails?: A Little Help needed moving from lying on your back to sitting on the side of a flat bed without using bedrails?: A Lot Help needed moving to and from a bed to a chair (including a wheelchair)?: A Lot Help needed  standing up from a chair using your arms (e.g., wheelchair or bedside chair)?: A Lot Help needed to walk in hospital room?: Total Help needed climbing 3-5 steps with a railing? : Total 6 Click Score: 11    End of Session Equipment Utilized During Treatment: Gait belt Activity Tolerance: Patient limited by pain Patient left: Other (comment);in bed;with call bell/phone within reach;with bed alarm set (fall mat in place) Nurse Communication: Mobility status;Precautions;Weight bearing status PT Visit Diagnosis: Other abnormalities of gait and mobility (R26.89);Muscle weakness (generalized) (M62.81);History of falling (Z91.81);Pain Pain - Right/Left: Right Pain - part of body: Hip     Time: [id number] PT Time Calculation (min) (ACUTE ONLY): 23 min  Charges:  $Therapeutic Exercise: 8-22 mins $Therapeutic Activity: 8-22 mins                     Leitha Bleak, PT 07/29/22, 4:07 PM

## 2022-07-29 NOTE — Progress Notes (Signed)
Subjective:  POD #2 s/p intramedullary fixation for right intertrochanteric hip fracture.   Patient is sitting up in a chair eating lunch.  Patient has dementia but does not report any significant right hip pain.    Objective:   VITALS:   Vitals:   07/28/22 1256 07/28/22 1539 07/28/22 2353 07/29/22 0804  BP: 120/60 134/67 (!) 114/54 117/67  Pulse: 84 86 80 84  Resp: '18 18 18 17  '$ Temp: 98.7 F (37.1 C) 97.7 F (36.5 C) 98.5 F (36.9 C) 98.6 F (37 C)  TempSrc:      SpO2: 92% 96% 97% 99%  Weight:      Height:        PHYSICAL EXAM: Right lower extremity Neurovascular intact Sensation intact distally Intact pulses distally Dorsiflexion/Plantar flexion intact Incision: dressing C/D/I No cellulitis present Compartment soft  LABS  Results for orders placed or performed during the hospital encounter of 07/26/22 (from the past 24 hour(s))  Glucose, capillary     Status: Abnormal   Collection Time: 07/28/22  4:56 PM  Result Value Ref Range   Glucose-Capillary 166 (H) 70 - 99 mg/dL  Glucose, capillary     Status: Abnormal   Collection Time: 07/28/22  9:05 PM  Result Value Ref Range   Glucose-Capillary 191 (H) 70 - 99 mg/dL  Glucose, capillary     Status: Abnormal   Collection Time: 07/29/22  8:01 AM  Result Value Ref Range   Glucose-Capillary 142 (H) 70 - 99 mg/dL  Glucose, capillary     Status: Abnormal   Collection Time: 07/29/22 12:21 PM  Result Value Ref Range   Glucose-Capillary 175 (H) 70 - 99 mg/dL    DG FEMUR PORT, MIN 2 VIEWS RIGHT  Result Date: 07/27/2022 CLINICAL DATA:  Status post right femoral nail placement EXAM: RIGHT FEMUR PORTABLE 2 VIEW COMPARISON:  Right hip radiograph dated 07/26/2022 FINDINGS: Status post right femoral fixation with intramedullary rod placement. Hardware appears intact. Alignment is anatomic. Fracture lucency through the intertrochanteric right femur is visible. Subcutaneous emphysema and overlying surgical staples, postsurgical.  Questionable fracture of the right inferior pubic ramus, which may be artifactual. Prior right knee arthroplasty. Old healed fracture of the proximal fibula. IMPRESSION: 1. Status post right intertrochanteric fracture fixation with intramedullary rod placement. Alignment is anatomic. 2. Questionable fracture of the right inferior pubic ramus, which may be artifactual. Electronically Signed   By: Darrin Nipper M.D.   On: 07/27/2022 17:14   DG HIP UNILAT WITH PELVIS 2-3 VIEWS RIGHT  Result Date: 07/27/2022 CLINICAL DATA:  Fracture. EXAM: DG HIP (WITH OR WITHOUT PELVIS) 2-3V RIGHT COMPARISON:  Preoperative imaging. FINDINGS: Six fluoroscopic spot views of the right hip obtained in the operating room. Intramedullary nail with trans trochanteric and distal locking screw fixation traverse proximal femur fracture. Fluoroscopy time 100.9 seconds. Dose not provided. IMPRESSION: Fluoroscopic spot views during right hip fracture ORIF. Electronically Signed   By: Keith Rake M.D.   On: 07/27/2022 16:18   DG C-Arm 1-60 Min-No Report  Result Date: 07/27/2022 Fluoroscopy was utilized by the requesting physician.  No radiographic interpretation.    Assessment/Plan: 2 Days Post-Op   Principal Problem:   Closed right hip fracture (HCC) Active Problems:   Hypothyroidism   Essential hypertension   Dementia without behavioral disturbance ([place])   Controlled type 2 diabetes mellitus without complication, without long-term current use of insulin (HCC)   Subdural hematoma (HCC)  Patient is stable postop. Her vital signs are stable. Patient will continue  physical and Occupational Therapy. Patient will need a skilled nursing facility upon discharge. Patient ordered for Lovenox for DVT prophylaxis.      [place] , MD 07/29/2022, 2:19 PM

## 2022-07-29 NOTE — TOC Progression Note (Signed)
Transition of Care [facility]) - Progression Note    Patient Details  Name: Zoe [name] MRN: [id number] Date of Birth: [birth year]  Transition of Care [facility]) CM/SW [place], RN Phone Number: 07/29/2022, 2:33 PM  Clinical Narrative:   EMS transport arranged via [facility].  I have made staff nurse aware.    [facility] SNF approval number is [id number] next review date is 07-31-2022.    Expected Discharge Plan: [place] Barriers to Discharge: Continued Medical Work up  Expected Discharge Plan and Services     Post Acute Care Choice: [place] Living arrangements for the past 2 months: Single Family Home Expected Discharge Date: 07/29/22                 DME Agency: NA                   Social Determinants of Health (SDOH) Interventions SDOH Screenings   Tobacco Use: Medium Risk (07/29/2022)    Readmission Risk Interventions     No data to display

## 2022-07-29 NOTE — Progress Notes (Addendum)
RN called report to [facility] nursing station.  Logan took report and informed RN that pt MUST have a BM prior to admission to facility as the pt had not had a BM since 7Mar.  RN administered dulcolax suppository after pt admitted she felt no urge to defecate.  Coordinating staff and EMS were notified of delay in D/C  Addendum: Pt has had a BM at 1715.  EMS notified of pending D/C.

## 2022-07-29 NOTE — Progress Notes (Signed)
Physical Therapy Treatment Patient Details Name: Zoe [name] MRN: [id number] DOB: 05-21-1926 Today's Date: 07/29/2022   History of Present Illness Pt is a 89 y.o. female presenting to hospital 07/26/22 (pt felt pop in R hip which then caused her to fall down and hit her head on the door); c/o pain in R leg.  Imaging showing mildly displaced comminuted intertrochanteric fx of R proximal femur; questionable fx of R inferior pubic ramus (may be artifactual), and L subdural hematoma with mass effect on subjacent brain parenchyma (no significant midline shift).  Repeat head CT at 6 hours was stable.  S/p intramedullary fixation for displaced comminuted R intertrochanteric hip fx 07/27/22.  PMH includes DM, htn, anemia, heart block with pacemaker, dementia, cardiac sx, ORIF tibia/fibular R fx 2021.    PT Comments    Pt resting in bed upon PT arrival; just finished with bath with nursing assist; agreeable to therapy.  R hip/thigh pain 2/10 at rest but 6-8/10 with activity (pt pre-medicated with pain meds).  Tolerated LE ex's in bed fairly well with assist as needed.  During session pt mod to max assist semi-supine to sitting edge of bed; min to mod assist to stand from bed up to RW (min assist for initial standing balance d/t posterior lean); initial min to mod assist to take a couple steps with RW towards recliner but pt appearing very antalgic so pt cued to pivot on L LE instead which pt initially did fairly well but then pt suddenly sat down (prior to reaching chair; therapist giving pt max cues NOT to sit down) requiring max assist of therapist to safely assist pt into recliner.  Nurse updated on pt's status.  Will continue to focus on strengthening and progressive functional mobility during hospitalization.   Recommendations for follow up therapy are one component of a multi-disciplinary discharge planning process, led by the attending physician.  Recommendations may be updated based on patient status,  additional functional criteria and insurance authorization.  Follow Up Recommendations  Skilled nursing-short term rehab (<3 hours/day) Can patient physically be transported by private vehicle: No   Assistance Recommended at Discharge Frequent or constant Supervision/Assistance  Patient can return home with the following A lot of help with walking and/or transfers;A lot of help with bathing/dressing/bathroom;Assistance with cooking/housework;Assist for transportation;Help with stairs or ramp for entrance   Equipment Recommendations  Rolling walker (2 wheels);BSC/3in1;Wheelchair (measurements PT);Wheelchair cushion (measurements PT)    Recommendations for Other Services OT consult     Precautions / Restrictions Precautions Precautions: Fall Precaution Comments: Aspiration Restrictions Weight Bearing Restrictions: Yes RLE Weight Bearing: Weight bearing as tolerated     Mobility  Bed Mobility Overal bed mobility: Needs Assistance Bed Mobility: Supine to Sit     Supine to sit: Mod assist, Max assist, HOB elevated     General bed mobility comments: assist for RLE and trunk; verbal cues    Transfers Overall transfer level: Needs assistance Equipment used: Rolling walker (2 wheels) Transfers: Sit to/from Stand, Bed to chair/wheelchair/BSC Sit to Stand: Mod assist   Step pivot transfers: Min assist, Mod assist, Max assist       General transfer comment: vc's for technique; pt able to take a couple steps towards recliner using RW (very antalgic) but then pt started to pivot on L LE and then pt just started to sit even though therapist repetitively asking pt to stay standing (max assist to sit pt in recliner)    Ambulation/Gait Ambulation/Gait assistance:  (pt unable to  ambulate at this time (see transfers above))                 Stairs             Wheelchair Mobility    Modified Rankin (Stroke Patients Only)       Balance Overall balance assessment:  Needs assistance Sitting-balance support: No upper extremity supported, Feet supported Sitting balance-Leahy Scale: Good Sitting balance - Comments: steady sitting reaching within BOS   Standing balance support: Bilateral upper extremity supported, Reliant on assistive device for balance Standing balance-Leahy Scale: Poor Standing balance comment: intermittent assist for balance in standing using RW (mild posterior lean noted)                            Cognition Arousal/Alertness: Awake/alert Behavior During Therapy: WFL for tasks assessed/performed Overall Cognitive Status: No family/caregiver present to determine baseline cognitive functioning                                 General Comments: Oriented to person, place, R thigh broken bone (did not know she had surgery).        Exercises General Exercises - Lower Extremity Ankle Circles/Pumps: AROM, Strengthening, Both, 10 reps, Supine Short Arc Quad: AROM, Strengthening, Right, 10 reps, Supine Heel Slides: AAROM, Strengthening, Right, 10 reps, Supine Hip ABduction/ADduction: AAROM, Strengthening, Right, 10 reps, Supine Straight Leg Raises: AAROM, Strengthening, Right, 10 reps, Supine    General Comments  Nursing cleared pt for participation in physical therapy.  Pt agreeable to PT session.      Pertinent Vitals/Pain Pain Assessment Pain Assessment: Faces Faces Pain Scale: Hurts a little bit Pain Location: R hip/thigh Pain Descriptors / Indicators: Tender, Sore Pain Intervention(s): Limited activity within patient's tolerance, Monitored during session, Premedicated before session, Repositioned (nurse notified) Vitals (HR and O2 on room air) stable and WFL throughout treatment session.    Home Living                          Prior Function            PT Goals (current goals can now be found in the care plan section) Acute Rehab PT Goals Patient Stated Goal: to improve mobility PT  Goal Formulation: With patient Time For Goal Achievement: 08/11/22 Potential to Achieve Goals: Fair Progress towards PT goals: Progressing toward goals    Frequency    BID      PT Plan Current plan remains appropriate    Co-evaluation              AM-PAC PT "6 Clicks" Mobility   Outcome Measure  Help needed turning from your back to your side while in a flat bed without using bedrails?: A Little Help needed moving from lying on your back to sitting on the side of a flat bed without using bedrails?: A Lot Help needed moving to and from a bed to a chair (including a wheelchair)?: A Lot Help needed standing up from a chair using your arms (e.g., wheelchair or bedside chair)?: A Lot Help needed to walk in hospital room?: Total Help needed climbing 3-5 steps with a railing? : Total 6 Click Score: 11    End of Session Equipment Utilized During Treatment: Gait belt Activity Tolerance: Patient limited by pain Patient left: in chair;with call bell/phone within reach;with  chair alarm set;Other (comment) (fall mat in place) Nurse Communication: Mobility status;Precautions;Weight bearing status PT Visit Diagnosis: Other abnormalities of gait and mobility (R26.89);Muscle weakness (generalized) (M62.81);History of falling (Z91.81);Pain Pain - Right/Left: Right Pain - part of body: Hip     Time: [id number] PT Time Calculation (min) (ACUTE ONLY): 23 min  Charges:  $Therapeutic Exercise: 8-22 mins $Therapeutic Activity: 8-22 mins                     Leitha Bleak, PT 07/29/22, 12:21 PM

## 2022-07-29 NOTE — Care Management Important Message (Signed)
Important Message  Patient Details  Name: Zoe [name] MRN: [id number] Date of Birth: 07-23-1926   Medicare Important Message Given:  N/A - LOS <3 / Initial given by admissions     Dannette Barbara 07/29/2022, 9:03 AM

## 2022-07-29 NOTE — Discharge Summary (Addendum)
Physician Discharge Summary   Patient: Zoe [name] MRN: [id number] DOB: [birth year]  Admit date:     07/26/2022  Discharge date: 07/29/22  Discharge Physician: Ezekiel Slocumb   PCP: Pcp, No   Recommendations at discharge:   Follow up at [facility] in 10-14 days Follow up with Primary Care in 1-2 weeks Repeat CBC, BMP in 1-2 weeks Continue Lovenox x 2 weeks for DVT prophylaxis  Discharge Diagnoses: Principal Problem:   Closed right hip fracture ([place]) Active Problems:   Subdural hematoma ([place])   Hypothyroidism   Essential hypertension   Controlled type 2 diabetes mellitus without complication, without long-term current use of insulin (HCC)   Dementia without behavioral disturbance ([place])  Resolved Problems:   * No resolved hospital problems. *  Hospital Course: Zoe [name] is a 89 y.o. female with medical history significant for type 2 diabetes mellitus, hypertension, hypothyroidism dementia and osteoarthritis and pacemaker placement, who presented to the ER on 07/26/2022 for evaluation after having a fall.  Patient reportedly lives with a family member and reports feeling a pop in her right hip that then caused her to fall down.  She did not have any head injury but admitted to hitting her head on the door.  She was having right lower extremity pain.  No headache or dizziness or blurred vision.  She denied any nausea or vomiting then vomited once in the emergency room  In the ED, patient was round to have a mildly displaced comminuted intertrochanteric right proximal femur fracture on x-ray of Right hip. CT head showed a mixed density left subdural hematoma has both acute and chronic components.  measures up to 17 mm in thickness. There is underlying mass effect on the subjacent brain parenchyma. No significant midline shift C-spine CT showed no acute fracture or traumatic subluxation. Chest xray showed a RLL focal opacity felt to be atelectasis, pna not excluded and  follow up xray in 4-6 weeks recommended to ensure resolution.   Admitted to medicine service in stepdown unit for close neurological monitoring.  Neurosurgery was consulted.  Head CT was repeated and subdural hematoma was stable.  Orthopedic surgery consulted and plan for surgical repair of the hip fracture.  3/9 -- stable neurologically, transferred to medical floor.   3/10 -- stable 3/11 -- remains stable for discharge to rehab today.    Assessment and Plan: * Closed right hip fracture ([place]) Ortho consulted  Underwent IM nail fixation on 3/9/ PT/OT recommending SNF Pain control PRN Bowel regimen Lovenox for DVT prophylaxis  Activity restrictions per ortho  Subdural hematoma [facility]) Neurosurgery consulted. Repeat head CT at 6 hours was stable  --No neurosurgical intervention at this time --Cleared for hip surgery from neuro standpoint --Okay to  start DVT ppx post-op --If therapeutic dose anticoagulation were to become indicated, reach back out to neurosurgery --No anti-seizure meds recommended due to advanced and and risk of polypharmacy and adverse side effects  Essential hypertension Continue home low dose Norvasc  PRN IV labetalol while NPO  Hypothyroidism Continue Synthroid   Controlled type 2 diabetes mellitus without complication, without long-term current use of insulin (HCC) On metformin and Actos in the past. Cover with sliding scale Novolog during admission Adjust for CBG goal 140-180  Dementia without behavioral disturbance (HCC) No current behavioral changes. Appears no longer taking Aricept. Delirium precautions         Consultants: Orthopedic surgery Procedures performed: IM nail fixation of intertrochanteric fracture   Disposition: Skilled nursing facility  Diet recommendation:  Discharge Diet Orders (From admission, onward)     Start     Ordered   07/29/22 0000  Diet - low sodium heart healthy        07/29/22 1410            Cardiac diet   DISCHARGE MEDICATION: Allergies as of 07/29/2022   No Known Allergies      Medication List     TAKE these medications    acetaminophen 325 MG tablet Commonly known as: TYLENOL Take 2 tablets (650 mg total) by mouth every 4 (four) hours as needed for mild pain, moderate pain, fever or headache.   alum & mag hydroxide-simeth 200-200-20 MG/5ML suspension Commonly known as: MAALOX/MYLANTA Take 30 mLs by mouth every 4 (four) hours as needed for indigestion.   amLODipine 2.5 MG tablet Commonly known as: NORVASC Take 1 tablet (2.5 mg total) by mouth daily. Start taking on: July 30, 2022   enoxaparin 40 MG/0.4ML injection Commonly known as: LOVENOX Inject 0.4 mLs (40 mg total) into the skin daily for 14 days. Start taking on: July 30, 2022   feeding supplement Liqd Take 237 mLs by mouth 2 (two) times daily between meals.   HYDROcodone-acetaminophen 5-325 MG tablet Commonly known as: NORCO/VICODIN Take 1-2 tablets by mouth every 4 (four) hours as needed for moderate pain (pain score 4-6).   levothyroxine 88 MCG tablet Commonly known as: SYNTHROID Take 1 tablet (88 mcg total) by mouth daily at 6 (six) AM.   methocarbamol 500 MG tablet Commonly known as: ROBAXIN Take 1 tablet (500 mg total) by mouth every 6 (six) hours as needed for muscle spasms.   multivitamin with minerals Tabs tablet Take 1 tablet by mouth daily. Start taking on: July 30, 2022   ondansetron 4 MG tablet Commonly known as: ZOFRAN Take 1 tablet (4 mg total) by mouth every 6 (six) hours as needed for nausea.   polyethylene glycol 17 g packet Commonly known as: MIRALAX / GLYCOLAX Take 17 g by mouth daily as needed for mild constipation. Hold if loose or frequent stools   senna-docusate 8.6-50 MG tablet Commonly known as: Senokot-S Take 1 tablet by mouth 2 (two) times daily. Hold if loose or frequent stools   traMADol 50 MG tablet Commonly known as: ULTRAM Take 1 tablet (50 mg  total) by mouth every 6 (six) hours for 8 days.   traZODone 50 MG tablet Commonly known as: DESYREL Take 0.5 tablets (25 mg total) by mouth at bedtime as needed for sleep.               Discharge Care Instructions  (From admission, onward)           Start     Ordered   07/29/22 0000  Leave dressing on - Keep it clean, dry, and intact until clinic visit        07/29/22 1410            Contact information for follow-up providers     Thornton Park, MD. Schedule an appointment as soon as possible for a visit.   Specialty: Orthopedic Surgery Why: Follow up in 10-14 days Contact information: [address] [place] [address] [phone]              Contact information for after-discharge care     [facility] Preferred SNF .   Service: Skilled Chiropodist information: [place] [phone]                    Discharge Exam: Filed Weights   07/26/22 2133 07/27/22 0422  Weight: 83.9 kg 73.2 kg   General exam: awake, alert, no acute distress, pleasantly confused HEENT: moist mucus membranes, hearing grossly normal  Respiratory system: CTAB, no wheezes, rales or rhonchi, normal respiratory effort. Cardiovascular system: normal S1/S2, RRR Gastrointestinal system: soft, NT, ND Central nervous system: A&O x 2. no gross focal neurologic deficits, normal speech Extremities: no edema, normal tone Skin: dry, intact, normal temperature Psychiatry: normal mood, congruent affect, judgement and insight appear normal   Condition at discharge: stable  The results of significant diagnostics from this hospitalization (including imaging, microbiology, ancillary and laboratory) are listed below for reference.   Imaging Studies: DG FEMUR PORT, MIN 2 VIEWS RIGHT  Result Date: 07/27/2022 CLINICAL DATA:  Status post right femoral nail placement EXAM: RIGHT FEMUR  PORTABLE 2 VIEW COMPARISON:  Right hip radiograph dated 07/26/2022 FINDINGS: Status post right femoral fixation with intramedullary rod placement. Hardware appears intact. Alignment is anatomic. Fracture lucency through the intertrochanteric right femur is visible. Subcutaneous emphysema and overlying surgical staples, postsurgical. Questionable fracture of the right inferior pubic ramus, which may be artifactual. Prior right knee arthroplasty. Old healed fracture of the proximal fibula. IMPRESSION: 1. Status post right intertrochanteric fracture fixation with intramedullary rod placement. Alignment is anatomic. 2. Questionable fracture of the right inferior pubic ramus, which may be artifactual. Electronically Signed   By: Darrin Nipper M.D.   On: 07/27/2022 17:14   DG HIP UNILAT WITH PELVIS 2-3 VIEWS RIGHT  Result Date: 07/27/2022 CLINICAL DATA:  Fracture. EXAM: DG HIP (WITH OR WITHOUT PELVIS) 2-3V RIGHT COMPARISON:  Preoperative imaging. FINDINGS: Six fluoroscopic spot views of the right hip obtained in the operating room. Intramedullary nail with trans trochanteric and distal locking screw fixation traverse proximal femur fracture. Fluoroscopy time 100.9 seconds. Dose not provided. IMPRESSION: Fluoroscopic spot views during right hip fracture ORIF. Electronically Signed   By: Keith Rake M.D.   On: 07/27/2022 16:18   DG C-Arm 1-60 Min-No Report  Result Date: 07/27/2022 Fluoroscopy was utilized by the requesting physician.  No radiographic interpretation.   CT HEAD WO CONTRAST (5MM)  Result Date: 07/27/2022 CLINICAL DATA:  89 year old female with mixed density left side subdural hematoma, recent trauma. EXAM: CT HEAD WITHOUT CONTRAST TECHNIQUE: Contiguous axial images were obtained from the base of the skull through the vertex without intravenous contrast. RADIATION DOSE REDUCTION: This exam was performed according to the departmental dose-optimization program which includes automated exposure control,  adjustment of the mA and/or kV according to patient size and/or use of iterative reconstruction technique. COMPARISON:  Head CT 07/26/2022. FINDINGS: Brain: Mixed density left subdural hematoma with layering hematocrit level (series 3, image 22) and a mildly biconvex configuration, mildly loculated. Size and thickness stable, up to 17 mm. Mass effect on the left hemisphere, and mild mass effect on the left lateral ventricle. But no significant midline shift (coronal image 31). Basilar cisterns remain normal. No new intracranial hemorrhage. Stable ventricle size and configuration. Stable gray-white matter differentiation throughout the brain. No cortically based acute infarct identified. Vascular: Calcified atherosclerosis at the skull base. No suspicious intracranial vascular hyperdensity. Skull: Stable and intact. Sinuses/Orbits: Visualized paranasal sinuses and mastoids are clear. Other: Postoperative changes to both globes. No acute orbit or scalp soft tissue finding. IMPRESSION: 1. Stable mixed density and mildly loculated appearing left subdural hematoma,  up to 17 mm thick. Some mass effect on the left hemisphere, but no significant midline shift. 2. No new intracranial abnormality. Electronically Signed   By: Genevie Ann M.D.   On: 07/27/2022 05:15   CT HEAD WO CONTRAST (5MM)  Result Date: 07/26/2022 CLINICAL DATA:  Head trauma, minor (Age >= 65y) EXAM: CT HEAD WITHOUT CONTRAST TECHNIQUE: Contiguous axial images were obtained from the base of the skull through the vertex without intravenous contrast. RADIATION DOSE REDUCTION: This exam was performed according to the departmental dose-optimization program which includes automated exposure control, adjustment of the mA and/or kV according to patient size and/or use of iterative reconstruction technique. COMPARISON:  Head CT 05/01/2021 FINDINGS: Brain: Mixed density left subdural hematoma primarily overlying temporal and frontoparietal regions has both acute and  chronic components. This measures up to 17 mm in thickness, series 508, image 40. There is underlying mass effect on the subjacent brain parenchyma. No significant midline shift. Generalized atrophy and chronic small vessel ischemia. Remote left cerebellar lacunar infarct again seen. There is no evidence of acute ischemia. Vascular: Atherosclerosis of skullbase vasculature without hyperdense vessel or abnormal calcification. Skull: No fracture or focal lesion. Sinuses/Orbits: No acute findings. Bilateral cataract resection. Other: None. IMPRESSION: 1. Mixed density left subdural hematoma has both acute and chronic components. This measures up to 17 mm in thickness. There is underlying mass effect on the subjacent brain parenchyma. No significant midline shift. 2. Generalized atrophy and chronic small vessel ischemia. Remote left cerebellar lacunar infarct. Critical Value/emergent results were called by telephone at the time of interpretation on 07/26/2022 at 11:19 pm to provider [facility] , who verbally acknowledged these results. Electronically Signed   By: Keith Rake M.D.   On: 07/26/2022 23:19   CT Cervical Spine Wo Contrast  Result Date: 07/26/2022 CLINICAL DATA:  Trauma EXAM: CT CERVICAL SPINE WITHOUT CONTRAST TECHNIQUE: Multidetector CT imaging of the cervical spine was performed without intravenous contrast. Multiplanar CT image reconstructions were also generated. RADIATION DOSE REDUCTION: This exam was performed according to the departmental dose-optimization program which includes automated exposure control, adjustment of the mA and/or kV according to patient size and/or use of iterative reconstruction technique. COMPARISON:  Cervical spine CT 05/01/2021 FINDINGS: Alignment: Normal. Skull base and vertebrae: No acute fracture. No primary bone lesion or focal pathologic process. Soft tissues and spinal canal: No prevertebral fluid or swelling. No visible canal hematoma. Disc levels: Disc bulge at  C5-C6 causes mild central canal stenosis. Otherwise, no significant central canal or neural foraminal stenosis. There are mild degenerative changes of disc spaces throughout the cervical spine. There also mild degenerative facet changes throughout the cervical spine. Upper chest: Negative. Other: None. IMPRESSION: No acute fracture or traumatic subluxation of the cervical spine. Electronically Signed   By: Ronney Asters M.D.   On: 07/26/2022 23:04   DG Chest Portable 1 View  Result Date: 07/26/2022 CLINICAL DATA:  Mechanical fall EXAM: PORTABLE CHEST 1 VIEW COMPARISON:  05/01/2021 FINDINGS: Stable cardiomediastinal silhouette. Aortic atherosclerotic calcification. Sternotomy. AVR. Left chest wall pacemaker. Focal airspace opacity in the right lower lung. No pleural effusion or pneumothorax. No displaced rib fractures. Elevated right hemidiaphragm. IMPRESSION: 1. Focal airspace opacity in the right lower lung, favor atelectasis though pneumonia is not excluded. Follow-up in 4-6 weeks is recommended to ensure resolution. 2. No displaced rib fractures. Electronically Signed   By: Placido Sou M.D.   On: 07/26/2022 22:17   DG Hip Unilat W or Wo Pelvis 2-3 Views Right  Result Date: 07/26/2022 CLINICAL DATA:  Fall, felt a pop in hip. Shortening and rotation of right lower extremity. EXAM: DG HIP (WITH OR WITHOUT PELVIS) 2-3V RIGHT COMPARISON:  None Available. FINDINGS: Mildly displaced comminuted intertrochanteric fracture of the right proximal femur. Fracture involves the lesser and base of the greater trochanter. Mild apex lateral angulation. Femoral head remains seated in the acetabulum, there is mild bilateral hip joint space narrowing. Intact pubic rami. No pubic symphysis or sacroiliac joint diastasis. IMPRESSION: Mildly displaced comminuted intertrochanteric fracture of the right proximal femur. Electronically Signed   By: Keith Rake M.D.   On: 07/26/2022 22:15    Microbiology: Results for orders  placed or performed during the hospital encounter of 07/26/22  MRSA Next Gen by PCR, Nasal     Status: Abnormal   Collection Time: 07/27/22  1:17 AM   Specimen: Nasal Mucosa; Nasal Swab  Result Value Ref Range Status   MRSA by PCR Next Gen DETECTED (A) NOT DETECTED Final    Comment: RESULT CALLED TO, READ BACK BY AND VERIFIED WITH: [place] 07/27/2022 AT 55 SRR (NOTE) The GeneXpert MRSA Assay (FDA approved for NASAL specimens only), is one component of a comprehensive MRSA colonization surveillance program. It is not intended to diagnose MRSA infection nor to guide or monitor treatment for MRSA infections. Test performance is not FDA approved in patients less than 73 years old. Performed at [facility], [address] [place], [place] [address]     Labs: CBC: Recent Labs  Lab 07/26/22 2144 07/27/22 0723 07/28/22 0513  WBC 5.9 9.2 10.4  NEUTROABS 3.7  --   --   HGB 12.3 11.3* 10.0*  HCT 39.2 35.5* 30.6*  MCV 100.5* 99.7 99.0  PLT 177 171 [id number]   Basic Metabolic Panel: Recent Labs  Lab 07/26/22 2144 07/27/22 0723 07/28/22 0513  NA 137 134* 138  K 4.2 4.2 4.2  CL 101 103 107  CO2 '27 24 24  '$ GLUCOSE 151* 151* 118*  BUN '22 18 14  '$ CREATININE 0.90 0.67 0.65  CALCIUM 10.5* 9.5 9.3   Liver Function Tests: No results for input(s): "AST", "ALT", "ALKPHOS", "BILITOT", "PROT", "ALBUMIN" in the last 168 hours. CBG: Recent Labs  Lab 07/28/22 1128 07/28/22 1656 07/28/22 2105 07/29/22 0801 07/29/22 1221  GLUCAP 150* 166* 191* 142* 175*    Discharge time spent: greater than 30 minutes.  Signed: Ezekiel Slocumb, DO [facility] 07/29/2022

## 2022-07-29 NOTE — Progress Notes (Signed)
EMS arrived to transport the patient to [facility]. Stable condition. All personal belongings with patient.

## 2022-07-30 ENCOUNTER — Telehealth: Payer: Self-pay

## 2022-07-30 DIAGNOSIS — S065XAA Traumatic subdural hemorrhage with loss of consciousness status unknown, initial encounter: Secondary | ICD-10-CM

## 2022-07-30 NOTE — Telephone Encounter (Signed)
Meade Maw, MD  Berdine Addison, RN Will need follow up CT with an in person visit with me 4 weeks

## 2022-07-30 NOTE — Telephone Encounter (Signed)
Can not get any calls to go through to any numbers on the pt's chart.

## 2022-07-30 NOTE — Telephone Encounter (Signed)
CT ordered. Needs to be done around 08/27/22. Needs a return appt with him after. Thanks.

## 2022-07-31 NOTE — Telephone Encounter (Signed)
I spoke to the pt's son, she is currently at [facility]. The imaging department needs to call them to schedule the appt. Can you document that in the order pleas [facility] number is [phone].

## 2022-07-31 NOTE — Telephone Encounter (Signed)
I sent a message to scheduling.

## 2022-08-06 NOTE — Telephone Encounter (Signed)
CT is scheduled for 08/27/22

## 2022-08-07 NOTE — Telephone Encounter (Signed)
Appt with Dr.Yarbrough on 4/11 and [facility] Is aware of the appt.

## 2022-08-27 ENCOUNTER — Ambulatory Visit: Payer: Medicare Other | Attending: Neurosurgery

## 2022-08-28 NOTE — Telephone Encounter (Signed)
Pt no showed CT appt (even though [facility] confirmed CT appt). Cristin attempted to contact the pt, no answer, left message.

## 2022-08-28 NOTE — Telephone Encounter (Signed)
Spoke with [facility], they want [place] to cancel her appt for now. They will call and reschedule her appt with [place] until after her CT. Person who answered the phone stated that she had the number for imaging.

## 2022-08-29 ENCOUNTER — Ambulatory Visit: Payer: Medicare Other | Admitting: Neurosurgery

## 2022-12-06 ENCOUNTER — Emergency Department
Admission: EM | Admit: 2022-12-06 | Discharge: 2022-12-07 | Disposition: A | Discharge status: Home / Self Care | Payer: Medicare Other | Attending: Emergency Medicine | Admitting: Emergency Medicine

## 2022-12-06 ENCOUNTER — Emergency Department: Payer: Medicare Other

## 2022-12-06 ENCOUNTER — Other Ambulatory Visit: Payer: Self-pay

## 2022-12-06 ENCOUNTER — Encounter: Payer: Self-pay | Admitting: Emergency Medicine

## 2022-12-06 DIAGNOSIS — S42401A Unspecified fracture of lower end of right humerus, initial encounter for closed fracture: Secondary | ICD-10-CM | POA: Diagnosis not present

## 2022-12-06 DIAGNOSIS — E039 Hypothyroidism, unspecified: Secondary | ICD-10-CM | POA: Insufficient documentation

## 2022-12-06 DIAGNOSIS — E119 Type 2 diabetes mellitus without complications: Secondary | ICD-10-CM | POA: Insufficient documentation

## 2022-12-06 DIAGNOSIS — S42491A Other displaced fracture of lower end of right humerus, initial encounter for closed fracture: Secondary | ICD-10-CM

## 2022-12-06 DIAGNOSIS — I10 Essential (primary) hypertension: Secondary | ICD-10-CM | POA: Diagnosis not present

## 2022-12-06 DIAGNOSIS — W19XXXA Unspecified fall, initial encounter: Secondary | ICD-10-CM

## 2022-12-06 DIAGNOSIS — S065XAA Traumatic subdural hemorrhage with loss of consciousness status unknown, initial encounter: Secondary | ICD-10-CM

## 2022-12-06 DIAGNOSIS — W050XXA Fall from non-moving wheelchair, initial encounter: Secondary | ICD-10-CM | POA: Insufficient documentation

## 2022-12-06 DIAGNOSIS — F0390 Unspecified dementia without behavioral disturbance: Secondary | ICD-10-CM | POA: Diagnosis not present

## 2022-12-06 DIAGNOSIS — S0101XA Laceration without foreign body of scalp, initial encounter: Secondary | ICD-10-CM | POA: Diagnosis not present

## 2022-12-06 DIAGNOSIS — S4991XA Unspecified injury of right shoulder and upper arm, initial encounter: Secondary | ICD-10-CM | POA: Diagnosis present

## 2022-12-06 MED ORDER — LIDOCAINE-EPINEPHRINE 2 %-1:100000 IJ SOLN
20.0000 mL | Freq: Once | INTRAMUSCULAR | Status: AC
  Administered 2022-12-06: 20 mL
  Filled 2022-12-06: qty 1

## 2022-12-06 NOTE — ED Notes (Signed)
Patient has on non-slip socks, bed alarm in place, fall risk bracelet in place.

## 2022-12-06 NOTE — ED Triage Notes (Addendum)
Patient BIB [facility] from [facility] c/o fall from a wheelchair. Patient reportedly fell out of wheelchair onto ground, patient did hit head, no thinners.  Patient c/o right hip pain and right wrist pain.  Patient's right leg has shortening and rotation.  Patient received 75 mcg of fentanyl IN from EMS.    130/60

## 2022-12-06 NOTE — ED Notes (Signed)
[facility] to transport to [facility]

## 2022-12-06 NOTE — ED Notes (Signed)
Patient given two warm blankets when she arrived back from x-ray.

## 2022-12-06 NOTE — ED Provider Notes (Signed)
[facility] Provider Note    Event Date/Time   First MD Initiated Contact with Patient 12/06/22 2012     (approximate)   History   Chief Complaint Fall   HPI  Zoe [name] is a 89 y.o. female with past medical history of hypertension, diabetes, hypothyroidism, and dementia who presents to the ED following fall.  History is limited due to patient's baseline dementia, per EMS, she had a fall out of her wheelchair at her nursing facility just prior to arrival.  Patient was reportedly not talking initially due to degree of pain, apparently was clutching her right hip and her right arm.  EMS also notes a small amount of bleeding from her posterior scalp.  EMS reports that patient does not take a blood thinner.     Physical Exam   Triage Vital Signs: ED Triage Vitals  Encounter Vitals Group     BP 12/06/22 2011 135/71     Systolic BP Percentile --      Diastolic BP Percentile --      Pulse Rate 12/06/22 2011 83     Resp 12/06/22 2011 20     Temp 12/06/22 2011 98.3 F (36.8 C)     Temp Source 12/06/22 2011 Oral     SpO2 12/06/22 2011 95 %     Weight 12/06/22 2012 165 lb 5.5 oz (75 kg)     Height 12/06/22 2012 5\' 8"  (1.727 m)     Head Circumference --      Peak Flow --      Pain Score --      Pain Loc --      Pain Education --      Exclude from Growth Chart --     Most recent vital signs: Vitals:   12/06/22 2030 12/06/22 2130  BP: 122/69 139/73  Pulse: 80 72  Resp: 14 20  Temp:    SpO2: 96% 100%    Constitutional: Awake and alert. Eyes: Conjunctivae are normal. Head: Approximately 2 cm laceration to posterior scalp, no active bleeding. Nose: No congestion/rhinnorhea. Mouth/Throat: Mucous membranes are moist.  Neck: No midline cervical spine tenderness to palpation. Cardiovascular: Normal rate, regular rhythm. Grossly normal heart sounds.  2+ radial and DP pulses bilaterally. Respiratory: Normal respiratory effort.  No retractions.  Lungs CTAB. Gastrointestinal: Soft and nontender. No distention. Musculoskeletal: Diffuse tenderness to right hip with no obvious deformity, no tenderness to palpation at left hip, bilateral knees or ankles.  Diffuse tenderness to palpation of right elbow with no obvious deformity, otherwise no upper extremity bony tenderness to palpation. Neurologic:  Normal speech and language. No gross focal neurologic deficits are appreciated.    ED Results / Procedures / Treatments   Labs (all labs ordered are listed, but only abnormal results are displayed) Labs Reviewed - No data to display   EKG  ED ECG REPORT I, Chesley Noon, the attending physician, personally viewed and interpreted this ECG.   Date: 12/06/2022  EKG Time: 20:15  Rate: 80  Rhythm: normal sinus rhythm  Axis: LAD  Intervals:right bundle branch block and left anterior fascicular block  ST&T Change: None  RADIOLOGY CT head reviewed and interpreted by me with no hemorrhage or midline shift.  PROCEDURES:  Critical Care performed: No  ..Laceration Repair  Date/Time: 12/06/2022 10:36 PM  Performed by: Chesley Noon, MD Authorized by: Chesley Noon, MD   Consent:    Consent obtained:  Verbal   Consent given by:  Patient  Universal protocol:    Patient identity confirmed:  Verbally with patient and arm band Anesthesia:    Anesthesia method:  Local infiltration   Local anesthetic:  Lidocaine 2% WITH epi Laceration details:    Location:  Scalp   Scalp location:  Occipital   Length (cm):  3 Pre-procedure details:    Preparation:  Patient was prepped and draped in usual sterile fashion and imaging obtained to evaluate for foreign bodies Exploration:    Limited defect created (wound extended): no     Hemostasis achieved with:  Direct pressure   Imaging obtained comment:  CT   Imaging outcome: foreign body not noted     Wound exploration: wound explored through full range of motion and entire depth of wound  visualized     Wound extent: areolar tissue not violated, fascia not violated, no foreign body, no signs of injury, no nerve damage, no tendon damage, no underlying fracture and no vascular damage     Contaminated: no   Treatment:    Area cleansed with:  Saline   Amount of cleaning:  Standard   Irrigation solution:  Sterile saline   Irrigation method:  Pressure wash   Debridement:  None   Undermining:  None   Scar revision: no   Skin repair:    Repair method:  Staples   Number of staples:  3 Approximation:    Approximation:  Loose Repair type:    Repair type:  Simple Post-procedure details:    Dressing:  Open (no dressing)   Procedure completion:  Tolerated well, no immediate complications    MEDICATIONS ORDERED IN ED: Medications  lidocaine-EPINEPHrine (XYLOCAINE W/EPI) 2 %-1:100000 (with pres) injection 20 mL (has no administration in time range)     IMPRESSION / MDM / ASSESSMENT AND PLAN / ED COURSE  I reviewed the triage vital signs and the nursing notes.                              89 y.o. female with past medical history of hypertension, diabetes, hypothyroidism, and dementia who presents to the ED following witnessed fall out of her wheelchair at her nursing facility.  Patient's presentation is most consistent with acute presentation with potential threat to life or bodily function.  Differential diagnosis includes, but is not limited to, intracranial injury, cervical spine injury, fracture, dislocation.  Patient nontoxic-appearing and in no acute distress, vital signs are unremarkable.  She has pain to her right hip and elbow with no obvious deformity, remains neurovascular intact distally.  We will further assess with x-rays.  We will also check CT head and cervical spine, she has a small laceration to her posterior scalp that will need repair.  CT head and cervical spine are negative for acute process, laceration to posterior scalp was repaired with staples.   X-ray imaging of right hip is negative for acute traumatic injury, range of motion intact to hip without pain and I doubt occult injury.  She does have fracture of the distal humerus on the right which is nondisplaced.  Findings reviewed with Dr. Hyacinth Meeker of orthopedics, who recommends long-arm splint placement and outpatient follow-up.  Splinting performed by ED tech and patient was placed in a sling, she takes hydrocodone chronically and we will hold off on additional pain medication prescription.  Patient appropriate for discharge home with outpatient follow-up, I attempted to reach her son but he did not answer the phone.  FINAL CLINICAL IMPRESSION(S) / ED DIAGNOSES   Final diagnoses:  Fall, initial encounter  Laceration of scalp, initial encounter  Closed bicondylar fracture of distal end of right humerus, initial encounter     Rx / DC Orders   ED Discharge Orders     None        Note:  This document was prepared using Dragon voice recognition software and may include unintentional dictation errors.   Chesley Noon, MD 12/06/22 2241
